# Patient Record
Sex: Female | Born: 1940 | Race: White | Hispanic: No | Marital: Married | Smoking: Never smoker
Health system: Southern US, Community
[De-identification: ages and names within clinical notes are randomized; demographics above are authoritative.]

## PROBLEM LIST (undated history)

## (undated) DIAGNOSIS — D649 Anemia, unspecified: Secondary | ICD-10-CM

## (undated) DIAGNOSIS — R112 Nausea with vomiting, unspecified: Secondary | ICD-10-CM

## (undated) DIAGNOSIS — K56609 Unspecified intestinal obstruction, unspecified as to partial versus complete obstruction: Secondary | ICD-10-CM

## (undated) DIAGNOSIS — C569 Malignant neoplasm of unspecified ovary: Secondary | ICD-10-CM

## (undated) DIAGNOSIS — M1990 Unspecified osteoarthritis, unspecified site: Secondary | ICD-10-CM

## (undated) DIAGNOSIS — H409 Unspecified glaucoma: Secondary | ICD-10-CM

## (undated) DIAGNOSIS — I729 Aneurysm of unspecified site: Secondary | ICD-10-CM

## (undated) DIAGNOSIS — R51 Headache: Secondary | ICD-10-CM

## (undated) DIAGNOSIS — M797 Fibromyalgia: Secondary | ICD-10-CM

## (undated) DIAGNOSIS — K219 Gastro-esophageal reflux disease without esophagitis: Secondary | ICD-10-CM

## (undated) DIAGNOSIS — R519 Headache, unspecified: Secondary | ICD-10-CM

## (undated) DIAGNOSIS — K5730 Diverticulosis of large intestine without perforation or abscess without bleeding: Secondary | ICD-10-CM

## (undated) DIAGNOSIS — G8929 Other chronic pain: Secondary | ICD-10-CM

## (undated) DIAGNOSIS — Z9889 Other specified postprocedural states: Secondary | ICD-10-CM

## (undated) DIAGNOSIS — I10 Essential (primary) hypertension: Secondary | ICD-10-CM

## (undated) HISTORY — DX: Other chronic pain: G89.29

## (undated) HISTORY — PX: ABDOMINAL HYSTERECTOMY: SHX81

## (undated) HISTORY — DX: Anemia, unspecified: D64.9

## (undated) HISTORY — PX: HERNIA REPAIR: SHX51

## (undated) HISTORY — DX: Headache, unspecified: R51.9

## (undated) HISTORY — DX: Diverticulosis of large intestine without perforation or abscess without bleeding: K57.30

## (undated) HISTORY — DX: Malignant neoplasm of unspecified ovary: C56.9

## (undated) HISTORY — DX: Headache: R51

## (undated) HISTORY — DX: Unspecified glaucoma: H40.9

## (undated) HISTORY — DX: Aneurysm of unspecified site: I72.9

## (undated) HISTORY — DX: Fibromyalgia: M79.7

## (undated) HISTORY — PX: BRAIN SURGERY: SHX531

## (undated) HISTORY — DX: Unspecified osteoarthritis, unspecified site: M19.90

## (undated) HISTORY — PX: CHOLECYSTECTOMY: SHX55

---
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
## 1999-09-09 HISTORY — PX: COLONOSCOPY W/ BIOPSIES: SHX1374

## 2001-09-29 ENCOUNTER — Encounter: Payer: Self-pay | Admitting: Emergency Medicine

## 2001-09-29 ENCOUNTER — Emergency Department (HOSPITAL_COMMUNITY): Admission: EM | Admit: 2001-09-29 | Discharge: 2001-09-29 | Payer: Self-pay | Admitting: Emergency Medicine

## 2008-04-02 DIAGNOSIS — K5730 Diverticulosis of large intestine without perforation or abscess without bleeding: Secondary | ICD-10-CM

## 2008-04-02 HISTORY — DX: Diverticulosis of large intestine without perforation or abscess without bleeding: K57.30

## 2008-04-02 HISTORY — PX: COLONOSCOPY: SHX174

## 2008-08-09 ENCOUNTER — Ambulatory Visit: Payer: Self-pay | Admitting: Rheumatology

## 2011-01-07 ENCOUNTER — Other Ambulatory Visit: Payer: Self-pay | Admitting: Neurology

## 2011-01-07 DIAGNOSIS — R2681 Unsteadiness on feet: Secondary | ICD-10-CM

## 2011-01-14 ENCOUNTER — Ambulatory Visit
Admission: RE | Admit: 2011-01-14 | Discharge: 2011-01-14 | Disposition: A | Discharge status: Home / Self Care | Payer: Medicare Other | Source: Ambulatory Visit | Attending: Neurology | Admitting: Neurology

## 2011-01-14 DIAGNOSIS — R2681 Unsteadiness on feet: Secondary | ICD-10-CM

## 2011-01-14 MED ORDER — GADOBENATE DIMEGLUMINE 529 MG/ML IV SOLN
19.0000 mL | Freq: Once | INTRAVENOUS | Status: AC | PRN
  Administered 2011-01-14: 19 mL via INTRAVENOUS

## 2011-01-15 ENCOUNTER — Other Ambulatory Visit: Payer: Self-pay | Admitting: Neurology

## 2011-01-15 DIAGNOSIS — I729 Aneurysm of unspecified site: Secondary | ICD-10-CM

## 2011-01-15 DIAGNOSIS — I671 Cerebral aneurysm, nonruptured: Secondary | ICD-10-CM

## 2011-01-18 ENCOUNTER — Ambulatory Visit
Admission: RE | Admit: 2011-01-18 | Discharge: 2011-01-18 | Disposition: A | Discharge status: Home / Self Care | Payer: Medicare Other | Source: Ambulatory Visit | Attending: Neurology | Admitting: Neurology

## 2011-01-18 DIAGNOSIS — I671 Cerebral aneurysm, nonruptured: Secondary | ICD-10-CM

## 2011-01-20 ENCOUNTER — Other Ambulatory Visit (HOSPITAL_COMMUNITY): Payer: Self-pay | Admitting: Neurology

## 2011-01-20 DIAGNOSIS — I729 Aneurysm of unspecified site: Secondary | ICD-10-CM

## 2011-01-23 ENCOUNTER — Ambulatory Visit (HOSPITAL_COMMUNITY)
Admission: RE | Admit: 2011-01-23 | Discharge: 2011-01-23 | Disposition: A | Discharge status: Home / Self Care | Payer: Medicare Other | Source: Ambulatory Visit | Attending: Neurology | Admitting: Neurology

## 2011-01-23 DIAGNOSIS — I729 Aneurysm of unspecified site: Secondary | ICD-10-CM

## 2011-01-30 ENCOUNTER — Other Ambulatory Visit (HOSPITAL_COMMUNITY): Payer: Self-pay | Admitting: Interventional Radiology

## 2011-01-30 DIAGNOSIS — I729 Aneurysm of unspecified site: Secondary | ICD-10-CM

## 2011-02-12 ENCOUNTER — Ambulatory Visit (HOSPITAL_COMMUNITY)
Admission: RE | Admit: 2011-02-12 | Discharge: 2011-02-12 | Disposition: A | Discharge status: Home / Self Care | Payer: Medicare Other | Source: Ambulatory Visit | Attending: Interventional Radiology | Admitting: Interventional Radiology

## 2011-02-12 ENCOUNTER — Encounter (HOSPITAL_COMMUNITY)
Admission: RE | Admit: 2011-02-12 | Discharge: 2011-02-12 | Disposition: A | Discharge status: Home / Self Care | Payer: Medicare Other | Source: Ambulatory Visit | Attending: Interventional Radiology | Admitting: Interventional Radiology

## 2011-02-12 ENCOUNTER — Other Ambulatory Visit (HOSPITAL_COMMUNITY): Payer: Self-pay | Admitting: Interventional Radiology

## 2011-02-12 DIAGNOSIS — Z01818 Encounter for other preprocedural examination: Secondary | ICD-10-CM | POA: Insufficient documentation

## 2011-02-12 DIAGNOSIS — R52 Pain, unspecified: Secondary | ICD-10-CM

## 2011-02-12 DIAGNOSIS — Z01812 Encounter for preprocedural laboratory examination: Secondary | ICD-10-CM | POA: Insufficient documentation

## 2011-02-12 LAB — PROTIME-INR
INR: 0.9 (ref 0.00–1.49)
Prothrombin Time: 12.4 seconds (ref 11.6–15.2)

## 2011-02-12 LAB — DIFFERENTIAL
Basophils Absolute: 0 10*3/uL (ref 0.0–0.1)
Basophils Relative: 1 % (ref 0–1)
Eosinophils Relative: 6 % — ABNORMAL HIGH (ref 0–5)
Monocytes Absolute: 0.4 10*3/uL (ref 0.1–1.0)
Neutro Abs: 3.4 10*3/uL (ref 1.7–7.7)

## 2011-02-12 LAB — BASIC METABOLIC PANEL
Calcium: 9.6 mg/dL (ref 8.4–10.5)
GFR calc Af Amer: >60 mL/min (ref >60)
GFR calc non Af Amer: >60 mL/min (ref >60)
Sodium: 138 mEq/L (ref 135–145)

## 2011-02-12 LAB — CBC
Hemoglobin: 14 g/dL (ref 12.0–15.0)
MCHC: 32.8 g/dL (ref 30.0–36.0)
RDW: 14.1 % (ref 11.5–15.5)

## 2011-02-12 LAB — APTT: aPTT: 28 seconds (ref 24–37)

## 2011-02-18 ENCOUNTER — Ambulatory Visit (HOSPITAL_COMMUNITY)
Admission: RE | Admit: 2011-02-18 | Discharge: 2011-02-18 | Disposition: A | Discharge status: Home / Self Care | Payer: Medicare Other | Source: Ambulatory Visit | Attending: Interventional Radiology | Admitting: Interventional Radiology

## 2011-02-18 ENCOUNTER — Inpatient Hospital Stay (HOSPITAL_COMMUNITY)
Admission: RE | Admit: 2011-02-18 | Discharge: 2011-02-19 | DRG: 027 | Disposition: A | Discharge status: Home / Self Care | Payer: Medicare Other | Source: Ambulatory Visit | Attending: Interventional Radiology | Admitting: Interventional Radiology

## 2011-02-18 DIAGNOSIS — Z8543 Personal history of malignant neoplasm of ovary: Secondary | ICD-10-CM

## 2011-02-18 DIAGNOSIS — I10 Essential (primary) hypertension: Secondary | ICD-10-CM | POA: Diagnosis present

## 2011-02-18 DIAGNOSIS — I671 Cerebral aneurysm, nonruptured: Principal | ICD-10-CM | POA: Diagnosis present

## 2011-02-18 DIAGNOSIS — K219 Gastro-esophageal reflux disease without esophagitis: Secondary | ICD-10-CM | POA: Diagnosis present

## 2011-02-18 DIAGNOSIS — I729 Aneurysm of unspecified site: Secondary | ICD-10-CM

## 2011-02-18 DIAGNOSIS — IMO0001 Reserved for inherently not codable concepts without codable children: Secondary | ICD-10-CM | POA: Diagnosis present

## 2011-02-18 DIAGNOSIS — Z96659 Presence of unspecified artificial knee joint: Secondary | ICD-10-CM

## 2011-02-18 LAB — CBC
MCH: 30.1 pg (ref 26.0–34.0)
MCV: 91.3 fL (ref 78.0–100.0)
Platelets: 187 10*3/uL (ref 150–400)
RDW: 13.8 % (ref 11.5–15.5)

## 2011-02-18 MED ORDER — IOHEXOL 300 MG/ML  SOLN: 150.0000 mL | Freq: Once | INTRAMUSCULAR | Status: DC | PRN

## 2011-02-18 MED ORDER — IOHEXOL 300 MG/ML  SOLN
400.0000 mL | Freq: Once | INTRAMUSCULAR | Status: AC | PRN
  Administered 2011-02-18: 140 mL via INTRA_ARTERIAL

## 2011-02-19 LAB — BASIC METABOLIC PANEL
BUN: 10 mg/dL (ref 6–23)
CO2: 26 mEq/L (ref 19–32)
Calcium: 7.8 mg/dL — ABNORMAL LOW (ref 8.4–10.5)
Creatinine, Ser: 0.7 mg/dL (ref 0.4–1.2)
GFR calc Af Amer: >60 mL/min (ref >60)

## 2011-02-19 LAB — PROTIME-INR: INR: 0.84 (ref 0.00–1.49)

## 2011-02-19 LAB — APTT: aPTT: 33 seconds (ref 24–37)

## 2011-02-19 LAB — POCT ACTIVATED CLOTTING TIME
Activated Clotting Time: 134 seconds
Activated Clotting Time: 164 seconds
Activated Clotting Time: 181 seconds

## 2011-02-19 LAB — CBC
MCHC: 32.5 g/dL (ref 30.0–36.0)
RDW: 13.7 % (ref 11.5–15.5)

## 2011-03-02 NOTE — Discharge Summary (Signed)
  Tonya Miranda, DRY           ACCOUNT NO.:  [id number]  MEDICAL RECORD NO.:  [id number]           PATIENT TYPE:  I  LOCATION:  3108                         FACILITY:  [facility]  PHYSICIAN:  Zaya Kessenich K. Klaus Casteneda, M.D.DATE OF BIRTH:  09/30/1941  DATE OF ADMISSION:  02/18/2011 DATE OF DISCHARGE:  02/19/2011                              DISCHARGE SUMMARY   REASON FOR HOSPITALIZATION:  Left internal carotid artery aneurysm.  The patient was referred to [name] by [name] for evaluation and treatment.  TREATMENT:  Left internal carotid artery aneurysm stent/coiling, performed by [name] on February 18, 2011.  Admitted to 3100 Neuro ICU overnight.  The patient's stay was without complication.  Morning of discharge, the patient stated she did have small or mild headache.  The patient states probably secondary to not sleeping well.  She is eating and drinking well without complications.  No nausea.  No vomiting.  No other complaints.  No other pain.  OBJECTIVE:  VITAL SIGNS:  Stable. HEART:  Regular rate and rhythm without murmur. LUNGS:  Clear to auscultation. ABDOMEN:  Positive bowel sounds, nontender, no masses. GU:  Right groin, no bleeding, no hematoma, no sign of infection.  LABS:  All within normal limits; potassium was 3.3 which was repleted.  DISCHARGE MEDICATIONS: 1. Advil 200 mg. 2. Beclomethasone nasal spray. 3. Calcium over-the-counter. 4. Ciclopirox. 5. Gabapentin 300 mg. 6. Multivitamins. 7. Omega-3 fish oil. 8. Prilosec 20 mg. 9. Tylenol. 10.Vitamin D3 2000 units. 11.Zyrtec 10 mg  INSTRUCTIONS: 1. Restful for 2-3 days. 2. No lifting for 2 weeks. 3. No driving for 2 weeks. 4. Continue all home medications.  DISCHARGE CONDITION:  Stable.  FOLLOWUP APPOINTMENT:  March 04, 2011 at 2 p.m. at [facility] with Dr. Julieanne Cotton.     [place] [name], P.A.   ______________________________ Grandville Silos Corliss Skains, M.D.    PAT/MEDQ  D:   02/19/2011  T:  02/20/2011  Job:  [id number]  Electronically Signed by Ralene Muskrat P.A. on 02/26/2011 11:40:37 AM Electronically Signed by Julieanne Cotton M.D. on 03/02/2011 01:45:50 PM

## 2011-03-02 NOTE — H&P (Signed)
  Tonya Miranda, Tonya Miranda           ACCOUNT NO.:  [id number]  MEDICAL RECORD NO.:  [id number]           PATIENT TYPE:  I  LOCATION:  3108                         FACILITY:  [facility]  PHYSICIAN:  Jemario Poitras K. Romen Yutzy, M.D.DATE OF BIRTH:  October 05, 1941  DATE OF ADMISSION:  02/18/2011 DATE OF DISCHARGE:                             HISTORY & PHYSICAL   SUBJECTIVE:  A 70 year old female patient of [name] referred to [name] for evaluation and treatment of cerebral aneurysm.  Consulted on January 26, 2011.  The patient now scheduled for cerebral arteriogram and probable stent/coiling of left internal carotid artery aneurysm.  The patient aware of procedure benefits and risks.  Agreeable to proceed.  PAST MEDICAL HISTORY: 1. Hypertension. 2. Reflux disease. 3. Ovarian cancer. 4. Stomach ulcers. 5. Cluster headaches. 6. Irritable bowel syndrome. 7. Fibromyalgia. 8. History of syncope.  SURGICAL HISTORY: 1. Cholecystectomy. 2. Hysterectomy. 3. Hernia repair. 4. Right knee replacement.  ALLERGIES:  ADHESIVE TAPE.  MEDICATIONS: 1. Prilosec 20 mg. 2. Zyrtec 10 mg. 3. Vitamin D3 2000 units. 4. Omega-3 fish oil. 5. Vitamin supplements. 6. Advil and Tylenol. 7. Calcium. 8. Gabapentin 300 mg. 9. Beclomethasone nasal spray. 10.Ciclopirox.  PHYSICAL EXAMINATION:  HEENT:  Extraocular movements intact; alert and oriented; appropriate.  Face symmetrical.  No facial droop.  Smile is equal bilaterally.  Tongue midline.  LUNGS:  Clear to auscultation.  HEART:  Regular rate and rhythm without murmur.  ABDOMEN:  Soft, positive bowel sounds and nontender.  EXTREMITIES:  Full range of motion; gait is slow.  ASSESSMENT:  Left internal carotid artery aneurysm.  PLAN:  Cerebral arteriogram and probable stent/coiling with [name].  Plan to admit to 3100 after procedure overnight.  Discharge on February 19, 2011 p.m. is stable.     Rogers [name],  P.A.   ______________________________ [place] Silos Corliss Skains, M.D.    PAT/MEDQ  D:  02/18/2011  T:  02/19/2011  Job:  [id number]  Electronically Signed by Ralene Muskrat P.A. on 02/26/2011 11:41:06 AM Electronically Signed by Julieanne Cotton M.D. on 03/02/2011 01:45:47 PM

## 2011-03-04 ENCOUNTER — Ambulatory Visit (HOSPITAL_COMMUNITY)
Admit: 2011-03-04 | Discharge: 2011-03-04 | Disposition: A | Discharge status: Home / Self Care | Payer: Medicare Other | Attending: Interventional Radiology | Admitting: Interventional Radiology

## 2011-07-27 ENCOUNTER — Other Ambulatory Visit (HOSPITAL_COMMUNITY): Payer: Self-pay | Admitting: Interventional Radiology

## 2011-07-27 DIAGNOSIS — I729 Aneurysm of unspecified site: Secondary | ICD-10-CM

## 2011-08-05 ENCOUNTER — Other Ambulatory Visit (HOSPITAL_COMMUNITY): Payer: Self-pay | Admitting: Interventional Radiology

## 2011-08-05 ENCOUNTER — Ambulatory Visit (HOSPITAL_COMMUNITY)
Admission: RE | Admit: 2011-08-05 | Discharge: 2011-08-05 | Disposition: A | Discharge status: Home / Self Care | Payer: Medicare Other | Source: Ambulatory Visit | Attending: Interventional Radiology | Admitting: Interventional Radiology

## 2011-08-05 DIAGNOSIS — R51 Headache: Secondary | ICD-10-CM | POA: Insufficient documentation

## 2011-08-05 DIAGNOSIS — I729 Aneurysm of unspecified site: Secondary | ICD-10-CM

## 2011-08-05 DIAGNOSIS — Z9889 Other specified postprocedural states: Secondary | ICD-10-CM | POA: Insufficient documentation

## 2011-08-05 DIAGNOSIS — I671 Cerebral aneurysm, nonruptured: Secondary | ICD-10-CM | POA: Insufficient documentation

## 2011-08-05 LAB — BASIC METABOLIC PANEL
BUN: 19 mg/dL (ref 6–23)
Calcium: 9.9 mg/dL (ref 8.4–10.5)
GFR calc non Af Amer: >60 mL/min (ref >60)
Glucose, Bld: 119 mg/dL — ABNORMAL HIGH (ref 70–99)
Sodium: 140 mEq/L (ref 135–145)

## 2011-08-05 LAB — CBC
MCH: 29.8 pg (ref 26.0–34.0)
MCHC: 33.5 g/dL (ref 30.0–36.0)
Platelets: 236 10*3/uL (ref 150–400)
RBC: 4.36 MIL/uL (ref 3.87–5.11)

## 2011-08-05 LAB — PROTIME-INR
INR: 0.9 (ref 0.00–1.49)
Prothrombin Time: 12.3 seconds (ref 11.6–15.2)

## 2011-08-05 MED ORDER — IOHEXOL 300 MG/ML  SOLN
150.0000 mL | Freq: Once | INTRAMUSCULAR | Status: AC | PRN
  Administered 2011-08-05: 50 mL via INTRA_ARTERIAL

## 2011-08-28 ENCOUNTER — Other Ambulatory Visit (HOSPITAL_COMMUNITY): Payer: Self-pay | Admitting: Interventional Radiology

## 2011-08-28 DIAGNOSIS — I729 Aneurysm of unspecified site: Secondary | ICD-10-CM

## 2011-08-28 DIAGNOSIS — Z09 Encounter for follow-up examination after completed treatment for conditions other than malignant neoplasm: Secondary | ICD-10-CM

## 2011-09-10 ENCOUNTER — Ambulatory Visit (HOSPITAL_COMMUNITY)
Admission: RE | Admit: 2011-09-10 | Discharge: 2011-09-10 | Disposition: A | Discharge status: Home / Self Care | Payer: Medicare Other | Source: Ambulatory Visit | Attending: Interventional Radiology | Admitting: Interventional Radiology

## 2011-09-10 DIAGNOSIS — Z09 Encounter for follow-up examination after completed treatment for conditions other than malignant neoplasm: Secondary | ICD-10-CM

## 2011-09-10 DIAGNOSIS — I729 Aneurysm of unspecified site: Secondary | ICD-10-CM

## 2011-11-26 ENCOUNTER — Other Ambulatory Visit (HOSPITAL_COMMUNITY): Payer: Self-pay | Admitting: Interventional Radiology

## 2011-11-26 DIAGNOSIS — Z09 Encounter for follow-up examination after completed treatment for conditions other than malignant neoplasm: Secondary | ICD-10-CM

## 2011-11-26 DIAGNOSIS — I671 Cerebral aneurysm, nonruptured: Secondary | ICD-10-CM

## 2011-12-24 ENCOUNTER — Ambulatory Visit (HOSPITAL_COMMUNITY)
Admission: RE | Admit: 2011-12-24 | Discharge: 2011-12-24 | Disposition: A | Discharge status: Home / Self Care | Payer: Medicare Other | Source: Ambulatory Visit | Attending: Interventional Radiology | Admitting: Interventional Radiology

## 2011-12-24 ENCOUNTER — Ambulatory Visit (HOSPITAL_COMMUNITY)
Admission: RE | Admit: 2011-12-24 | Discharge: 2011-12-24 | Payer: No Typology Code available for payment source | Source: Ambulatory Visit | Attending: Interventional Radiology | Admitting: Interventional Radiology

## 2011-12-24 DIAGNOSIS — I671 Cerebral aneurysm, nonruptured: Secondary | ICD-10-CM | POA: Insufficient documentation

## 2011-12-24 DIAGNOSIS — Z09 Encounter for follow-up examination after completed treatment for conditions other than malignant neoplasm: Secondary | ICD-10-CM

## 2011-12-24 MED ORDER — GADOBENATE DIMEGLUMINE 529 MG/ML IV SOLN
19.0000 mL | Freq: Once | INTRAVENOUS | Status: AC
  Administered 2011-12-24: 19 mL via INTRAVENOUS

## 2012-01-07 HISTORY — PX: ESOPHAGOGASTRODUODENOSCOPY: SHX1529

## 2012-04-05 ENCOUNTER — Telehealth (HOSPITAL_COMMUNITY): Payer: Self-pay

## 2012-04-05 NOTE — Telephone Encounter (Signed)
Tonya Miranda called in and stated that [name] told her it was time for her f/u w/ [name].  The pt did not remember having MRI/Mra in 12-24-11.  I refaxed a copy of the reports to [name] office and also called the office to make them aware that [name] had already completed a f/u procedure.  I also made them aware that he had stated that the next f/u mri/mra would be in Jan. 2014.

## 2012-05-31 ENCOUNTER — Other Ambulatory Visit: Payer: Self-pay | Admitting: Neurosurgery

## 2012-08-03 ENCOUNTER — Encounter (HOSPITAL_COMMUNITY): Payer: Self-pay | Admitting: Pharmacy Technician

## 2012-08-05 ENCOUNTER — Encounter (HOSPITAL_COMMUNITY): Payer: Self-pay

## 2012-08-05 ENCOUNTER — Ambulatory Visit (HOSPITAL_COMMUNITY)
Admission: RE | Admit: 2012-08-05 | Discharge: 2012-08-05 | Disposition: A | Discharge status: Home / Self Care | Payer: Medicare Other | Source: Ambulatory Visit | Attending: Neurosurgery | Admitting: Neurosurgery

## 2012-08-05 ENCOUNTER — Encounter (HOSPITAL_COMMUNITY)
Admission: RE | Admit: 2012-08-05 | Discharge: 2012-08-05 | Disposition: A | Discharge status: Home / Self Care | Payer: Medicare Other | Source: Ambulatory Visit | Attending: Neurosurgery | Admitting: Neurosurgery

## 2012-08-05 DIAGNOSIS — Z09 Encounter for follow-up examination after completed treatment for conditions other than malignant neoplasm: Secondary | ICD-10-CM | POA: Insufficient documentation

## 2012-08-05 DIAGNOSIS — Z9889 Other specified postprocedural states: Secondary | ICD-10-CM | POA: Insufficient documentation

## 2012-08-05 HISTORY — DX: Essential (primary) hypertension: I10

## 2012-08-05 HISTORY — DX: Gastro-esophageal reflux disease without esophagitis: K21.9

## 2012-08-05 HISTORY — DX: Nausea with vomiting, unspecified: R11.2

## 2012-08-05 HISTORY — DX: Other specified postprocedural states: R11.2

## 2012-08-05 HISTORY — DX: Other specified postprocedural states: Z98.890

## 2012-08-05 LAB — BASIC METABOLIC PANEL
BUN: 19 mg/dL (ref 6–23)
CO2: 24 mEq/L (ref 19–32)
Calcium: 10 mg/dL (ref 8.4–10.5)
Chloride: 104 mEq/L (ref 96–112)
Creatinine, Ser: 0.81 mg/dL (ref 0.50–1.10)
GFR calc Af Amer: 82 mL/min — ABNORMAL LOW (ref >90)
GFR calc non Af Amer: 71 mL/min — ABNORMAL LOW (ref >90)
Glucose, Bld: 86 mg/dL (ref 70–99)
Potassium: 4.5 mEq/L (ref 3.5–5.1)
Sodium: 139 mEq/L (ref 135–145)

## 2012-08-05 LAB — CBC
HCT: 36.1 % (ref 36.0–46.0)
Hemoglobin: 11.6 g/dL — ABNORMAL LOW (ref 12.0–15.0)
MCH: 26.9 pg (ref 26.0–34.0)
MCHC: 32.1 g/dL (ref 30.0–36.0)
MCV: 83.8 fL (ref 78.0–100.0)
Platelets: 296 10*3/uL (ref 150–400)
RBC: 4.31 MIL/uL (ref 3.87–5.11)
RDW: 17.3 % — ABNORMAL HIGH (ref 11.5–15.5)
WBC: 4.8 10*3/uL (ref 4.0–10.5)

## 2012-08-05 LAB — SURGICAL PCR SCREEN
MRSA, PCR: NEGATIVE
Staphylococcus aureus: NEGATIVE

## 2012-08-05 LAB — NO BLOOD PRODUCTS

## 2012-08-05 NOTE — Pre-Procedure Instructions (Signed)
20 KAMARIYAH TIMBERLAKE  08/05/2012  Your procedure is scheduled on:  August 10, 2012 at 0830 AM Wednesday   Report to [facility] at [phone].  Call this number if you have problems the morning of surgery: [phone]   Remember:   Do not eat food or drink any fluids:After Midnight.Tuesday      Take these medicines the morning of surgery with A SIP OF WATER: Tylenol,if needed, Zyrtec, Flonase nasal spray, bring with you,  And Protonix.     Do not wear jewelry, make-up or nail polish.  Do not wear lotions, powders, or perfumes. You may wear deodorant.  Do not shave 48 hours prior to surgery. Men may shave face and neck.  Do not bring valuables to the hospital.  Contacts, dentures or bridgework may not be worn into surgery.  Leave suitcase in the car. After surgery it may be brought to your room.  For patients admitted to the hospital, checkout time is 11:00 AM the day of discharge.   Patients discharged the day of surgery will not be allowed to drive home.    Special Instructions: CHG Shower Use Special Wash: 1/2 bottle night before surgery and 1/2 bottle morning of surgery.   Please read over the following fact sheets that you were given: Pain Booklet, Coughing and Deep Breathing, MRSA Information and Surgical Site Infection Prevention

## 2012-08-09 MED ORDER — CEFAZOLIN SODIUM-DEXTROSE 2-3 GM-% IV SOLR
2.0000 g | INTRAVENOUS | Status: AC
  Administered 2012-08-10: 2 g via INTRAVENOUS
  Filled 2012-08-09: qty 50

## 2012-08-09 NOTE — Consult Note (Signed)
Anesthesia chart: Patient is a 71 year old female scheduled for right retromastoid craniectomy and microvascular decompression for trigeminal neuralgia on 08/10/12 by Dr. Newell Coral.  History includes obesity, nonsmoker, postoperative nausea/vomiting, hypertension, GERD, diabetes mellitus type 2, ovarian cancer,OA, left ICA aneurysm s/p stent/coiling '12.  She is a Scientist, product/process development.    Labs noted.  H/H 11.6/36.1. NO BLOOD PRODUCTS.   CXR on 08/05/12 showed no active cardiopulmonary disease.  EKG on 08/05/12 showed NSR, possible LAE, LAD, LVH, cannot rule out septal infarct (age undetermined).  EKG was not felt significantly changed when compared to her EKG from 02/12/11.  (Possible anterior infarct was cited on prior EKG dating back to 09/29/01--see Muse.)  Anticipate she can proceed as planned.  Shonna Chock, PA-C

## 2012-08-10 ENCOUNTER — Ambulatory Visit (HOSPITAL_COMMUNITY): Payer: Medicare Other | Admitting: Vascular Surgery

## 2012-08-10 ENCOUNTER — Inpatient Hospital Stay (HOSPITAL_COMMUNITY): Payer: Medicare Other

## 2012-08-10 ENCOUNTER — Inpatient Hospital Stay (HOSPITAL_COMMUNITY)
Admission: RE | Admit: 2012-08-10 | Discharge: 2012-08-13 | DRG: 027 | Disposition: A | Discharge status: Home / Self Care | Payer: Medicare Other | Source: Ambulatory Visit | Attending: Neurosurgery | Admitting: Neurosurgery

## 2012-08-10 ENCOUNTER — Encounter (HOSPITAL_COMMUNITY): Payer: Self-pay | Admitting: *Deleted

## 2012-08-10 ENCOUNTER — Encounter (HOSPITAL_COMMUNITY): Payer: Self-pay | Admitting: Vascular Surgery

## 2012-08-10 ENCOUNTER — Encounter (HOSPITAL_COMMUNITY)
Admission: RE | Disposition: A | Discharge status: Home / Self Care | Payer: Self-pay | Source: Ambulatory Visit | Attending: Neurosurgery

## 2012-08-10 DIAGNOSIS — G500 Trigeminal neuralgia: Principal | ICD-10-CM | POA: Diagnosis present

## 2012-08-10 DIAGNOSIS — Z79899 Other long term (current) drug therapy: Secondary | ICD-10-CM

## 2012-08-10 DIAGNOSIS — Z8543 Personal history of malignant neoplasm of ovary: Secondary | ICD-10-CM

## 2012-08-10 DIAGNOSIS — E669 Obesity, unspecified: Secondary | ICD-10-CM | POA: Diagnosis present

## 2012-08-10 DIAGNOSIS — M1990 Unspecified osteoarthritis, unspecified site: Secondary | ICD-10-CM | POA: Diagnosis present

## 2012-08-10 DIAGNOSIS — K219 Gastro-esophageal reflux disease without esophagitis: Secondary | ICD-10-CM | POA: Diagnosis present

## 2012-08-10 DIAGNOSIS — I10 Essential (primary) hypertension: Secondary | ICD-10-CM | POA: Diagnosis present

## 2012-08-10 DIAGNOSIS — E119 Type 2 diabetes mellitus without complications: Secondary | ICD-10-CM | POA: Diagnosis present

## 2012-08-10 HISTORY — PX: CRANIECTOMY: SHX331

## 2012-08-10 LAB — GLUCOSE, CAPILLARY
Glucose-Capillary: 117 mg/dL — ABNORMAL HIGH (ref 70–99)
Glucose-Capillary: 155 mg/dL — ABNORMAL HIGH (ref 70–99)

## 2012-08-10 SURGERY — CRANIECTOMY FOR TIC DOULOUREUX
Anesthesia: General | Laterality: Right | Wound class: Clean

## 2012-08-10 MED ORDER — DEXAMETHASONE SODIUM PHOSPHATE 10 MG/ML IJ SOLN
6.0000 mg | Freq: Four times a day (QID) | INTRAMUSCULAR | Status: DC
  Administered 2012-08-10 – 2012-08-11 (×3): 6 mg via INTRAVENOUS
  Filled 2012-08-10 (×3): qty 0.6
  Filled 2012-08-10: qty 1
  Filled 2012-08-10: qty 0.6

## 2012-08-10 MED ORDER — HYDRALAZINE HCL 20 MG/ML IJ SOLN: 5.0000 mg | INTRAMUSCULAR | Status: DC | PRN

## 2012-08-10 MED ORDER — DIPHENHYDRAMINE HCL 25 MG PO TABS: 25.0000 mg | ORAL_TABLET | ORAL | Status: DC | PRN

## 2012-08-10 MED ORDER — PANTOPRAZOLE SODIUM 40 MG IV SOLR
40.0000 mg | Freq: Every day | INTRAVENOUS | Status: DC
  Administered 2012-08-10 – 2012-08-11 (×2): 40 mg via INTRAVENOUS
  Filled 2012-08-10 (×3): qty 40

## 2012-08-10 MED ORDER — SODIUM CHLORIDE 0.9 % IV SOLN: INTRAVENOUS | Status: DC

## 2012-08-10 MED ORDER — LISINOPRIL 10 MG PO TABS
10.0000 mg | ORAL_TABLET | Freq: Every day | ORAL | Status: DC
  Administered 2012-08-11 – 2012-08-13 (×3): 10 mg via ORAL
  Filled 2012-08-10 (×3): qty 1

## 2012-08-10 MED ORDER — PHENYLEPHRINE HCL 10 MG/ML IJ SOLN
10.0000 mg | INTRAVENOUS | Status: DC | PRN
  Administered 2012-08-10: 1 ug/min via INTRAVENOUS

## 2012-08-10 MED ORDER — BISACODYL 10 MG RE SUPP: 10.0000 mg | Freq: Every day | RECTAL | Status: DC | PRN

## 2012-08-10 MED ORDER — MIDAZOLAM HCL 5 MG/5ML IJ SOLN
INTRAMUSCULAR | Status: DC | PRN
  Administered 2012-08-10 (×2): 1 mg via INTRAVENOUS

## 2012-08-10 MED ORDER — LORATADINE 10 MG PO TABS
10.0000 mg | ORAL_TABLET | Freq: Every day | ORAL | Status: DC
  Administered 2012-08-11 – 2012-08-12 (×2): 10 mg via ORAL
  Filled 2012-08-10 (×3): qty 1

## 2012-08-10 MED ORDER — SUFENTANIL CITRATE 50 MCG/ML IV SOLN
INTRAVENOUS | Status: DC | PRN
  Administered 2012-08-10 (×5): 10 ug via INTRAVENOUS

## 2012-08-10 MED ORDER — ROCURONIUM BROMIDE 100 MG/10ML IV SOLN
INTRAVENOUS | Status: DC | PRN
  Administered 2012-08-10: 50 mg via INTRAVENOUS

## 2012-08-10 MED ORDER — DROPERIDOL 2.5 MG/ML IJ SOLN
INTRAMUSCULAR | Status: DC | PRN
  Administered 2012-08-10: 0.625 mg via INTRAVENOUS

## 2012-08-10 MED ORDER — PROPOFOL 10 MG/ML IV BOLUS
INTRAVENOUS | Status: DC | PRN
  Administered 2012-08-10: 140 mg via INTRAVENOUS
  Administered 2012-08-10: 40 mg via INTRAVENOUS

## 2012-08-10 MED ORDER — LABETALOL HCL 5 MG/ML IV SOLN
5.0000 mg | INTRAVENOUS | Status: DC | PRN
  Administered 2012-08-10 (×2): 10 mg via INTRAVENOUS

## 2012-08-10 MED ORDER — DEXAMETHASONE SODIUM PHOSPHATE 4 MG/ML IJ SOLN
4.0000 mg | Freq: Four times a day (QID) | INTRAMUSCULAR | Status: DC
  Filled 2012-08-10 (×3): qty 1

## 2012-08-10 MED ORDER — FAMOTIDINE IN NACL 20-0.9 MG/50ML-% IV SOLN
20.0000 mg | INTRAVENOUS | Status: DC
  Filled 2012-08-10: qty 50

## 2012-08-10 MED ORDER — LABETALOL HCL 5 MG/ML IV SOLN
INTRAVENOUS | Status: AC
  Filled 2012-08-10: qty 4

## 2012-08-10 MED ORDER — HYDROXYZINE HCL 25 MG PO TABS
50.0000 mg | ORAL_TABLET | ORAL | Status: DC | PRN
  Filled 2012-08-10: qty 1

## 2012-08-10 MED ORDER — HYDROXYZINE HCL 50 MG/ML IM SOLN
50.0000 mg | INTRAMUSCULAR | Status: DC | PRN
  Filled 2012-08-10: qty 1

## 2012-08-10 MED ORDER — BUPIVACAINE HCL (PF) 0.25 % IJ SOLN
INTRAMUSCULAR | Status: DC | PRN
  Administered 2012-08-10: 10 mL

## 2012-08-10 MED ORDER — GLYCOPYRROLATE 0.2 MG/ML IJ SOLN
INTRAMUSCULAR | Status: DC | PRN
  Administered 2012-08-10: 0.6 mg via INTRAVENOUS
  Administered 2012-08-10: 0.2 mg via INTRAVENOUS

## 2012-08-10 MED ORDER — SODIUM CHLORIDE 0.9 % IV SOLN
INTRAVENOUS | Status: DC | PRN
  Administered 2012-08-10 (×2): via INTRAVENOUS

## 2012-08-10 MED ORDER — METFORMIN HCL ER 500 MG PO TB24
500.0000 mg | ORAL_TABLET | Freq: Every day | ORAL | Status: DC
  Administered 2012-08-11 – 2012-08-13 (×3): 500 mg via ORAL
  Filled 2012-08-10 (×5): qty 1

## 2012-08-10 MED ORDER — SODIUM CHLORIDE 0.9 % IV SOLN
INTRAVENOUS | Status: DC | PRN
  Administered 2012-08-10: 09:00:00 via INTRAVENOUS

## 2012-08-10 MED ORDER — MICROFIBRILLAR COLL HEMOSTAT EX PADS
MEDICATED_PAD | CUTANEOUS | Status: DC | PRN
  Administered 2012-08-10: 1 via TOPICAL

## 2012-08-10 MED ORDER — SODIUM CHLORIDE 0.9 % IV SOLN
INTRAVENOUS | Status: AC
  Filled 2012-08-10: qty 500

## 2012-08-10 MED ORDER — MAGNESIUM HYDROXIDE 400 MG/5ML PO SUSP
30.0000 mL | Freq: Every day | ORAL | Status: DC | PRN
  Administered 2012-08-11 – 2012-08-12 (×2): 30 mL via ORAL
  Filled 2012-08-10 (×2): qty 30

## 2012-08-10 MED ORDER — ONDANSETRON HCL 4 MG/2ML IJ SOLN
INTRAMUSCULAR | Status: DC | PRN
  Administered 2012-08-10: 4 mg via INTRAVENOUS

## 2012-08-10 MED ORDER — HYDROMORPHONE HCL PF 1 MG/ML IJ SOLN
0.5000 mg | INTRAMUSCULAR | Status: DC | PRN
  Administered 2012-08-10: 1 mg via INTRAVENOUS
  Administered 2012-08-10: 0.5 mg via INTRAVENOUS
  Administered 2012-08-11 (×2): 1 mg via INTRAVENOUS
  Filled 2012-08-10 (×5): qty 1

## 2012-08-10 MED ORDER — PANTOPRAZOLE SODIUM 40 MG PO TBEC
40.0000 mg | DELAYED_RELEASE_TABLET | Freq: Every day | ORAL | Status: DC
  Administered 2012-08-11 – 2012-08-12 (×2): 40 mg via ORAL
  Filled 2012-08-10 (×2): qty 1

## 2012-08-10 MED ORDER — LIDOCAINE-EPINEPHRINE 1 %-1:100000 IJ SOLN
INTRAMUSCULAR | Status: DC | PRN
  Administered 2012-08-10: 10 mL

## 2012-08-10 MED ORDER — ONDANSETRON HCL 4 MG/2ML IJ SOLN
4.0000 mg | INTRAMUSCULAR | Status: DC | PRN
  Administered 2012-08-11: 4 mg via INTRAVENOUS
  Filled 2012-08-10: qty 2

## 2012-08-10 MED ORDER — DEXAMETHASONE SODIUM PHOSPHATE 10 MG/ML IJ SOLN
INTRAMUSCULAR | Status: DC | PRN
  Administered 2012-08-10: 10 mg via INTRAVENOUS

## 2012-08-10 MED ORDER — VECURONIUM BROMIDE 10 MG IV SOLR
INTRAVENOUS | Status: DC | PRN
  Administered 2012-08-10: 3 mg via INTRAVENOUS
  Administered 2012-08-10: 1 mg via INTRAVENOUS

## 2012-08-10 MED ORDER — FLUTICASONE PROPIONATE 50 MCG/ACT NA SUSP
2.0000 | Freq: Every day | NASAL | Status: DC
  Administered 2012-08-11: 2 via NASAL
  Administered 2012-08-12: 1 via NASAL
  Filled 2012-08-10: qty 16

## 2012-08-10 MED ORDER — HEMOSTATIC AGENTS (NO CHARGE) OPTIME
TOPICAL | Status: DC | PRN
  Administered 2012-08-10: 1 via TOPICAL

## 2012-08-10 MED ORDER — 0.9 % SODIUM CHLORIDE (POUR BTL) OPTIME
TOPICAL | Status: DC | PRN
  Administered 2012-08-10: 2000 mL

## 2012-08-10 MED ORDER — NEOSTIGMINE METHYLSULFATE 1 MG/ML IJ SOLN
INTRAMUSCULAR | Status: DC | PRN
  Administered 2012-08-10: 5 mg via INTRAVENOUS

## 2012-08-10 MED ORDER — OXYCODONE HCL 5 MG PO TABS: 5.0000 mg | ORAL_TABLET | Freq: Once | ORAL | Status: DC | PRN

## 2012-08-10 MED ORDER — THROMBIN 20000 UNITS EX KIT
PACK | CUTANEOUS | Status: DC | PRN
  Administered 2012-08-10: 20000 [IU] via TOPICAL

## 2012-08-10 MED ORDER — BACITRACIN ZINC 500 UNIT/GM EX OINT
TOPICAL_OINTMENT | CUTANEOUS | Status: DC | PRN
  Administered 2012-08-10: 1 via TOPICAL

## 2012-08-10 MED ORDER — ONDANSETRON HCL 4 MG PO TABS: 4.0000 mg | ORAL_TABLET | ORAL | Status: DC | PRN

## 2012-08-10 MED ORDER — OXYCODONE HCL 5 MG/5ML PO SOLN: 5.0000 mg | Freq: Once | ORAL | Status: DC | PRN

## 2012-08-10 MED ORDER — DEXAMETHASONE SODIUM PHOSPHATE 4 MG/ML IJ SOLN: 4.0000 mg | Freq: Three times a day (TID) | INTRAMUSCULAR | Status: DC

## 2012-08-10 MED ORDER — HYDROMORPHONE HCL PF 1 MG/ML IJ SOLN: 0.2500 mg | INTRAMUSCULAR | Status: DC | PRN

## 2012-08-10 MED ORDER — HEPARIN SODIUM (PORCINE) 1000 UNIT/ML IJ SOLN
INTRAMUSCULAR | Status: AC
  Filled 2012-08-10: qty 1

## 2012-08-10 MED ORDER — AMITRIPTYLINE HCL 50 MG PO TABS
50.0000 mg | ORAL_TABLET | Freq: Every day | ORAL | Status: DC
  Administered 2012-08-11 – 2012-08-12 (×2): 50 mg via ORAL
  Filled 2012-08-10 (×4): qty 1

## 2012-08-10 MED ORDER — METHYLENE BLUE 1 % INJ SOLN
INTRAMUSCULAR | Status: DC | PRN
  Administered 2012-08-10: 10 mL

## 2012-08-10 MED ORDER — SODIUM CHLORIDE 0.9 % IR SOLN
Status: DC | PRN
  Administered 2012-08-10: 11:00:00

## 2012-08-10 MED ORDER — BACITRACIN 50000 UNITS IM SOLR
INTRAMUSCULAR | Status: AC
  Filled 2012-08-10: qty 1

## 2012-08-10 SURGICAL SUPPLY — 74 items
APPLICATOR COTTON TIP 6IN STRL (MISCELLANEOUS) ×2 IMPLANT
BAG DECANTER FOR FLEXI CONT (MISCELLANEOUS) ×2 IMPLANT
BANDAGE GAUZE 4  KLING STR (GAUZE/BANDAGES/DRESSINGS) IMPLANT
BLADE ULTRA TIP 2M (BLADE) IMPLANT
BRUSH SCRUB EZ PLAIN DRY (MISCELLANEOUS) ×2 IMPLANT
BUR ACORN 6.0 PRECISION (BURR) ×2 IMPLANT
BUR MATCHSTICK NEURO 3.0 LAGG (BURR) ×1 IMPLANT
CANISTER SUCTION 2500CC (MISCELLANEOUS) ×2 IMPLANT
CLIP TI MEDIUM 6 (CLIP) ×1 IMPLANT
CLOTH BEACON ORANGE TIMEOUT ST (SAFETY) ×2 IMPLANT
CONT SPEC 4OZ CLIKSEAL STRL BL (MISCELLANEOUS) ×2 IMPLANT
CORDS BIPOLAR (ELECTRODE) ×2 IMPLANT
DRAIN SNY WOU 7FLT (WOUND CARE) IMPLANT
DRAPE MICROSCOPE LEICA (MISCELLANEOUS) ×2 IMPLANT
DRAPE NEUROLOGICAL W/INCISE (DRAPES) ×2 IMPLANT
DRAPE SURG 17X23 STRL (DRAPES) IMPLANT
DRAPE WARM FLUID 44X44 (DRAPE) ×2 IMPLANT
DRSG ADAPTIC 3X8 NADH LF (GAUZE/BANDAGES/DRESSINGS) ×1 IMPLANT
ELECT CAUTERY BLADE 6.4 (BLADE) ×2 IMPLANT
ELECT REM PT RETURN 9FT ADLT (ELECTROSURGICAL) ×2
ELECTRODE REM PT RTRN 9FT ADLT (ELECTROSURGICAL) ×1 IMPLANT
EVACUATOR 1/8 PVC DRAIN (DRAIN) IMPLANT
FELT TEFLON 6X6 (MISCELLANEOUS) ×2 IMPLANT
GAUZE SPONGE 4X4 16PLY XRAY LF (GAUZE/BANDAGES/DRESSINGS) IMPLANT
GLOVE BIOGEL M 8.0 STRL (GLOVE) ×1 IMPLANT
GLOVE BIOGEL PI IND STRL 7.5 (GLOVE) IMPLANT
GLOVE BIOGEL PI IND STRL 8 (GLOVE) ×1 IMPLANT
GLOVE BIOGEL PI INDICATOR 7.5 (GLOVE) ×1
GLOVE BIOGEL PI INDICATOR 8 (GLOVE) ×3
GLOVE ECLIPSE 7.5 STRL STRAW (GLOVE) ×5 IMPLANT
GLOVE EXAM NITRILE LRG STRL (GLOVE) IMPLANT
GLOVE EXAM NITRILE MD LF STRL (GLOVE) IMPLANT
GLOVE EXAM NITRILE XL STR (GLOVE) IMPLANT
GLOVE EXAM NITRILE XS STR PU (GLOVE) IMPLANT
GOWN BRE IMP SLV AUR LG STRL (GOWN DISPOSABLE) IMPLANT
GOWN BRE IMP SLV AUR XL STRL (GOWN DISPOSABLE) ×2 IMPLANT
GOWN STRL REIN 2XL LVL4 (GOWN DISPOSABLE) ×2 IMPLANT
HEMOSTAT SURGICEL 2X14 (HEMOSTASIS) ×2 IMPLANT
HOOK DURA (MISCELLANEOUS) ×2 IMPLANT
KIT BASIN OR (CUSTOM PROCEDURE TRAY) ×2 IMPLANT
KIT ROOM TURNOVER OR (KITS) ×2 IMPLANT
NDL HYPO 25X1 1.5 SAFETY (NEEDLE) IMPLANT
NEEDLE HYPO 25X1 1.5 SAFETY (NEEDLE) ×2 IMPLANT
NS IRRIG 1000ML POUR BTL (IV SOLUTION) ×3 IMPLANT
PACK CRANIOTOMY (CUSTOM PROCEDURE TRAY) ×2 IMPLANT
PAD ARMBOARD 7.5X6 YLW CONV (MISCELLANEOUS) ×6 IMPLANT
PATTIES SURGICAL .25X.25 (GAUZE/BANDAGES/DRESSINGS) ×3 IMPLANT
PATTIES SURGICAL .5 X.5 (GAUZE/BANDAGES/DRESSINGS) IMPLANT
PATTIES SURGICAL .5 X3 (DISPOSABLE) IMPLANT
PATTIES SURGICAL 1/4 X 3 (GAUZE/BANDAGES/DRESSINGS) ×2 IMPLANT
PATTIES SURGICAL 1X1 (DISPOSABLE) IMPLANT
PATTIES SURGICAL 3 X3 (GAUZE/BANDAGES/DRESSINGS)
PATTIES SURGICAL 3X3 (GAUZE/BANDAGES/DRESSINGS) IMPLANT
PIN MAYFIELD SKULL DISP (PIN) IMPLANT
RUBBERBAND STERILE (MISCELLANEOUS) ×4 IMPLANT
SPONGE GAUZE 4X4 12PLY (GAUZE/BANDAGES/DRESSINGS) ×1 IMPLANT
SPONGE NEURO XRAY DETECT 1X3 (DISPOSABLE) IMPLANT
SPONGE SURGIFOAM ABS GEL 100 (HEMOSTASIS) ×2 IMPLANT
STAPLER SKIN PROX WIDE 3.9 (STAPLE) ×2 IMPLANT
SUT BONE WAX W31G (SUTURE) IMPLANT
SUT ETHILON 3 0 FSL (SUTURE) ×1 IMPLANT
SUT NURALON 4 0 TR CR/8 (SUTURE) ×4 IMPLANT
SUT VIC AB 0 CT1 18XCR BRD8 (SUTURE) ×1 IMPLANT
SUT VIC AB 0 CT1 8-18 (SUTURE) ×2
SUT VIC AB 2-0 CP2 18 (SUTURE) ×3 IMPLANT
SYR 20ML ECCENTRIC (SYRINGE) ×1 IMPLANT
SYR CONTROL 10ML LL (SYRINGE) ×2 IMPLANT
TAPE CLOTH SURG 4X10 WHT LF (GAUZE/BANDAGES/DRESSINGS) ×1 IMPLANT
TOWEL OR 17X24 6PK STRL BLUE (TOWEL DISPOSABLE) ×2 IMPLANT
TOWEL OR 17X26 10 PK STRL BLUE (TOWEL DISPOSABLE) ×2 IMPLANT
TRAY FOLEY CATH 14FRSI W/METER (CATHETERS) ×2 IMPLANT
TUBE CONNECTING 12X1/4 (SUCTIONS) ×2 IMPLANT
UNDERPAD 30X30 INCONTINENT (UNDERPADS AND DIAPERS) ×1 IMPLANT
WATER STERILE IRR 1000ML POUR (IV SOLUTION) ×2 IMPLANT

## 2012-08-10 NOTE — H&P (Signed)
Subjective: Patient is a 71 y.o. female who is admitted for treatment of right trigeminal neuralgia. Patient had symptoms began several years ago. She's been under medical management for a year or 2, but has become less effective for her and many the medications cause significant side effects for her. She is admitted now for a right retromastoid craniectomy and microvascular decompression of the trigeminal nerve.   Past Medical History  Diagnosis Date  . PONV (postoperative nausea and vomiting)   . Hypertension   . Diabetes mellitus   . GERD (gastroesophageal reflux disease)   . Cancer     Ovarian CA  . Arthritis     osteoarthritis    Past Surgical History  Procedure Date  . Brain surgery     aneurysm  . Hernia repair     left inguinal repair and adhesion repair  . Abdominal hysterectomy     ovarian cancer  . Cholecystectomy     Prescriptions prior to admission  Medication Sig Dispense Refill  . acetaminophen (TYLENOL) 500 MG tablet Take 500 mg by mouth every 6 (six) hours as needed. For pain.      Marland Kitchen amitriptyline (ELAVIL) 25 MG tablet Take 50 mg by mouth at bedtime.      . Bilberry, Vaccinium myrtillus, (BILBERRY PO) Take 1 tablet by mouth every evening.      . cetirizine (ZYRTEC) 10 MG tablet Take 10 mg by mouth daily.      . Cholecalciferol (VITAMIN D-3 PO) Take 1 tablet by mouth daily.      . diphenhydrAMINE (BENADRYL) 25 MG tablet Take 25 mg by mouth as needed. For allergies.      . fluticasone (FLONASE) 50 MCG/ACT nasal spray Place 2 sprays into the nose daily.      Marland Kitchen ibuprofen (ADVIL,MOTRIN) 200 MG tablet Take 200 mg by mouth every 6 (six) hours as needed. For pain.      Marland Kitchen lisinopril (PRINIVIL,ZESTRIL) 10 MG tablet Take 10 mg by mouth daily.      . metFORMIN (GLUCOPHAGE-XR) 500 MG 24 hr tablet Take 500 mg by mouth daily with breakfast.      . Omega-3 Fatty Acids (FISH OIL PO) Take 2 tablets by mouth every evening.      . pantoprazole (PROTONIX) 40 MG tablet Take 40 mg by  mouth daily.      . metoCLOPramide (REGLAN) 10 MG tablet Take 10 mg by mouth as needed. For reflux.      . Multiple Vitamin (MULTIVITAMIN WITH MINERALS) TABS Take 1 tablet by mouth daily.       Allergies  Allergen Reactions  . Morphine And Related     Hallucinations, headache    History  Substance Use Topics  . Smoking status: Never Smoker   . Smokeless tobacco: Not on file  . Alcohol Use: Yes     wine rarely    History reviewed. No pertinent family history.   Review of Systems A comprehensive review of systems was negative.  Objective: Vital signs in last 24 hours: Temp:  [97 F (36.1 C)] 97 F (36.1 C) (09/04 0758) Pulse Rate:  [93] 93  (09/04 0758) Resp:  [16] 16  (09/04 0758) BP: (128)/(80) 128/80 mmHg (09/04 0758) SpO2:  [97 %] 97 % (09/04 0758)  EXAM: Patient is a well-developed well-nourished white female in no acute distress. Lungs are clear to auscultation , the patient has symmetrical respiratory excursion. Heart has a regular rate and rhythm normal S1 and S2 no murmur.  Abdomen is soft nontender nondistended bowel sounds are present. Extremity examination shows no clubbing cyanosis or edema. Mental status shows the patient is awake, alert, and oriented. Speech is fluent. She has good comprehension. Cranial nerves show pupils are equal, round, and reactive to light. EOMs are intact. Facial is packed both light touch and pinprick. Facial movement is symmetrical. Hearing is present bilaterally. Palatal movement is symmetrical. Shoulder shrug is symmetrical. Tongue is midline. Motor examination shows 5 over 5 strength in the upper and lower extremities. Sensation is intact to pinprick throughout the extremities. Reflexes are trace to one in the biceps, triceps, brachialis, quadriceps, and gastrocnemius, and are symmetrical bilaterally. She has a normal gait and stance.   Data Review:CBC    Component Value Date/Time   WBC 4.8 08/05/2012 1130   RBC 4.31 08/05/2012 1130    HGB 11.6* 08/05/2012 1130   HCT 36.1 08/05/2012 1130   PLT 296 08/05/2012 1130   MCV 83.8 08/05/2012 1130   MCH 26.9 08/05/2012 1130   MCHC 32.1 08/05/2012 1130   RDW 17.3* 08/05/2012 1130   LYMPHSABS 1.3 02/12/2011 0904   MONOABS 0.4 02/12/2011 0904   EOSABS 0.3 02/12/2011 0904   BASOSABS 0.0 02/12/2011 0904                          BMET    Component Value Date/Time   NA 139 08/05/2012 1130   K 4.5 08/05/2012 1130   CL 104 08/05/2012 1130   CO2 24 08/05/2012 1130   GLUCOSE 86 08/05/2012 1130   BUN 19 08/05/2012 1130   CREATININE 0.81 08/05/2012 1130   CALCIUM 10.0 08/05/2012 1130   GFRNONAA 71* 08/05/2012 1130   GFRAA 82* 08/05/2012 1130     Assessment/Plan: Patient is admitted now for right retromastoid craniectomy and microvascular decompression of the trigeminal nerve. I've discussed with the patient the nature of her condition, the nature the surgical procedure, and risks of surgery including risk of infection, bleeding, the risk of  brainstem dysfunction including double vision, blurred vision, loss of this facial sensation, loss of facial movement, loss of hearing, difficulty swallowing, paralysis of the extremities, coma, and death but also discussed risks of CSF leak, possible need for further surgery, and anesthetic was myocardial infarction, stroke, pneumonia, and death. Understanding all this she wishes to proceed with surgery.   Hewitt Shorts, MD 08/10/2012 8:10 AM

## 2012-08-10 NOTE — Anesthesia Postprocedure Evaluation (Signed)
  Anesthesia Post-op Note  Patient: Tonya Miranda  Procedure(s) Performed: Procedure(s) (LRB) with comments: CRANIECTOMY FOR TIC DOULOUREUX (Right) - Right Retromastoid Craniectomy with Microvascular Decompression, Decompression of the trigeminal nerve  Patient Location: PACU  Anesthesia Type: General  Level of Consciousness: awake  Airway and Oxygen Therapy: Patient Spontanous Breathing and Patient connected to face mask oxygen  Post-op Pain: none  Post-op Assessment: Post-op Vital signs reviewed, Patient's Cardiovascular Status Stable, Respiratory Function Stable, Patent Airway and No signs of Nausea or vomiting  Post-op Vital Signs: Reviewed and stable  Complications: No apparent anesthesia complications

## 2012-08-10 NOTE — Progress Notes (Signed)
Subjective: Patient comfortable. Dressing clean and dry. No facial pain. Mild anxiety.  Objective: Vital signs in last 24 hours: Filed Vitals:   08/10/12 1530 08/10/12 1540 08/10/12 1545 08/10/12 1600  BP:  137/72  149/80  Pulse: 70 72  74  Temp:      TempSrc:      Resp: 14 18  22   Height:   5\' 1"  (1.549 m)   Weight:   94.5 kg (208 lb 5.4 oz)   SpO2: 94% 97%  97%    Intake/Output from previous day:   Intake/Output this shift: Total I/O In: 1650 [I.V.:1300; Other:350] Out: 500 [Urine:450; Blood:50]  Physical Exam:  Awake and alert, oriented. Pupils 3 mm, bilaterally, round and reactive to light. EOMs intact. Facial movements symmetrical. Tongue midline. Moving all 4 extremity is well. No drift of upper extremities.   Studies/Results: Dg Chest Port 1 View  08/10/2012  *RADIOLOGY REPORT*  Clinical Data: Central line placement.  PORTABLE CHEST - 1 VIEW  Comparison: 08/05/2012  Findings: A left subclavian catheter has been inserted and the tip is in the superior vena cava with the tip directed toward the side wall of the SVC. No pneumothorax.  There is bilateral slight perihilar atelectasis, left greater than right with slight atelectasis at the left base.  IMPRESSION: Central line tip is in the superior vena cava directed toward the side wall.  No pneumothorax.  Bilateral atelectasis.   Original Report Authenticated By: Gwynn Burly, M.D.     Assessment/Plan: Doing well following surgery. We'll plan on resuming by mouth in a.m. if she continues to do well. Spoke with patient, her husband, and her daughter.   Hewitt Shorts, MD 08/10/2012, 4:32 PM

## 2012-08-10 NOTE — Preoperative (Signed)
Beta Blockers   Reason not to administer Beta Blockers:Not Applicable 

## 2012-08-10 NOTE — Transfer of Care (Signed)
Immediate Anesthesia Transfer of Care Note  Patient: Tonya Miranda  Procedure(s) Performed: Procedure(s) (LRB) with comments: CRANIECTOMY FOR TIC DOULOUREUX (Right) - Right Retromastoid Craniectomy with Microvascular Decompression, Decompression of the trigeminal nerve  Patient Location: PACU  Anesthesia Type: General  Level of Consciousness: sedated  Airway & Oxygen Therapy: Patient Spontanous Breathing and Patient connected to face mask oxygen  Post-op Assessment: Report given to PACU RN and Post -op Vital signs reviewed and stable  Post vital signs: Reviewed and stable  Complications: No apparent anesthesia complications

## 2012-08-10 NOTE — Progress Notes (Signed)
Orthopedic Tech Progress Note Patient Details:  Tonya Miranda [birth year] [id number]  Ortho Devices Type of Ortho Device: Other (comment) Ortho Device/Splint Location: LEFT [name] Ortho Device/Splint Interventions: [name] 08/10/2012, 12:06 PM

## 2012-08-10 NOTE — Progress Notes (Signed)
Filed Vitals:   08/10/12 0758 08/10/12 1214 08/10/12 1230 08/10/12 1245  BP: 128/80     Pulse: 93  81 82  Temp: 97 F (36.1 C) 96.8 F (36 C)    TempSrc: Oral     Resp: 16  13 16   SpO2: 97%  97% 99%    The patient in recovery room, steadily awakening and becoming more alert. Following commands with all 4 extremities. Vital signs stable in PACU.  Plan: Continued progress to postoperative recovery, to be transferred to ICU in bed available.  Hewitt Shorts, MD 08/10/2012, 1:00 PM

## 2012-08-10 NOTE — Progress Notes (Signed)
UR COMPLETED  

## 2012-08-10 NOTE — Anesthesia Preprocedure Evaluation (Addendum)
Anesthesia Evaluation  Patient identified by MRN, date of birth, ID band  Reviewed: Allergy & Precautions, H&P , NPO status , Patient's Chart, lab work & pertinent test results  History of Anesthesia Complications (+) PONV  Airway Mallampati: II TM Distance: >3 FB Neck ROM: Full    Dental No notable dental hx. (+) Teeth Intact and Dental Advisory Given   Pulmonary neg pulmonary ROS,  breath sounds clear to auscultation  Pulmonary exam normal       Cardiovascular hypertension, On Medications Rhythm:Regular Rate:Normal     Neuro/Psych negative neurological ROS  negative psych ROS   GI/Hepatic Neg liver ROS, GERD-  Medicated and Controlled,  Endo/Other  Type 2, Oral Hypoglycemic Agents  Renal/GU negative Renal ROS  negative genitourinary   Musculoskeletal   Abdominal (+) + obese,   Peds  Hematology negative hematology ROS (+) REFUSES BLOOD PRODUCTS, JEHOVAH'S WITNESSRefuses albumin Accepts cell saver   Anesthesia Other Findings   Reproductive/Obstetrics negative OB ROS                          Anesthesia Physical Anesthesia Plan  ASA: III  Anesthesia Plan: General   Post-op Pain Management:    Induction: Intravenous  Airway Management Planned: Oral ETT  Additional Equipment: Arterial line  Intra-op Plan:   Post-operative Plan: Extubation in OR  Informed Consent: I have reviewed the patients History and Physical, chart, labs and discussed the procedure including the risks, benefits and alternatives for the proposed anesthesia with the patient or authorized representative who has indicated his/her understanding and acceptance.   Dental advisory given  Plan Discussed with: CRNA and Anesthesiologist  Anesthesia Plan Comments:        Anesthesia Quick Evaluation

## 2012-08-10 NOTE — Op Note (Signed)
08/10/2012  12:07 PM  PATIENT:  Tonya Miranda  71 y.o. female  PRE-OPERATIVE DIAGNOSIS:  Right trigeminal neuralgia  POST-OPERATIVE DIAGNOSIS:  Right trigeminal neuralgia  PROCEDURE:  Procedure(s): CRANIECTOMY FOR TIC DOULOUREUX: Right retromastoid craniectomy and microvascular decompression of the right trigeminal nerve  SURGEON:  Surgeon(s): Hewitt Shorts, MD Karn Cassis, MD  ASSISTANTS: Hilda Lias, M.D.  ANESTHESIA:   general  EBL:  Total I/O In: 1100 [I.V.:1100] Out: 500 [Urine:450; Blood:50]  BLOOD ADMINISTERED:none  COUNT: Correct per nursing staff  DICTATION: Patient was brought to the operating room, placed under general endotracheal anesthesia. The 3 pin Mayfield head holder was applied, and the patient was turned to a three-quarter park bench position with all body prominences padded as needed. The right retromastoid area was shaved, and then prepped with Betadine soap and solution and draped in a sterile fashion. A vertical 8 cm in length right retromastoid incision was made, the length of the incision was infiltrated with local site with epinephrine prior to incision. Dissection was carried down to subcutaneous tissue fascia and muscle to the occipital bone, and then the nuchal musculature was elevated from the bone and self-retaining retractors were placed. A craniectomy was performed using the high-speed drill and Kerrison punches. It was extended rostrally and laterally so as to expose the right transverse and right sigmoid sinuses. The dura was then opened in a curvilinear fashion and then a second incision was made split the dura so that a flap hinge towards the transverse sinus and another flap hinged towards the sigmoid sinus. The dura was tacked up to the adjacent soft tissues. The operating microscope was then draped and brought in the field for additional magnification, illumination, and visualization. We open the arachnoid cisterns and aspirated  CSF which allowed for the cerebellum to fall away from the superior and lateral wall of the posterior fossa. We then further open the arachnoid. The buddy halo self-retaining retractor system was set up in a single retractor was placed within the underlying Cottonoid patty against the cerebellum. Gentle retraction was performed and were able to visualize the trigeminal nerve. We continued to open the arachnoid around the nerve. We did visualize the fourth nerve rostrally and the seventh nerve caudally. As we exposed the trigeminal nerve we found that it was in fact compressed both by a vein and an artery. We first freed up the vein from the its surrounding arachnoid, he was then coagulated, and sharply divided. This allowed [place] to then better visualized the arterial compression. We began to open up the arachnoid around the artery, and then were able to mobilize around the nerve, and the artery fell away in a forward direction. We then placed 2 small pledgets of Teflon felt between the nerve and the artery. It is felt that good decompression of the right trigeminal nerve had been achieved. We then proceeded with closure. Interrupted 4-0 Nurolon sutures the edge of the craniectomy had Gelfoam with thrombin packed along the edge to establish hemostasis. And then the nuchal muscles were approximate interrupted undyed 0 Vicryl sutures, the deep fascia closed with interrupted undyed 0 Vicryl sutures., And then the subcutaneous and subcuticular layer were closed with interrupted inverted 2-0 Vicryl sutures. The skin is approximate surgical staples. The was dressed with that take, sterile gauze, and Hypafix. Following surgery the patient was turned back to a supine position, the 3 pin Mayfield head holder was removed, and the patient is to be reversed and the anesthetic, extubated, and  transferred to the recovery room for further care.  PLAN OF CARE: Admit to inpatient   PATIENT DISPOSITION:  PACU - hemodynamically  stable.   Delay start of Pharmacological VTE agent (>24hrs) due to surgical blood loss or risk of bleeding:  yes

## 2012-08-11 ENCOUNTER — Encounter (HOSPITAL_COMMUNITY): Payer: Self-pay | Admitting: Neurosurgery

## 2012-08-11 LAB — CBC
HCT: 32.5 % — ABNORMAL LOW (ref 36.0–46.0)
Hemoglobin: 10.2 g/dL — ABNORMAL LOW (ref 12.0–15.0)
MCH: 26.2 pg (ref 26.0–34.0)
MCHC: 31.4 g/dL (ref 30.0–36.0)
MCV: 83.5 fL (ref 78.0–100.0)
Platelets: 256 10*3/uL (ref 150–400)
RBC: 3.89 MIL/uL (ref 3.87–5.11)
RDW: 17 % — ABNORMAL HIGH (ref 11.5–15.5)
WBC: 8.7 10*3/uL (ref 4.0–10.5)

## 2012-08-11 LAB — DIFFERENTIAL
Basophils Absolute: 0 10*3/uL (ref 0.0–0.1)
Basophils Relative: 0 % (ref 0–1)
Eosinophils Absolute: 0 10*3/uL (ref 0.0–0.7)
Eosinophils Relative: 0 % (ref 0–5)
Lymphocytes Relative: 6 % — ABNORMAL LOW (ref 12–46)
Lymphs Abs: 0.5 10*3/uL — ABNORMAL LOW (ref 0.7–4.0)
Monocytes Absolute: 0.2 10*3/uL (ref 0.1–1.0)
Monocytes Relative: 2 % — ABNORMAL LOW (ref 3–12)
Neutro Abs: 8 10*3/uL — ABNORMAL HIGH (ref 1.7–7.7)
Neutrophils Relative %: 93 % — ABNORMAL HIGH (ref 43–77)

## 2012-08-11 LAB — BASIC METABOLIC PANEL
BUN: 12 mg/dL (ref 6–23)
CO2: 24 mEq/L (ref 19–32)
Calcium: 8.4 mg/dL (ref 8.4–10.5)
Chloride: 106 mEq/L (ref 96–112)
Creatinine, Ser: 0.78 mg/dL (ref 0.50–1.10)
GFR calc Af Amer: >90 mL/min (ref >90)
GFR calc non Af Amer: 82 mL/min — ABNORMAL LOW (ref >90)
Glucose, Bld: 159 mg/dL — ABNORMAL HIGH (ref 70–99)
Potassium: 4.3 mEq/L (ref 3.5–5.1)
Sodium: 139 mEq/L (ref 135–145)

## 2012-08-11 LAB — GLUCOSE, CAPILLARY: Glucose-Capillary: 145 mg/dL — ABNORMAL HIGH (ref 70–99)

## 2012-08-11 MED ORDER — FLUTICASONE PROPIONATE 50 MCG/ACT NA SUSP
2.0000 | Freq: Every day | NASAL | Status: DC | PRN
  Administered 2012-08-11: 2 via NASAL
  Filled 2012-08-11: qty 16

## 2012-08-11 MED ORDER — ACETAMINOPHEN 325 MG PO TABS
650.0000 mg | ORAL_TABLET | ORAL | Status: DC | PRN
  Administered 2012-08-11 – 2012-08-13 (×8): 650 mg via ORAL
  Filled 2012-08-11 (×8): qty 2

## 2012-08-11 MED ORDER — DEXAMETHASONE SODIUM PHOSPHATE 4 MG/ML IJ SOLN
4.0000 mg | Freq: Four times a day (QID) | INTRAMUSCULAR | Status: DC
  Administered 2012-08-11 – 2012-08-12 (×4): 4 mg via INTRAVENOUS
  Filled 2012-08-11 (×5): qty 1

## 2012-08-11 MED ORDER — HYDROCODONE-ACETAMINOPHEN 5-325 MG PO TABS: 1.0000 | ORAL_TABLET | ORAL | Status: DC | PRN

## 2012-08-11 NOTE — Progress Notes (Signed)
Subjective: Patient's without complaints. No facial pain. Mild right facial numbness. Some mild nausea overnight, responded well to medication, no nausea this time.  Objective: Vital signs in last 24 hours: Filed Vitals:   08/11/12 0400 08/11/12 0500 08/11/12 0600 08/11/12 0700  BP: 117/77 132/84 120/80 127/79  Pulse: 97 98 100 99  Temp: 98.8 F (37.1 C)     TempSrc: Oral     Resp: 11 10 12 10   Height:      Weight:      SpO2: 92% 92% 92% 93%    Intake/Output from previous day: 09/04 0701 - 09/05 0700 In: 3800 [I.V.:2800] Out: 1275 [Urine:1225; Blood:50] Intake/Output this shift:    Physical Exam:  Patient awake alert, oriented. Following commands. Speech fluent. Pupils equal and react to, and about 4 mm bilaterally. Extra ocular movements intact. Facial movements symmetrical. Tongue midline. Moving all 4 extremities well. Dressing clean dry. Accu-Chek 145 this morning.   CBC  Basename 08/11/12 0430  WBC 8.7  HGB 10.2*  HCT 32.5*  PLT 256   BMET  Basename 08/11/12 0430  NA 139  K 4.3  CL 106  CO2 24  GLUCOSE 159*  BUN 12  CREATININE 0.78  CALCIUM 8.4    Assessment/Plan: Doing well following surgery. Good relief of facial pain. We'll begin to discontinue lines and mobilize. We'll DC Foley. We'll begin clear liquids and advanced to carb modified diet. We'll decrease of IVF to 30 cc per hour once taking well by mouth. We'll begin out of bed to chair, and advanced ambulation in ICU. We'll decrease Decadron to 4 mg every 6 hours.    Hewitt Shorts, MD 08/11/2012, 7:54 AM

## 2012-08-11 NOTE — Progress Notes (Signed)
Patient ID: Tonya Miranda, female   DOB: 1941/08/10, 71 y.o.   MRN: [id number] Some numbness left face. No weakness, face symmetrical. Vs wnl.

## 2012-08-12 MED ORDER — DEXAMETHASONE 4 MG PO TABS
4.0000 mg | ORAL_TABLET | Freq: Two times a day (BID) | ORAL | Status: DC
  Administered 2012-08-12 – 2012-08-13 (×2): 4 mg via ORAL
  Filled 2012-08-12 (×5): qty 1

## 2012-08-12 MED FILL — Sodium Chloride Irrigation Soln 0.9%: Qty: 3000 | Status: AC

## 2012-08-12 MED FILL — Sodium Chloride IV Soln 0.9%: INTRAVENOUS | Qty: 1000 | Status: AC

## 2012-08-12 MED FILL — Heparin Sodium (Porcine) Inj 1000 Unit/ML: INTRAMUSCULAR | Qty: 30 | Status: AC

## 2012-08-12 NOTE — Progress Notes (Signed)
Pt ambulated three whole laps on the unit and also walked outside the unit in the lobby.  Patient was steady, denied N/T, denied pain, tolerated well.  Well ambulate again later

## 2012-08-12 NOTE — Progress Notes (Signed)
Subjective: Patient resting in bed comfortably, without complaints. Denies facial pain. Right facial numbness diminishing. Taking well by mouth. Has been out of bed to chair, ambulated in room, but not yet in the halls.  Objective: Vital signs in last 24 hours: Filed Vitals:   08/12/12 0400 08/12/12 0500 08/12/12 0600 08/12/12 0700  BP: 147/70 129/63 130/69 135/70  Pulse: 81 80 81 78  Temp: 98 F (36.7 C)     TempSrc: Oral     Resp: 12 12 12 12   Height:      Weight:      SpO2: 97% 97% 96% 98%    Intake/Output from previous day: 09/05 0701 - 09/06 0700 In: 1900.5 [P.O.:1075; I.V.:825.5] Out: 800 [Urine:800] Intake/Output this shift:    Physical Exam:  Awake and alert, oriented. Following commands. Speech fluent. Pupils equal round and reactive to light, and about 4 mm bilaterally. Extra ocular movements intact. Facial movements symmetrical. Tongue midline. Moving all 4 extremities well. No drift of upper extremities. Dressing removed, wound clean and dry, left opened air.   Assessment/Plan: Doing well following surgery. Encouraged to ambulate in the halls. We'll DC central line, IV fluids, and IV meds. We'll transfer to 4 N. We'll continue to taper Decadron, and change to by mouth.   Hewitt Shorts, MD 08/12/2012, 7:39 AM

## 2012-08-13 NOTE — Discharge Summary (Signed)
Physician Discharge Summary  Patient ID: Tonya Miranda MRN: [id number] DOB/AGE: Nov 28, 1941 71 y.o.  Admit date: 08/10/2012 Discharge date: 08/13/2012  Admission Diagnoses:  Trigeminal neuralgia  Discharge Diagnoses:   Trigeminal neuralgia  Discharged Condition: good  Hospital Course: Patient was admitted, underwent a right retromastoid craniectomy for microvascular decompression of the right trigeminal nerve. Postoperatively she has done well. She has had complete relief of her trigeminal neuralgia. She has transient right facial numbness which has resolved. She has been up and ambulating. Her wound is healing well, it is clean and dry. She is being discharged home with instructions regarding wound care and activities. She is being given a prescription for tapering course of Decadron (we prescribed 4 mg tablets, she is to take half a tablet twice a day for 2 days, then half tablet daily for 2 days then, half a tablet qoday for 2 doses, then discontinue, a total of 4 tablets were prescribed). Also a prescription for Norco 5/325, half a tablet to one tablet every 4 hours when necessary pain, 30 tablets, no refills, was given. She is to return on September 13 for stable removal to the office.   Discharge Exam: Blood pressure 162/91, pulse 89, temperature 98.1 F (36.7 C), temperature source Oral, resp. rate 18, height 5\' 1"  (1.549 m), weight 94.5 kg (208 lb 5.4 oz), SpO2 98.00%.  Disposition: Home   Medication List  As of 08/13/2012  8:12 AM   TAKE these medications         acetaminophen 500 MG tablet   Commonly known as: TYLENOL   Take 500 mg by mouth every 6 (six) hours as needed. For pain.      amitriptyline 25 MG tablet   Commonly known as: ELAVIL   Take 50 mg by mouth at bedtime.      BILBERRY PO   Take 1 tablet by mouth every evening.      cetirizine 10 MG tablet   Commonly known as: ZYRTEC   Take 10 mg by mouth daily.      diphenhydrAMINE 25 MG tablet   Commonly known as:  BENADRYL   Take 25 mg by mouth as needed. For allergies.      FISH OIL PO   Take 2 tablets by mouth every evening.      fluticasone 50 MCG/ACT nasal spray   Commonly known as: FLONASE   Place 2 sprays into the nose daily.      ibuprofen 200 MG tablet   Commonly known as: ADVIL,MOTRIN   Take 200 mg by mouth every 6 (six) hours as needed. For pain.      lisinopril 10 MG tablet   Commonly known as: PRINIVIL,ZESTRIL   Take 10 mg by mouth daily.      metFORMIN 500 MG 24 hr tablet   Commonly known as: GLUCOPHAGE-XR   Take 500 mg by mouth daily with breakfast.      metoCLOPramide 10 MG tablet   Commonly known as: REGLAN   Take 10 mg by mouth as needed. For reflux.      multivitamin with minerals Tabs   Take 1 tablet by mouth daily.      pantoprazole 40 MG tablet   Commonly known as: PROTONIX   Take 40 mg by mouth daily.      VITAMIN D-3 PO   Take 1 tablet by mouth daily.             Signed: Hewitt Shorts, MD 08/13/2012, 8:12 AM

## 2012-08-15 NOTE — Care Management Note (Signed)
    Page 1 of 1   08/15/2012     8:13:41 AM   CARE MANAGEMENT NOTE 08/15/2012  Patient:  Tonya Miranda, Tonya Miranda   Account Number:  [id number]  Date Initiated:  08/11/2012  Documentation initiated by:  [facility]  Subjective/Objective Assessment:   Admitted postop craniotomy for tic douloureux.     Action/Plan:   Anticipated DC Date:  08/14/2012   Anticipated DC Plan:  HOME/SELF CARE      DC Planning Services  CM consult      Choice offered to / List presented to:             Status of service:  In process, will continue to follow Medicare Important Message given?   (If response is "NO", the following Medicare IM given date fields will be blank) Date Medicare IM given:   Date Additional Medicare IM given:    Discharge Disposition:  HOME/SELF CARE  Per UR Regulation:  Reviewed for med. necessity/level of care/duration of stay  If discussed at Long Length of Stay Meetings, dates discussed:    Comments:

## 2012-09-13 ENCOUNTER — Other Ambulatory Visit: Payer: Self-pay | Admitting: Neurosurgery

## 2012-09-13 ENCOUNTER — Ambulatory Visit
Admission: RE | Admit: 2012-09-13 | Discharge: 2012-09-13 | Disposition: A | Discharge status: Home / Self Care | Payer: Medicare Other | Source: Ambulatory Visit | Attending: Neurosurgery | Admitting: Neurosurgery

## 2012-09-13 DIAGNOSIS — G500 Trigeminal neuralgia: Secondary | ICD-10-CM

## 2012-12-09 ENCOUNTER — Other Ambulatory Visit (HOSPITAL_COMMUNITY): Payer: Self-pay | Admitting: Interventional Radiology

## 2012-12-09 DIAGNOSIS — I729 Aneurysm of unspecified site: Secondary | ICD-10-CM

## 2012-12-14 ENCOUNTER — Ambulatory Visit (HOSPITAL_COMMUNITY)
Admission: RE | Admit: 2012-12-14 | Discharge: 2012-12-14 | Disposition: A | Discharge status: Home / Self Care | Payer: Medicare Other | Source: Ambulatory Visit | Attending: Interventional Radiology | Admitting: Interventional Radiology

## 2012-12-14 ENCOUNTER — Ambulatory Visit (HOSPITAL_COMMUNITY): Admission: RE | Admit: 2012-12-14 | Payer: Medicare Other | Source: Ambulatory Visit

## 2012-12-14 DIAGNOSIS — I671 Cerebral aneurysm, nonruptured: Secondary | ICD-10-CM | POA: Insufficient documentation

## 2012-12-14 DIAGNOSIS — I729 Aneurysm of unspecified site: Secondary | ICD-10-CM

## 2012-12-14 DIAGNOSIS — I10 Essential (primary) hypertension: Secondary | ICD-10-CM | POA: Insufficient documentation

## 2012-12-14 DIAGNOSIS — E119 Type 2 diabetes mellitus without complications: Secondary | ICD-10-CM | POA: Insufficient documentation

## 2012-12-14 DIAGNOSIS — G319 Degenerative disease of nervous system, unspecified: Secondary | ICD-10-CM | POA: Insufficient documentation

## 2012-12-14 LAB — CREATININE, SERUM
Creatinine, Ser: 0.92 mg/dL (ref 0.50–1.10)
GFR calc Af Amer: 70 mL/min — ABNORMAL LOW (ref >90)
GFR calc non Af Amer: 61 mL/min — ABNORMAL LOW (ref >90)

## 2012-12-14 MED ORDER — GADOBENATE DIMEGLUMINE 529 MG/ML IV SOLN
18.0000 mL | Freq: Once | INTRAVENOUS | Status: AC
  Administered 2012-12-14: 18 mL via INTRAVENOUS

## 2013-10-03 ENCOUNTER — Ambulatory Visit (INDEPENDENT_AMBULATORY_CARE_PROVIDER_SITE_OTHER): Payer: Medicare Other | Admitting: Internal Medicine

## 2013-10-03 ENCOUNTER — Encounter: Payer: Self-pay | Admitting: Internal Medicine

## 2013-10-03 VITALS — BP 120/90 | HR 96 | Ht 61.0 in | Wt 194.8 lb

## 2013-10-03 DIAGNOSIS — H539 Unspecified visual disturbance: Secondary | ICD-10-CM

## 2013-10-03 DIAGNOSIS — R1115 Cyclical vomiting syndrome unrelated to migraine: Secondary | ICD-10-CM

## 2013-10-03 MED ORDER — PROCHLORPERAZINE 25 MG RE SUPP: 25.0000 mg | Freq: Two times a day (BID) | RECTAL | Status: DC | PRN

## 2013-10-03 NOTE — Progress Notes (Signed)
Subjective:    Patient ID: Tonya Miranda, female    DOB: 04-10-1941, 72 y.o.   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 She has had years, going back to [place] Fearing is not early 20s of intermittent severe spells of abdominal pain and nausea and vomiting that lasted day or 2. They often require emergency room visit because of the protracted nature of the vomiting and need for intravenous antibiotics and also pain medication. She had a recent episode that concerned her daughter-in-law also an appointment was made for evaluation. She was seen in [place] forward, at [facility], with a spell of bad abdominal pain with nausea and vomiting. CT scanning demonstrated some fluid in the small bowel, but no evidence of obstruction. Will ascites and a right upper quadrant. Her tach was similar to what she's had over the years. She reports a sudden onset of nausea and vomiting followed by pain. Again the last for a day or 2. She had an EGD in January 2013 by Dr. Rogelio Seen, it was normal. She had a colonoscopy on 2009 with small hemorrhoids and diverticulosis otherwise normal into the ileum. Previous attempt at ERCP because of an elevated alkaline phosphatase and this nausea and vomiting in 2001 demonstrated normal pancreatic duct but could not cannulate the bile duct. She has been treated with Elavil in the past. That was for constipation and right upper quadrant pain 2 hours after meals. Colonoscopy then, in 2000 and demonstrated melanosis coli, confirmed by random biopsies, she also had had a CT scan and small bowel follow-through at that point that was negative.  Lab studies on 09/01/2013 in the emergency department notable for potassium 3.4, she calcium level of 10.7, and albumin level 4.7 and a normal CBC. Allergies  Allergen Reactions  . Morphine And Related     Hallucinations, headache   Outpatient Prescriptions Prior to Visit   Medication Sig Dispense Refill  . acetaminophen (TYLENOL) 500 MG tablet Take 500 mg by mouth every 6 (six) hours as needed. For pain.      . cetirizine (ZYRTEC) 10 MG tablet Take 10 mg by mouth daily.      . diphenhydrAMINE (BENADRYL) 25 MG tablet Take 25 mg by mouth as needed. For allergies.      . fluticasone (FLONASE) 50 MCG/ACT nasal spray Place 2 sprays into the nose daily.      Marland Kitchen ibuprofen (ADVIL,MOTRIN) 200 MG tablet Take 200 mg by mouth every 6 (six) hours as needed. For pain.      . Multiple Vitamin (MULTIVITAMIN WITH MINERALS) TABS Take 1 tablet by mouth daily.      . pantoprazole (PROTONIX) 40 MG tablet Take 40 mg by mouth daily.      Marland Kitchen amitriptyline (ELAVIL) 25 MG tablet Take 50 mg by mouth at bedtime.      . Bilberry, Vaccinium myrtillus, (BILBERRY PO) Take 1 tablet by mouth every evening.      . Cholecalciferol (VITAMIN D-3 PO) Take 1 tablet by mouth daily.      Marland Kitchen lisinopril (PRINIVIL,ZESTRIL) 10 MG tablet Take 10 mg by mouth daily.      . metFORMIN (GLUCOPHAGE-XR) 500 MG 24 hr tablet Take 500 mg by mouth daily with breakfast.      . metoCLOPramide (REGLAN) 10 MG tablet Take 10 mg by mouth as needed. For reflux.      . Omega-3 Fatty Acids (FISH OIL PO) Take 2  tablets by mouth every evening.       No facility-administered medications prior to visit.   Past Medical History  Diagnosis Date  . PONV (postoperative nausea and vomiting)   . Hypertension   . Diabetes mellitus   . GERD (gastroesophageal reflux disease)   . Ovarian cancer   . Osteoarthritis   . Diverticulosis of sigmoid colon 04/02/2008    Dr. Milana Kidney  . Fibromyalgia   . Chronic headaches    Past Surgical History  Procedure Laterality Date  . Brain surgery      aneurysm  . Hernia repair      left inguinal repair and adhesion repair  . Abdominal hysterectomy      ovarian cancer  . Cholecystectomy    . Craniectomy  08/10/2012    Procedure: CRANIECTOMY FOR TIC DOULOUREUX;  Surgeon: Hewitt Shorts,  MD;  Location: [facility];  Service: Neurosurgery;  Laterality: Right;  Right Retromastoid Craniectomy with Microvascular Decompression, Decompression of the trigeminal nerve  . Esophagogastroduodenoscopy  01/07/2012    Dr. Milana Kidney, [place].  . Colonoscopy  04/02/2008    Dr. Milana Kidney, [place].  . Colonoscopy w/ biopsies  09/09/1999    Dr. Beaulah Dinning Imam   History   Social History  . Marital Status: Married    Spouse Name: N/A    Number of Children: 3  . Years of Education: N/A   Occupational History  . Bookkeeper    Social History Main Topics  . Smoking status: Never Smoker   . Smokeless tobacco: Never Used  . Alcohol Use: Yes     Comment: wine rarely  . Drug Use: No  . Sexual Activity: None   Other Topics Concern  . None   Social History Narrative  . None   Family History  Problem Relation Age of Onset  . Mesothelioma Sister     Review of Systems Review of systems is positive for those things mentioned in the history of present illness    Objective:   Physical Exam General:  Well-developed, well-nourished and in no acute distress Eyes:  anicteric. ENT:   Mouth and posterior pharynx free of lesions.  Neck:   supple w/o thyromegaly or mass.  Lungs: Clear to auscultation bilaterally. Heart:  S1S2, no rubs, murmurs, gallops. Abdomen:  soft, non-tender, no hepatosplenomegaly, hernia, or mass and BS+. No splash there are well-healed surgical scars Lymph:  no cervical or supraclavicular adenopathy. Extremities:   no edema Skin   no rash. Neuro:  A&O x 3.  Psych:  appropriate mood and  Affect.   Data Reviewed: Per history of present illness       Assessment & Plan:   1. Cyclic vomiting syndrome   2. Visual aura   3. Serum calcium elevated    CC: Nicolasa Ducking, MD

## 2013-10-03 NOTE — Patient Instructions (Addendum)
We are going to obtain your CTscan done at [facility] in [place] and Dr. Leone Payor is going to review the results and your records .  Also he will be in touch with your eye Dr.  Your follow up visit will be determined and we will be in touch.  We have sent the following medications to your pharmacy for you to pick up at your convenience: Compazine suppositories to take if you have nausea and vomiting.  We are giving you information on cyclic vomiting syndrome to read.  It is the time of year to have a vaccination to prevent the flu (influenza virus).  Please have this done through your primary care provider or you can get this done at local pharmacies or the [facility]. It would be very helpful if you notify your primary care provider when and where you had the vaccination given by messaging them in My Chart, leaving a message or faxing the information.   I appreciate the opportunity to care for you.

## 2013-10-09 DIAGNOSIS — R1115 Cyclical vomiting syndrome unrelated to migraine: Secondary | ICD-10-CM | POA: Insufficient documentation

## 2013-10-09 DIAGNOSIS — H539 Unspecified visual disturbance: Secondary | ICD-10-CM | POA: Insufficient documentation

## 2013-10-09 NOTE — Assessment & Plan Note (Signed)
My sense is this is probably spurious or minor and not related to her symptoms. We will call her and make sure she is not on supplements, and also recheck the level or have her do that.

## 2013-10-09 NOTE — Assessment & Plan Note (Signed)
Her history supports this diagnosis. 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 Chronicity goes against serious health threat. Given the low # of attacks annually will treat with abortive prochloperazine suppositories rather than a daily tx. Will also discuss visual aura with her eye physician.

## 2013-10-10 ENCOUNTER — Telehealth: Payer: Self-pay

## 2013-10-10 NOTE — Telephone Encounter (Signed)
Message copied by Annett Fabian on Tue Oct 10, 2013  9:23 AM ------      Message from: Stan Head E      Created: Mon Oct 09, 2013  5:18 PM      Regarding: please call       1) Tell her I see that her calcium level is elevated on labs in Sept - that did not register with me when she was here. It can cause nausae and vomiting though I doubt it is source of her years of problems.            Is she on supplements?      If so - stop      If not she should have level rechecked            2) Please get the name of her eye MD - I wrote it down but want to double check - I was going to call him about her visual sxs            FYI This is Gareth Eagle mother-in-law ------

## 2013-10-10 NOTE — Telephone Encounter (Signed)
She is aware She is not on any calcium supplements, and has not been for some time.  She will come for labs later in the week Opthamologist is Lemar Lofty- [facility] [phone]

## 2013-10-11 ENCOUNTER — Other Ambulatory Visit (INDEPENDENT_AMBULATORY_CARE_PROVIDER_SITE_OTHER): Payer: Medicare Other

## 2013-10-13 NOTE — Progress Notes (Signed)
Quick Note:  Repeat calcium is fine No worries ______

## 2014-01-09 ENCOUNTER — Other Ambulatory Visit (HOSPITAL_COMMUNITY): Payer: Self-pay | Admitting: Interventional Radiology

## 2014-01-09 DIAGNOSIS — I679 Cerebrovascular disease, unspecified: Secondary | ICD-10-CM

## 2014-01-09 DIAGNOSIS — I729 Aneurysm of unspecified site: Secondary | ICD-10-CM

## 2014-01-09 DIAGNOSIS — H539 Unspecified visual disturbance: Secondary | ICD-10-CM

## 2014-01-09 DIAGNOSIS — I671 Cerebral aneurysm, nonruptured: Secondary | ICD-10-CM

## 2014-01-18 ENCOUNTER — Other Ambulatory Visit (HOSPITAL_COMMUNITY): Payer: Self-pay | Admitting: Interventional Radiology

## 2014-01-18 DIAGNOSIS — I671 Cerebral aneurysm, nonruptured: Secondary | ICD-10-CM

## 2014-01-18 DIAGNOSIS — I729 Aneurysm of unspecified site: Secondary | ICD-10-CM

## 2014-01-19 ENCOUNTER — Ambulatory Visit (HOSPITAL_COMMUNITY)
Admission: RE | Admit: 2014-01-19 | Discharge: 2014-01-19 | Disposition: A | Discharge status: Home / Self Care | Payer: Medicare Other | Source: Ambulatory Visit | Attending: Interventional Radiology | Admitting: Interventional Radiology

## 2014-01-19 DIAGNOSIS — I671 Cerebral aneurysm, nonruptured: Secondary | ICD-10-CM | POA: Insufficient documentation

## 2014-01-19 DIAGNOSIS — G319 Degenerative disease of nervous system, unspecified: Secondary | ICD-10-CM | POA: Insufficient documentation

## 2014-01-19 DIAGNOSIS — Z9889 Other specified postprocedural states: Secondary | ICD-10-CM | POA: Insufficient documentation

## 2014-01-19 DIAGNOSIS — I6789 Other cerebrovascular disease: Secondary | ICD-10-CM | POA: Insufficient documentation

## 2014-01-19 DIAGNOSIS — I729 Aneurysm of unspecified site: Secondary | ICD-10-CM

## 2014-01-19 LAB — CREATININE, SERUM
CREATININE: 0.97 mg/dL (ref 0.50–1.10)
GFR, EST AFRICAN AMERICAN: 66 mL/min — AB (ref >90)
GFR, EST NON AFRICAN AMERICAN: 57 mL/min — AB (ref >90)

## 2014-01-26 ENCOUNTER — Telehealth (HOSPITAL_COMMUNITY): Payer: Self-pay | Admitting: Interventional Radiology

## 2014-01-26 NOTE — Telephone Encounter (Signed)
Pt called and wanted to know how her MRI looked. Told her that per Dr. Keturah Barre she should f/u in 1 yr with another MRI/MRA and that the study was stable. Pt states understanding and is in agreement with this plan of care JM

## 2015-02-07 ENCOUNTER — Other Ambulatory Visit (HOSPITAL_COMMUNITY): Payer: Self-pay | Admitting: Interventional Radiology

## 2015-02-07 DIAGNOSIS — I671 Cerebral aneurysm, nonruptured: Secondary | ICD-10-CM

## 2015-02-27 ENCOUNTER — Ambulatory Visit (HOSPITAL_COMMUNITY)
Admission: RE | Admit: 2015-02-27 | Discharge: 2015-02-27 | Disposition: A | Discharge status: Home / Self Care | Payer: Medicare Other | Source: Ambulatory Visit | Attending: Interventional Radiology | Admitting: Interventional Radiology

## 2015-02-27 ENCOUNTER — Ambulatory Visit (HOSPITAL_COMMUNITY): Admission: RE | Admit: 2015-02-27 | Payer: Medicare Other | Source: Ambulatory Visit

## 2015-02-27 DIAGNOSIS — Z48812 Encounter for surgical aftercare following surgery on the circulatory system: Secondary | ICD-10-CM | POA: Insufficient documentation

## 2015-02-27 DIAGNOSIS — I671 Cerebral aneurysm, nonruptured: Secondary | ICD-10-CM | POA: Insufficient documentation

## 2015-02-27 LAB — CREATININE, SERUM
CREATININE: 1.14 mg/dL — AB (ref 0.50–1.10)
GFR calc Af Amer: 54 mL/min — ABNORMAL LOW (ref >90)
GFR calc non Af Amer: 46 mL/min — ABNORMAL LOW (ref >90)

## 2015-02-27 MED ORDER — GADOBENATE DIMEGLUMINE 529 MG/ML IV SOLN
20.0000 mL | Freq: Once | INTRAVENOUS | Status: AC | PRN
  Administered 2015-02-27: 20 mL via INTRAVENOUS

## 2016-01-23 ENCOUNTER — Other Ambulatory Visit (HOSPITAL_COMMUNITY): Payer: Self-pay | Admitting: Interventional Radiology

## 2016-01-23 DIAGNOSIS — I729 Aneurysm of unspecified site: Secondary | ICD-10-CM

## 2016-03-04 ENCOUNTER — Other Ambulatory Visit: Payer: Self-pay | Admitting: Radiology

## 2016-03-06 ENCOUNTER — Other Ambulatory Visit (HOSPITAL_COMMUNITY): Payer: Self-pay | Admitting: Interventional Radiology

## 2016-03-06 ENCOUNTER — Ambulatory Visit (HOSPITAL_COMMUNITY)
Admission: RE | Admit: 2016-03-06 | Discharge: 2016-03-06 | Disposition: A | Discharge status: Home / Self Care | Payer: Medicare Other | Source: Ambulatory Visit | Attending: Interventional Radiology | Admitting: Interventional Radiology

## 2016-03-06 ENCOUNTER — Encounter (HOSPITAL_COMMUNITY): Payer: Self-pay

## 2016-03-06 DIAGNOSIS — M797 Fibromyalgia: Secondary | ICD-10-CM | POA: Insufficient documentation

## 2016-03-06 DIAGNOSIS — I729 Aneurysm of unspecified site: Secondary | ICD-10-CM

## 2016-03-06 DIAGNOSIS — I671 Cerebral aneurysm, nonruptured: Secondary | ICD-10-CM | POA: Diagnosis not present

## 2016-03-06 DIAGNOSIS — I10 Essential (primary) hypertension: Secondary | ICD-10-CM | POA: Diagnosis not present

## 2016-03-06 DIAGNOSIS — E119 Type 2 diabetes mellitus without complications: Secondary | ICD-10-CM | POA: Insufficient documentation

## 2016-03-06 DIAGNOSIS — Z8543 Personal history of malignant neoplasm of ovary: Secondary | ICD-10-CM | POA: Insufficient documentation

## 2016-03-06 LAB — BASIC METABOLIC PANEL
ANION GAP: 9 (ref 5–15)
BUN: 17 mg/dL (ref 6–20)
CHLORIDE: 110 mmol/L (ref 101–111)
CO2: 21 mmol/L — AB (ref 22–32)
CREATININE: 0.85 mg/dL (ref 0.44–1.00)
Calcium: 9.1 mg/dL (ref 8.9–10.3)
GFR calc Af Amer: >60 mL/min (ref >60)
GFR calc non Af Amer: >60 mL/min (ref >60)
GLUCOSE: 101 mg/dL — AB (ref 65–99)
POTASSIUM: 3.9 mmol/L (ref 3.5–5.1)
SODIUM: 140 mmol/L (ref 135–145)

## 2016-03-06 LAB — APTT: APTT: 27 s (ref 24–37)

## 2016-03-06 LAB — CBC
HCT: 35.6 % — ABNORMAL LOW (ref 36.0–46.0)
Hemoglobin: 11.2 g/dL — ABNORMAL LOW (ref 12.0–15.0)
MCH: 26.3 pg (ref 26.0–34.0)
MCHC: 31.5 g/dL (ref 30.0–36.0)
MCV: 83.6 fL (ref 78.0–100.0)
PLATELETS: 284 10*3/uL (ref 150–400)
RBC: 4.26 MIL/uL (ref 3.87–5.11)
RDW: 16.4 % — ABNORMAL HIGH (ref 11.5–15.5)
WBC: 3.6 10*3/uL — ABNORMAL LOW (ref 4.0–10.5)

## 2016-03-06 LAB — PROTIME-INR
INR: 1.06 (ref 0.00–1.49)
Prothrombin Time: 14 seconds (ref 11.6–15.2)

## 2016-03-06 MED ORDER — IOPAMIDOL (ISOVUE-300) INJECTION 61%: 150.0000 mL | Freq: Once | INTRAVENOUS | Status: DC | PRN

## 2016-03-06 MED ORDER — FENTANYL CITRATE (PF) 100 MCG/2ML IJ SOLN
INTRAMUSCULAR | Status: AC | PRN
  Administered 2016-03-06: 25 ug via INTRAVENOUS

## 2016-03-06 MED ORDER — MIDAZOLAM HCL 2 MG/2ML IJ SOLN
INTRAMUSCULAR | Status: AC | PRN
  Administered 2016-03-06: 1 mg via INTRAVENOUS

## 2016-03-06 MED ORDER — SODIUM CHLORIDE 0.9 % IV SOLN: INTRAVENOUS | Status: AC

## 2016-03-06 MED ORDER — SODIUM CHLORIDE 0.9 % IV SOLN
Freq: Once | INTRAVENOUS | Status: AC
  Administered 2016-03-06: 10:00:00 via INTRAVENOUS

## 2016-03-06 MED ORDER — HEPARIN SOD (PORK) LOCK FLUSH 100 UNIT/ML IV SOLN
INTRAVENOUS | Status: AC
  Filled 2016-03-06: qty 5

## 2016-03-06 MED ORDER — MIDAZOLAM HCL 2 MG/2ML IJ SOLN
INTRAMUSCULAR | Status: AC
  Filled 2016-03-06: qty 2

## 2016-03-06 MED ORDER — FENTANYL CITRATE (PF) 100 MCG/2ML IJ SOLN
INTRAMUSCULAR | Status: AC
  Filled 2016-03-06: qty 2

## 2016-03-06 MED ORDER — IOHEXOL 300 MG/ML  SOLN
150.0000 mL | Freq: Once | INTRAMUSCULAR | Status: AC | PRN
  Administered 2016-03-06: 60 mL via INTRAVENOUS

## 2016-03-06 MED ORDER — HEPARIN SOD (PORK) LOCK FLUSH 100 UNIT/ML IV SOLN
INTRAVENOUS | Status: AC | PRN
  Administered 2016-03-06: 1000 [IU] via INTRAVENOUS

## 2016-03-06 MED ORDER — LIDOCAINE HCL 1 % IJ SOLN
INTRAMUSCULAR | Status: AC
  Filled 2016-03-06: qty 20

## 2016-03-06 MED ORDER — HEPARIN SOD (PORK) LOCK FLUSH 100 UNIT/ML IV SOLN
INTRAVENOUS | Status: AC
  Filled 2016-03-06: qty 15

## 2016-03-06 NOTE — Sedation Documentation (Signed)
ETCO2 removed per Dr. Deveshwar 

## 2016-03-06 NOTE — Procedures (Signed)
S/P bilateral common carotid arteriogram. RT CFA approach. Findings . 1Stable coiled LT PCom region aneurysm,with minimal neck remnant. 2.No sig outpouching noted in the ACom region

## 2016-03-06 NOTE — H&P (Signed)
Chief Complaint: Patient was seen in consultation today for cerebral arteriogram at the request of Deveshwar,Sanjeev  Referring Physician(s): Dr Chriss Czar  Supervising Physician: Luanne Bras  History of Present Illness: Tonya Miranda is a 75 y.o. female   Known to [facility] Left posterior communicating artery embolization 02/2101 Follows with Dr Estanislado Pandy Angio and MRI MRI 02/2015: IMPRESSION: 1. Unchanged 2 mm left ACA aneurysm. 2. 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 3. No acute intracranial abnormality.  Pt asymptomatic Denies headache or neuro symptoms Now scheduled for cerebral arteriogram   Past Medical History  Diagnosis Date  . PONV (postoperative nausea and vomiting)   . Hypertension   . Diabetes mellitus   . GERD (gastroesophageal reflux disease)   . Ovarian cancer ([place])   . Osteoarthritis   . Diverticulosis of sigmoid colon 04/02/2008    Dr. Oretha Ellis  . Fibromyalgia   . Chronic headaches     Past Surgical History  Procedure Laterality Date  . Brain surgery      aneurysm  . Hernia repair      left inguinal repair and adhesion repair  . Abdominal hysterectomy      ovarian cancer  . Cholecystectomy    . Craniectomy  08/10/2012    Procedure: CRANIECTOMY FOR TIC DOULOUREUX;  Surgeon: Hosie Spangle, MD;  Location: [place] [facility];  Service: Neurosurgery;  Laterality: Right;  Right Retromastoid Craniectomy with Microvascular Decompression, Decompression of the trigeminal nerve  . Esophagogastroduodenoscopy  01/07/2012    Dr. Oretha Ellis, [place].  . Colonoscopy  04/02/2008    Dr. Oretha Ellis, [place].  . Colonoscopy w/ biopsies  09/09/1999    Dr. Durwin Reges Imam    Allergies: Morphine and related  Medications: Prior to Admission medications   Medication Sig Start Date End Date Taking? Authorizing Provider  acetaminophen (TYLENOL) 500 MG tablet Take 500 mg by mouth every 6 (six)  hours as needed for moderate pain.    Yes Historical Provider, MD  Bilberry, Vaccinium myrtillus, (BILBERRY PO) Take 1 Dose by mouth daily.   Yes Historical Provider, MD  cetirizine (ZYRTEC) 10 MG tablet Take 10 mg by mouth daily.   Yes Historical Provider, MD  diphenhydrAMINE (BENADRYL) 25 MG tablet Take 25 mg by mouth as needed for allergies.    Yes Historical Provider, MD  GELATIN PO Take 1 Dose by mouth daily.   Yes Historical Provider, MD  ibuprofen (ADVIL,MOTRIN) 200 MG tablet Take 400 mg by mouth every 6 (six) hours as needed for mild pain.    Yes Historical Provider, MD  Multiple Vitamin (MULTIVITAMIN WITH MINERALS) TABS Take 1 tablet by mouth daily.   Yes Historical Provider, MD  Omega-3 Fatty Acids (FISH OIL PO) Take 1 Dose by mouth daily.   Yes Historical Provider, MD  pantoprazole (PROTONIX) 40 MG tablet Take 40 mg by mouth daily.   Yes Historical Provider, MD  saccharomyces boulardii (FLORASTOR) 250 MG capsule Take 250 mg by mouth 2 (two) times daily.   Yes Historical Provider, MD  TURMERIC PO Take 1 Dose by mouth daily.   Yes Historical Provider, MD  fluticasone (FLONASE) 50 MCG/ACT nasal spray Place 2 sprays into the nose daily.    Historical Provider, MD     Family History  Problem Relation Age of Onset  . Mesothelioma Sister     Social History   Social History  . Marital Status: Married    Spouse Name: N/A  . Number  of Children: 3  . Years of Education: N/A   Occupational History  . Bookkeeper    Social History Main Topics  . Smoking status: Never Smoker   . Smokeless tobacco: Never Used  . Alcohol Use: Yes     Comment: wine rarely  . Drug Use: No  . Sexual Activity: Not Asked   Other Topics Concern  . None   Social History Narrative     Review of Systems: A 12 point ROS discussed and pertinent positives are indicated in the HPI above.  All other systems are negative.  Review of Systems  Constitutional: Negative for activity change, fatigue and  unexpected weight change.  HENT: Negative for tinnitus and trouble swallowing.   Eyes: Negative for visual disturbance.  Cardiovascular: Negative for chest pain.  Musculoskeletal: Negative for back pain.  Neurological: Negative for weakness.  Psychiatric/Behavioral: Negative for behavioral problems and confusion.    Vital Signs: BP 145/79 mmHg  Pulse 72  Temp(Src) 97.9 F (36.6 C) (Oral)  Resp 16  Ht 5\' 1"  (1.549 m)  Wt 190 lb (86.183 kg)  BMI 35.92 kg/m2  SpO2 100%  Physical Exam  Constitutional: She is oriented to person, place, and time.  Cardiovascular: Normal rate, regular rhythm and normal heart sounds.   Pulmonary/Chest: Effort normal and breath sounds normal. She has no wheezes.  Abdominal: Soft. Bowel sounds are normal. There is no tenderness.  Musculoskeletal: Normal range of motion.  Neurological: She is alert and oriented to person, place, and time.  Skin: Skin is warm and dry.  Psychiatric: She has a normal mood and affect. Her behavior is normal. Judgment and thought content normal.  Nursing note and vitals reviewed.   Mallampati Score:  MD Evaluation Airway: WNL Heart: WNL Abdomen: WNL Chest/ Lungs: WNL ASA  Classification: 3 Mallampati/Airway Score: One  Imaging: No results found.  Labs:  CBC:  Recent Labs  03/06/16 1021  WBC 3.6*  HGB 11.2*  HCT 35.6*  PLT 284    COAGS: No results for input(s): INR, APTT in the last 8760 hours.  BMP: No results for input(s): NA, K, CL, CO2, GLUCOSE, BUN, CALCIUM, CREATININE, GFRNONAA, GFRAA in the last 8760 hours.  Invalid input(s): CMP  LIVER FUNCTION TESTS: No results for input(s): BILITOT, AST, ALT, ALKPHOS, PROT, ALBUMIN in the last 8760 hours.  TUMOR MARKERS: No results for input(s): AFPTM, CEA, CA199, CHROMGRNA in the last 8760 hours.  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 All of the patient's questions were answered, patient is agreeable to proceed. Consent signed and in chart.   Thank you for this interesting consult.  I greatly enjoyed meeting Tonya Miranda and look forward to participating in their care.  A copy of this report was sent to the requesting provider on this date.   Electronically Signed: Monia Sabal A 03/06/2016, 10:54 AM   I spent a total of  30 Minutes   in face to face in clinical consultation, greater than 50% of which was counseling/coordinating care for cerebral arteriogram

## 2016-03-06 NOTE — Discharge Instructions (Signed)
Angiogram, Care After °Refer to this sheet in the next few weeks. These instructions provide you with information about caring for yourself after your procedure. Your health care provider may also give you more specific instructions. Your treatment has been planned according to current medical practices, but problems sometimes occur. Call your health care provider if you have any problems or questions after your procedure. °WHAT TO EXPECT AFTER THE PROCEDURE °After your procedure, it is typical to have the following: °· Bruising at the catheter insertion site that usually fades within 1-2 weeks. °· Blood collecting in the tissue (hematoma) that may be painful to the touch. It should usually decrease in size and tenderness within 1-2 weeks. °HOME CARE INSTRUCTIONS °· Take medicines only as directed by your health care provider. °· You may shower 24-48 hours after the procedure or as directed by your health care provider. Remove the bandage (dressing) and gently wash the site with plain soap and water. Pat the area dry with a clean towel. Do not rub the site, because this may cause bleeding. °· Do not take baths, swim, or use a hot tub until your health care provider approves. °· Check your insertion site every day for redness, swelling, or drainage. °· Do not apply powder or lotion to the site. °· Do not lift over 10 lb (4.5 kg) for 5 days after your procedure or as directed by your health care provider. °· Ask your health care provider when it is okay to: °¨ Return to work or school. °¨ Resume usual physical activities or sports. °¨ Resume sexual activity. °· Do not drive home if you are discharged the same day as the procedure. Have someone else drive you. °· You may drive 24 hours after the procedure unless otherwise instructed by your health care provider. °· Do not operate machinery or power tools for 24 hours after the procedure or as directed by your health care provider. °· If your procedure was done as an  outpatient procedure, which means that you went home the same day as your procedure, a responsible adult should be with you for the first 24 hours after you arrive home. °· Keep all follow-up visits as directed by your health care provider. This is important. °SEEK MEDICAL CARE IF: °· You have a fever. °· You have chills. °· You have increased bleeding from the catheter insertion site. Hold pressure on the site and call 911. °SEEK IMMEDIATE MEDICAL CARE IF: °· You have unusual pain at the catheter insertion site. °· You have redness, warmth, or swelling at the catheter insertion site. °· You have drainage (other than a small amount of blood on the dressing) from the catheter insertion site. °· The catheter insertion site is bleeding, and the bleeding does not stop after 30 minutes of holding steady pressure on the site. °· The area near or just beyond the catheter insertion site becomes pale, cool, tingly, or numb. °  °This information is not intended to replace advice given to you by your health care provider. Make sure you discuss any questions you have with your health care provider. °  °Document Released: 06/11/2005 Document Revised: 12/14/2014 Document Reviewed: 04/26/2013 °Elsevier Interactive Patient Education ©2016 [employer]vier Inc[employer] ° °

## 2017-05-18 ENCOUNTER — Telehealth: Payer: Self-pay

## 2017-05-18 NOTE — Telephone Encounter (Signed)
-----   Message from Gatha Mayer, MD sent at 05/18/2017  9:16 AM EDT ----- Can we find a spot later this month and ask patient what sxs are and what she is taking? ----- Message ----- From: Laverna Peace, RN Sent: 05/12/2017  10:18 AM To: Gatha Mayer, MD  Dr. Carlean Purl,  You have seen my mother in law in the office in the past.  She recently had another ED visit for abdominal pain.  She has such pain and constipation issues.  Would you see her in the office?  Would it be beneficial to get her ED records from [place]?  Thanks, [employer]

## 2017-05-18 NOTE — Telephone Encounter (Signed)
Barbera Setters has made her an appointment for 05/27/17 at 9:15AM. I spoke with Cyril Mourning and she said her mom-in-law is going to try and get her recent ED visit notes.  She has chronic bowel spasms. Takes miralax daily.

## 2017-05-27 ENCOUNTER — Encounter: Payer: Self-pay | Admitting: Internal Medicine

## 2017-05-27 ENCOUNTER — Ambulatory Visit (INDEPENDENT_AMBULATORY_CARE_PROVIDER_SITE_OTHER): Payer: Medicare Other | Admitting: Internal Medicine

## 2017-05-27 VITALS — BP 126/72 | HR 72 | Ht 60.25 in | Wt 202.0 lb

## 2017-05-27 DIAGNOSIS — K56600 Partial intestinal obstruction, unspecified as to cause: Secondary | ICD-10-CM | POA: Diagnosis not present

## 2017-05-27 DIAGNOSIS — K5909 Other constipation: Secondary | ICD-10-CM

## 2017-05-27 NOTE — Patient Instructions (Signed)
Normal BMI (Body Mass Index- based on height and weight) is between 23 and 30. Your BMI today is Body mass index is 39.12 kg/m. Marland Kitchen Please consider follow up  regarding your BMI with your Primary Care Provider.  Please take milk or magnesia and also your metamucil if needed.   Come to [facility] or [facility] if you need a hospital in the future.   We will obtain your records from [facility] for Dr Carlean Purl to review.     I appreciate the opportunity to care for you. Ronney Lion, [facility]

## 2017-05-27 NOTE — Progress Notes (Signed)
Tonya Miranda 76 y.o. [birth year] [id number]  Assessment & Plan:   Encounter Diagnoses  Name Primary?  . Partial small bowel obstruction (HCC) Yes  . Chronic constipation    I think she suffers from a lesions most likely an intermittent partial obstructions loss chronic constipation. I'm not too concerned about her taking milk of magnesia every day and I think she can go back to that plus or minus her Metamucil. I think in 2015 at least from looking at the chart it seems like she could've possibly had a longer period of observation, she had her NG tube and they never connected it. She would like to avoid surgery again and I think that's a good goal.  Come to [facility] or [facility] if she needs hospital again  I appreciate the opportunity to care for this patient. CC: Chriss Czar, MD   Subjective:   Chief Complaint:Constipation with abdominal pain and vomiting  HPI The patient is a very nice 76 year old white woman with chronic constipation. She's had numerous abdominal surgeries including mesh for hernia repairs. In 2015 she had a bowel obstruction and was at a hospital in [facility], and had an exploratory laparotomy and adhesions. I reviewed that out no. CT scan and shown small bowel obstruction. Subsequent to that she did okay, at some point this year her primary care provider asked her to change from her daily milk of magnesia thinking she might be dependent on his using MiraLAX and Metamucil. That hasn't worked as well as she had another episode of.abdominal distention and pain and nausea and vomiting and went to the ER and was treated and released. I don't yet have those records. Allergies  Allergen Reactions  . Latex Dermatitis  . Morphine And Related     Hallucinations, headache   Current Meds  Medication Sig  . acetaminophen (TYLENOL) 500 MG tablet Take 500 mg by mouth every 6 (six) hours as needed for moderate pain.   Marland Kitchen  amLODipine (NORVASC) 2.5 MG tablet Take 5 mg by mouth daily.  . Bilberry, Vaccinium myrtillus, (BILBERRY PO) Take 1 Dose by mouth daily.  . cetirizine (ZYRTEC) 10 MG tablet Take 10 mg by mouth daily.  . diphenhydrAMINE (BENADRYL) 25 MG tablet Take 25 mg by mouth as needed for allergies.   . fluticasone (FLONASE) 50 MCG/ACT nasal spray Place 2 sprays into the nose daily.  Marland Kitchen GELATIN PO Take 1 Dose by mouth daily.  Marland Kitchen ibuprofen (ADVIL,MOTRIN) 200 MG tablet Take 400 mg by mouth every 6 (six) hours as needed for mild pain.   . meloxicam (MOBIC) 7.5 MG tablet Take 7.5 mg by mouth daily. Take for 10 to 14 days as needed for osteoarthritis  . Multiple Vitamin (MULTIVITAMIN WITH MINERALS) TABS Take 1 tablet by mouth daily.  . Omega-3 Fatty Acids (FISH OIL PO) Take 1 Dose by mouth daily.  . pantoprazole (PROTONIX) 40 MG tablet Take 40 mg by mouth daily.  . TURMERIC PO Take 1 Dose by mouth daily.   Past Medical History:  Diagnosis Date  . 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 Chronic headaches   . Diabetes mellitus   . Diverticulosis of sigmoid colon 04/02/2008   Dr. Oretha Ellis  . Fibromyalgia   . GERD (gastroesophageal reflux disease)   . Hypertension   . Osteoarthritis   . Ovarian cancer ([place])   . PONV (postoperative nausea and vomiting)    Past Surgical History:  Procedure Laterality Date  .  ABDOMINAL HYSTERECTOMY     ovarian cancer  . BRAIN SURGERY     aneurysm  . CHOLECYSTECTOMY    . COLONOSCOPY  04/02/2008   Dr. Oretha Ellis, [place].  Marland Kitchen COLONOSCOPY W/ BIOPSIES  09/09/1999   Dr. Durwin Reges Imam  . CRANIECTOMY  08/10/2012   Procedure: CRANIECTOMY FOR TIC DOULOUREUX;  Surgeon: Hosie Spangle, MD;  Location: [place] [facility];  Service: Neurosurgery;  Laterality: Right;  Right Retromastoid Craniectomy with Microvascular Decompression, Decompression of the trigeminal nerve  . ESOPHAGOGASTRODUODENOSCOPY  01/07/2012   Dr. Oretha Ellis, [place].  . HERNIA REPAIR     left inguinal repair and adhesion repair    Social History   Social History  . Marital status: Married    Spouse name: N/A  . Number of children: 3  . 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 Smoking status: Never Smoker  . Smokeless tobacco: Never Used  . Alcohol use Yes     Comment: wine rarely  . Drug use: No  . Sexual activity: Not on file   Other Topics Concern  . Not on file   Social History Narrative  . No narrative on file   family history includes Bone cancer in her sister; Cancer in her brother; Mesothelioma in her sister.   Review of Systems As above. Some fatigue. Irregular review of systems is negative.  Objective:   Physical Exam BP 126/72   Pulse 72   Ht 5' 0.25" (1.53 m)   Wt 202 lb (91.6 kg)   BMI 39.12 kg/m  NAD Obese Eyes anict Lungs cta Cor s1s2 no rmg abd obese w/ multiple scars BS + Appropriate mood and affect

## 2017-07-19 ENCOUNTER — Telehealth: Payer: Self-pay

## 2017-07-19 NOTE — Telephone Encounter (Signed)
Becky added to 07/22/17 schedule and daughter Erasmo Downer will notify her.

## 2017-07-19 NOTE — Telephone Encounter (Signed)
-----   Message from Gatha Mayer, MD sent at 07/19/2017  1:13 PM EDT ----- Regarding: RE: visit It looks like I can see her Thursday at 130. I have copied PJ ----- Message ----- From: Laverna Peace, RN Sent: 07/19/2017  11:22 AM To: Gatha Mayer, MD  Dr. Carlean Purl,  My mother law is having some trouble.  She has been pain for 4 weeks - thought it was in her lower back but her chiropractor does not feel like it is. He feels like the source is somewhere else. She sees him regularly, but this a.m he wouldn't touch her.  He advised her to see her medical doctor.  She just feels like this is "a different pain than I've had before.  It feels like it's across my back and then moves into my lower stomach."  She is moving her bowels.  She is wondering if this pain is GI or muscular related.  Would you be willing to see her sometime this week?  Or do you suggest her see another specialist?  Thanks, Cyril Mourning

## 2017-07-22 ENCOUNTER — Ambulatory Visit (INDEPENDENT_AMBULATORY_CARE_PROVIDER_SITE_OTHER): Payer: Medicare Other | Admitting: Internal Medicine

## 2017-07-22 ENCOUNTER — Other Ambulatory Visit (INDEPENDENT_AMBULATORY_CARE_PROVIDER_SITE_OTHER): Payer: Medicare Other

## 2017-07-22 ENCOUNTER — Encounter (INDEPENDENT_AMBULATORY_CARE_PROVIDER_SITE_OTHER): Payer: Self-pay

## 2017-07-22 ENCOUNTER — Encounter: Payer: Self-pay | Admitting: Internal Medicine

## 2017-07-22 VITALS — BP 134/80 | HR 94 | Ht 61.0 in | Wt 205.0 lb

## 2017-07-22 DIAGNOSIS — R10813 Right lower quadrant abdominal tenderness: Secondary | ICD-10-CM

## 2017-07-22 DIAGNOSIS — Z8543 Personal history of malignant neoplasm of ovary: Secondary | ICD-10-CM | POA: Diagnosis not present

## 2017-07-22 DIAGNOSIS — Z8719 Personal history of other diseases of the digestive system: Secondary | ICD-10-CM

## 2017-07-22 DIAGNOSIS — R1031 Right lower quadrant pain: Secondary | ICD-10-CM | POA: Diagnosis not present

## 2017-07-22 LAB — COMPREHENSIVE METABOLIC PANEL
ALT: 11 U/L (ref 0–35)
AST: 15 U/L (ref 0–37)
Albumin: 3.8 g/dL (ref 3.5–5.2)
Alkaline Phosphatase: 79 U/L (ref 39–117)
BUN: 18 mg/dL (ref 6–23)
CO2: 24 meq/L (ref 19–32)
Calcium: 9.4 mg/dL (ref 8.4–10.5)
Chloride: 108 mEq/L (ref 96–112)
Creatinine, Ser: 1.06 mg/dL (ref 0.40–1.20)
GFR: 53.42 mL/min — AB (ref >60.00)
GLUCOSE: 144 mg/dL — AB (ref 70–99)
POTASSIUM: 3.9 meq/L (ref 3.5–5.1)
Sodium: 139 mEq/L (ref 135–145)
Total Bilirubin: 0.4 mg/dL (ref 0.2–1.2)
Total Protein: 6.7 g/dL (ref 6.0–8.3)

## 2017-07-22 LAB — CBC WITH DIFFERENTIAL/PLATELET
BASOS ABS: 0 10*3/uL (ref 0.0–0.1)
Basophils Relative: 0.5 % (ref 0.0–3.0)
Eosinophils Absolute: 0.1 10*3/uL (ref 0.0–0.7)
Eosinophils Relative: 2.2 % (ref 0.0–5.0)
HCT: 41.9 % (ref 36.0–46.0)
Hemoglobin: 13.7 g/dL (ref 12.0–15.0)
LYMPHS ABS: 1.2 10*3/uL (ref 0.7–4.0)
Lymphocytes Relative: 23.3 % (ref 12.0–46.0)
MCHC: 32.6 g/dL (ref 30.0–36.0)
MCV: 89.8 fl (ref 78.0–100.0)
MONOS PCT: 7.4 % (ref 3.0–12.0)
Monocytes Absolute: 0.4 10*3/uL (ref 0.1–1.0)
NEUTROS ABS: 3.5 10*3/uL (ref 1.4–7.7)
NEUTROS PCT: 66.6 % (ref 43.0–77.0)
PLATELETS: 241 10*3/uL (ref 150.0–400.0)
RBC: 4.67 Mil/uL (ref 3.87–5.11)
RDW: 15.2 % (ref 11.5–15.5)
WBC: 5.2 10*3/uL (ref 4.0–10.5)

## 2017-07-22 NOTE — Progress Notes (Signed)
Tonya Miranda 76 y.o. 1941-10-11 [id number]  Assessment & Plan:   Encounter Diagnoses  Name Primary?  . Right lower quadrant abdominal pain Yes  . Right lower quadrant abdominal tenderness without rebound tenderness   . Hx of ovarian cancer   . History of small bowel obstruction     The picture as to the cause is not entirely clear. There is enough problem here to investigate with a CT abdomen and pelvis given her history of small bowel obstructions history of ovarian cancer. Unlikely to have recurrence of the cancer but it is possible, adhesions quite possible. We'll do that get a CMET and a CBC today as well. There is a background of fibromyalgia and that may be be playing a role.  I appreciate the opportunity to care for this patient. CC: Chriss Czar, MD   Subjective:   Chief Complaint: lower abdominal pain  HPI Several weeks of RLQ pain into back, and into lateral buttock/hip area.  Fairly constant with sharp exacerbations. Not related to eating movement or defecation. Regular defecation now. Has some heartburn. No vomiting or sig nausea  Was seeing chiropractor for back pain - wasn't helping or worsening but he would not manipulate more  1 acetaminophen and 1 ibuprofen together help sxs of pain  Hx ovarian cancer years ago.   Allergies  Allergen Reactions  . Latex Dermatitis  . Morphine And Related     Hallucinations, headache   Current Meds  Medication Sig  . acetaminophen (TYLENOL) 500 MG tablet Take 500 mg by mouth every 6 (six) hours as needed for moderate pain.   Marland Kitchen amLODipine (NORVASC) 2.5 MG tablet Take 5 mg by mouth daily.  . Bilberry, Vaccinium myrtillus, (BILBERRY PO) Take 1 Dose by mouth daily.  . cetirizine (ZYRTEC) 10 MG tablet Take 10 mg by mouth daily.  . diphenhydrAMINE (BENADRYL) 25 MG tablet Take 25 mg by mouth as needed for allergies.   . fluticasone (FLONASE) 50 MCG/ACT nasal spray Place 2 sprays into the nose daily.  Marland Kitchen GELATIN  PO Take 1 Dose by mouth daily.  Marland Kitchen ibuprofen (ADVIL,MOTRIN) 200 MG tablet Take 400 mg by mouth every 6 (six) hours as needed for mild pain.   . meloxicam (MOBIC) 7.5 MG tablet Take 7.5 mg by mouth daily. Take for 10 to 14 days as needed for osteoarthritis  . Multiple Vitamin (MULTIVITAMIN WITH MINERALS) TABS Take 1 tablet by mouth daily.  . Omega-3 Fatty Acids (FISH OIL PO) Take 1 Dose by mouth daily.  . pantoprazole (PROTONIX) 40 MG tablet Take 40 mg by mouth daily.  . TURMERIC PO Take 1 Dose by mouth daily.   Past Medical History:  Diagnosis Date  . 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 Chronic headaches   . Diabetes mellitus   . Diverticulosis of sigmoid colon 04/02/2008   Dr. Oretha Ellis  . Fibromyalgia   . GERD (gastroesophageal reflux disease)   . Hypertension   . Osteoarthritis   . Ovarian cancer ([place])   . PONV (postoperative nausea and vomiting)    Past Surgical History:  Procedure Laterality Date  . ABDOMINAL HYSTERECTOMY     ovarian cancer  . BRAIN SURGERY     aneurysm  . CHOLECYSTECTOMY    . COLONOSCOPY  04/02/2008   Dr. Oretha Ellis, [place].  Marland Kitchen COLONOSCOPY W/ BIOPSIES  09/09/1999   Dr. Durwin Reges Imam  . CRANIECTOMY  08/10/2012   Procedure: CRANIECTOMY FOR TIC DOULOUREUX;  Surgeon: Hosie Spangle,  MD;  Location: [place] [facility];  Service: Neurosurgery;  Laterality: Right;  Right Retromastoid Craniectomy with Microvascular Decompression, Decompression of the trigeminal nerve  . ESOPHAGOGASTRODUODENOSCOPY  01/07/2012   Dr. Oretha Ellis, [place].  . HERNIA REPAIR     left inguinal repair and adhesion repair   Social History   Social History  . Marital status: Married    Spouse name: N/A  . Number of children: 3  . 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 Smoking status: Never Smoker  . Smokeless tobacco: Never Used  . Alcohol use Yes     Comment: wine rarely  . Drug use: No   family history includes Bone  cancer in her sister; Cancer in her brother; Mesothelioma in her sister.   Review of Systems As above  Objective:   Physical Exam BP 134/80 (BP Location: Left Arm, Patient Position: Sitting, Cuff Size: Normal)   Pulse 94   Ht 5\' 1"  (1.549 m)   Wt 205 lb (93 kg)   SpO2 98%   BMI 38.73 kg/m  NAD Obese Back no CVAT or spinal tenderness Is tender on right lateral inferior rib cage Abdomen is tender in RLQ and groin no rebound and no mass - + surgical scars No pain with hip flexion/extension or rotation

## 2017-07-22 NOTE — Patient Instructions (Addendum)
  Your physician has requested that you go to the basement for the following lab work before leaving today: CBC/diff, CMET   You have been scheduled for a CT scan of the abdomen and pelvis at [facility] ([address]---this is in the same building as [facility]).   You are scheduled on 07/23/17 at 1:30pm. You should arrive 15 minutes prior to your appointment time for registration. Please follow the written instructions below on the day of your exam:  WARNING: IF YOU ARE ALLERGIC TO IODINE/X-RAY DYE, PLEASE NOTIFY RADIOLOGY IMMEDIATELY AT [phone]! YOU WILL BE GIVEN A 13 HOUR PREMEDICATION PREP.  1) Do not eat or drink anything after 9:30AM (4 hours prior to your test) 2) You have been given 2 bottles of oral contrast to drink. The solution may taste  better if refrigerated, but do NOT add ice or any other liquid to this solution. Shake well before drinking.    Drink 1 bottle of contrast @ 11:30AM (2 hours prior to your exam)  Drink 1 bottle of contrast @ 12:30PM (1 hour prior to your exam)  You may take any medications as prescribed with a small amount of water except for the following: Metformin, Glucophage, Glucovance, Avandamet, Riomet, Fortamet, Actoplus Met, Janumet, Glumetza or Metaglip. The above medications must be held the day of the exam AND 48 hours after the exam.  The purpose of you drinking the oral contrast is to aid in the visualization of your intestinal tract. The contrast solution may cause some diarrhea. Before your exam is started, you will be given a small amount of fluid to drink. Depending on your individual set of symptoms, you may also receive an intravenous injection of x-ray contrast/dye. Plan on being at [facility] for 30 minutes or longer, depending on the type of exam you are having performed.  This test typically takes 30-45 minutes to complete.  If you have any questions regarding your exam or if you need to reschedule, you  may call the CT department at [phone] between the hours of 8:00 am and 5:00 pm, Monday-Friday.  ________________________________________________________________________  We will call you with results and plans.   I appreciate the opportunity to care for you. Silvano Rusk, MD, [facility]

## 2017-07-23 ENCOUNTER — Other Ambulatory Visit: Payer: Self-pay | Admitting: Internal Medicine

## 2017-07-23 ENCOUNTER — Ambulatory Visit (INDEPENDENT_AMBULATORY_CARE_PROVIDER_SITE_OTHER)
Admission: RE | Admit: 2017-07-23 | Discharge: 2017-07-23 | Disposition: A | Discharge status: Home / Self Care | Payer: Medicare Other | Source: Ambulatory Visit | Attending: Internal Medicine | Admitting: Internal Medicine

## 2017-07-23 DIAGNOSIS — R1031 Right lower quadrant pain: Secondary | ICD-10-CM

## 2017-07-23 MED ORDER — IOPAMIDOL (ISOVUE-300) INJECTION 61%
100.0000 mL | Freq: Once | INTRAVENOUS | Status: AC | PRN
  Administered 2017-07-23: 100 mL via INTRAVENOUS

## 2017-07-26 NOTE — Progress Notes (Signed)
Results discussed w/ daughter-in-law To have colonoscopy Dx is abnormal colon (cecum) on CT Rec sample of Clen-Piq prep [place] Friday aware  Consider adding into 8/24 schedule per [facility] RN management

## 2017-07-27 ENCOUNTER — Telehealth: Payer: Self-pay

## 2017-07-27 DIAGNOSIS — R9389 Abnormal findings on diagnostic imaging of other specified body structures: Secondary | ICD-10-CM

## 2017-07-27 NOTE — Telephone Encounter (Signed)
Tonya Miranda has been instructed for her upcoming colonoscopy, questions answered.

## 2017-08-11 ENCOUNTER — Ambulatory Visit (AMBULATORY_SURGERY_CENTER): Payer: Medicare Other | Admitting: Internal Medicine

## 2017-08-11 ENCOUNTER — Encounter: Payer: Self-pay | Admitting: Internal Medicine

## 2017-08-11 VITALS — BP 157/86 | HR 71 | Temp 97.7°F | Resp 11 | Ht 61.0 in | Wt 205.0 lb

## 2017-08-11 DIAGNOSIS — R933 Abnormal findings on diagnostic imaging of other parts of digestive tract: Secondary | ICD-10-CM | POA: Diagnosis not present

## 2017-08-11 DIAGNOSIS — K5730 Diverticulosis of large intestine without perforation or abscess without bleeding: Secondary | ICD-10-CM | POA: Diagnosis not present

## 2017-08-11 MED ORDER — SODIUM CHLORIDE 0.9 % IV SOLN: 500.0000 mL | INTRAVENOUS | Status: DC

## 2017-08-11 NOTE — Progress Notes (Signed)
To PACU VSS. Report to Santiago Glad, Therapist, sports.tb

## 2017-08-11 NOTE — Op Note (Signed)
[facility] Patient Name: Tonya Miranda Procedure Date: 08/11/2017 7:27 AM MRN: [id number] Endoscopist: Gatha Mayer , MD Age: 76 Referring MD:  Date of Birth: August 28, 1941 Gender: Female Account #: [id number] Procedure:                Colonoscopy Indications:              Abnormal CT of the GI tract Medicines:                Propofol per Anesthesia, Monitored Anesthesia Care Procedure:                Pre-Anesthesia Assessment:                           - Prior to the procedure, a History and Physical                            was performed, and patient medications and                            allergies were reviewed. The patient's tolerance of                            previous anesthesia was also reviewed. The risks                            and benefits of the procedure and the sedation                            options and risks were discussed with the patient.                            All questions were answered, and informed consent                            was obtained. Prior Anticoagulants: The patient has                            taken no previous anticoagulant or antiplatelet                            agents. ASA Grade Assessment: II - A patient with                            mild systemic disease. After reviewing the risks                            and benefits, the patient was deemed in                            satisfactory condition to undergo the procedure.                           After obtaining informed consent, the colonoscope  was passed under direct vision. Throughout the                            procedure, the patient's blood pressure, pulse, and                            oxygen saturations were monitored continuously. The                            Colonoscope was introduced through the anus and                            advanced to the the terminal ileum, with                            identification  of the appendiceal orifice and IC                            valve. The terminal ileum, ileocecal valve,                            appendiceal orifice, and rectum were photographed.                            The quality of the bowel preparation was good. The                            bowel preparation used was Miralax. Scope In: 7:38:20 AM Scope Out: 7:58:27 AM Scope Withdrawal Time: 0 hours 15 minutes 35 seconds  Total Procedure Duration: 0 hours 20 minutes 7 seconds  Findings:                 The perianal and digital rectal examinations were                            normal.                           Multiple small and large-mouthed diverticula were                            found in the left colon. There was no evidence of                            diverticular bleeding.                           The exam was otherwise without abnormality on                            direct and retroflexion views. Complications:            No immediate complications. Estimated blood loss:                            None. Estimated  Blood Loss:     Estimated blood loss: none. Impression:               - Moderate diverticulosis in the left colon. There                            was no evidence of diverticular bleeding.                           - The examination was otherwise normal on direct                            and retroflexion views.                           - No specimens collected.                           - CT finding must have been adherent stool in cecum Recommendation:           - Resume previous diet.                           - Continue present medications.                           - No repeat colonoscopy due to age.                           - will consider pain modulating medications if she                            wishes to try Gatha Mayer, MD 08/11/2017 8:05:48 AM This report has been signed electronically.

## 2017-08-11 NOTE — Patient Instructions (Addendum)
Diverticulosis seen - all else ok. I think the abnormality was stool stuck to the wall.  I will discuss your pain some more - there are some medications we can try if you want - won't fix things but might make life more tolerable.  I appreciate the opportunity to care for you. Gatha Mayer, MD, [facility]  BE SURE AND CHECK YOUR MEDICATIONS. DOUBLE CHECK IF YOU HAVE PROTONIX, (PANTOPRAZOLE). IF YOU DO NOT HAVE THIS MEDICATION , PLEASE NOTIFY DR. Carlean Purl.  HANDOUT GIVEN: DIVERTICULOSIS.  YOU HAD AN ENDOSCOPIC PROCEDURE TODAY AT [facility]:   Refer to the procedure report that was given to you for any specific questions about what was found during the examination.  If the procedure report does not answer your questions, please call your gastroenterologist to clarify.  If you requested that your care partner not be given the details of your procedure findings, then the procedure report has been included in a sealed envelope for you to review at your convenience later.  YOU SHOULD EXPECT: Some feelings of bloating in the abdomen. Passage of more gas than usual.  Walking can help get rid of the air that was put into your GI tract during the procedure and reduce the bloating. If you had a lower endoscopy (such as a colonoscopy or flexible sigmoidoscopy) you may notice spotting of blood in your stool or on the toilet paper. If you underwent a bowel prep for your procedure, you may not have a normal bowel movement for a few days.  Please Note:  You might notice some irritation and congestion in your nose or some drainage.  This is from the oxygen used during your procedure.  There is no need for concern and it should clear up in a day or so.  SYMPTOMS TO REPORT IMMEDIATELY:   Following lower endoscopy (colonoscopy or flexible sigmoidoscopy):  Excessive amounts of blood in the stool  Significant tenderness or worsening of abdominal pains  Swelling of the abdomen that is new,  acute  Fever of 100F or higher   For urgent or emergent issues, a gastroenterologist can be reached at any hour by calling [phone].   DIET:  We do recommend a small meal at first, but then you may proceed to your regular diet.  Drink plenty of fluids but you should avoid alcoholic beverages for 24 hours.  ACTIVITY:  You should plan to take it easy for the rest of today and you should NOT DRIVE or use heavy machinery until tomorrow (because of the sedation medicines used during the test).    FOLLOW UP: Our staff will call the number listed on your records the next business day following your procedure to check on you and address any questions or concerns that you may have regarding the information given to you following your procedure. If we do not reach you, we will leave a message.  However, if you are feeling well and you are not experiencing any problems, there is no need to return our call.  We will assume that you have returned to your regular daily activities without incident.  If any biopsies were taken you will be contacted by phone or by letter within the next 1-3 weeks.  Please call us at [phone] if you have not heard about the biopsies in 3 weeks.    SIGNATURES/CONFIDENTIALITY: You and/or your care partner have signed paperwork which will be entered into your electronic medical record.  These signatures attest  to the fact that that the information above on your After Visit Summary has been reviewed and is understood.  Full responsibility of the confidentiality of this discharge information lies with you and/or your care-partner. 

## 2017-08-12 ENCOUNTER — Telehealth: Payer: Self-pay | Admitting: *Deleted

## 2017-08-12 NOTE — Telephone Encounter (Signed)
  Follow up Call-  Call back number 08/11/2017  Post procedure Call Back phone  # [phone]  Permission to leave phone message Yes  Some recent data might be hidden     Patient questions:  Do you have a fever, pain , or abdominal swelling? No. Pain Score  0 *  Have you tolerated food without any problems? Yes.    Have you been able to return to your normal activities? Yes.    Do you have any questions about your discharge instructions: Diet   No. Medications  Yes.   Follow up visit  No.  Do you have questions or concerns about your Care? No.  Actions: * If pain score is 4 or above: No action needed, pain <4. States her omeprazole is out of date. Her primary care provider prescribed.  Advised her to be in contact with that office for rx. Renewal.

## 2017-09-01 ENCOUNTER — Encounter (HOSPITAL_COMMUNITY): Payer: Self-pay | Admitting: Family Medicine

## 2017-09-01 ENCOUNTER — Emergency Department (HOSPITAL_COMMUNITY)
Admission: EM | Admit: 2017-09-01 | Discharge: 2017-09-01 | Disposition: A | Discharge status: Home / Self Care | Payer: Medicare Other | Attending: Emergency Medicine | Admitting: Emergency Medicine

## 2017-09-01 ENCOUNTER — Emergency Department (HOSPITAL_COMMUNITY): Payer: Medicare Other

## 2017-09-01 DIAGNOSIS — R109 Unspecified abdominal pain: Secondary | ICD-10-CM | POA: Diagnosis present

## 2017-09-01 DIAGNOSIS — I10 Essential (primary) hypertension: Secondary | ICD-10-CM | POA: Diagnosis not present

## 2017-09-01 DIAGNOSIS — Z8543 Personal history of malignant neoplasm of ovary: Secondary | ICD-10-CM | POA: Insufficient documentation

## 2017-09-01 DIAGNOSIS — R112 Nausea with vomiting, unspecified: Secondary | ICD-10-CM | POA: Diagnosis not present

## 2017-09-01 DIAGNOSIS — Z79899 Other long term (current) drug therapy: Secondary | ICD-10-CM | POA: Insufficient documentation

## 2017-09-01 DIAGNOSIS — R1084 Generalized abdominal pain: Secondary | ICD-10-CM | POA: Diagnosis not present

## 2017-09-01 DIAGNOSIS — Z8719 Personal history of other diseases of the digestive system: Secondary | ICD-10-CM | POA: Diagnosis not present

## 2017-09-01 DIAGNOSIS — E119 Type 2 diabetes mellitus without complications: Secondary | ICD-10-CM | POA: Diagnosis not present

## 2017-09-01 LAB — COMPREHENSIVE METABOLIC PANEL
ALT: 17 U/L (ref 14–54)
ANION GAP: 10 (ref 5–15)
AST: 24 U/L (ref 15–41)
Albumin: 3.9 g/dL (ref 3.5–5.0)
Alkaline Phosphatase: 77 U/L (ref 38–126)
BUN: 24 mg/dL — ABNORMAL HIGH (ref 6–20)
CALCIUM: 9.3 mg/dL (ref 8.9–10.3)
CHLORIDE: 109 mmol/L (ref 101–111)
CO2: 23 mmol/L (ref 22–32)
CREATININE: 0.81 mg/dL (ref 0.44–1.00)
Glucose, Bld: 147 mg/dL — ABNORMAL HIGH (ref 65–99)
Potassium: 4 mmol/L (ref 3.5–5.1)
Sodium: 142 mmol/L (ref 135–145)
Total Bilirubin: 0.5 mg/dL (ref 0.3–1.2)
Total Protein: 7.1 g/dL (ref 6.5–8.1)

## 2017-09-01 LAB — CBC
HCT: 37.6 % (ref 36.0–46.0)
Hemoglobin: 12.5 g/dL (ref 12.0–15.0)
MCH: 29.1 pg (ref 26.0–34.0)
MCHC: 33.2 g/dL (ref 30.0–36.0)
MCV: 87.4 fL (ref 78.0–100.0)
PLATELETS: 219 10*3/uL (ref 150–400)
RBC: 4.3 MIL/uL (ref 3.87–5.11)
RDW: 14 % (ref 11.5–15.5)
WBC: 8.9 10*3/uL (ref 4.0–10.5)

## 2017-09-01 LAB — LIPASE, BLOOD: LIPASE: 19 U/L (ref 11–51)

## 2017-09-01 MED ORDER — IOPAMIDOL (ISOVUE-300) INJECTION 61%
INTRAVENOUS | Status: AC
  Filled 2017-09-01: qty 100

## 2017-09-01 MED ORDER — ONDANSETRON 4 MG PO TBDP
4.0000 mg | ORAL_TABLET | Freq: Once | ORAL | Status: AC | PRN
  Administered 2017-09-01: 4 mg via ORAL
  Filled 2017-09-01: qty 1

## 2017-09-01 MED ORDER — ONDANSETRON HCL 4 MG/2ML IJ SOLN
4.0000 mg | Freq: Once | INTRAMUSCULAR | Status: AC
  Administered 2017-09-01: 4 mg via INTRAVENOUS
  Filled 2017-09-01: qty 2

## 2017-09-01 MED ORDER — FENTANYL CITRATE (PF) 100 MCG/2ML IJ SOLN
50.0000 ug | Freq: Once | INTRAMUSCULAR | Status: AC
  Administered 2017-09-01: 50 ug via INTRAVENOUS
  Filled 2017-09-01: qty 2

## 2017-09-01 MED ORDER — IOPAMIDOL (ISOVUE-300) INJECTION 61%
100.0000 mL | Freq: Once | INTRAVENOUS | Status: AC | PRN
  Administered 2017-09-01: 100 mL via INTRAVENOUS

## 2017-09-01 MED ORDER — ONDANSETRON 8 MG PO TBDP: ORAL_TABLET | ORAL | 0 refills | Status: DC

## 2017-09-01 NOTE — ED Provider Notes (Signed)
[place] DEPT Provider Note   CSN: [id number] Arrival date & time: 09/01/17  0203     History   Chief Complaint Chief Complaint  Patient presents with  . Abdominal Pain  . Emesis    HPI Tonya Miranda is a 76 y.o. female.  Patient is a 76 year old female with past medical history of recurrent small bowel obstructions, fibromyalgia, headaches. She presents for evaluation of abdominal pain and vomiting. This started yesterday evening and is worsening. She denies any light stool or vomit. She denies any fevers or chills.   The history is provided by the patient.  Abdominal Pain   This is a recurrent problem. The current episode started 3 to 5 hours ago. The problem occurs constantly. The problem has been rapidly worsening. The pain is associated with an unknown factor. The pain is located in the epigastric region and periumbilical region. Associated symptoms include nausea. Pertinent negatives include melena and constipation. Nothing aggravates the symptoms. Nothing relieves the symptoms.    Past Medical History:  Diagnosis Date  . 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 Chronic headaches   . Diabetes mellitus   . Diverticulosis of sigmoid colon 04/02/2008   Dr. Oretha Ellis  . Fibromyalgia   . GERD (gastroesophageal reflux disease)   . Hypertension   . Osteoarthritis   . Ovarian cancer ([place])   . PONV (postoperative nausea and vomiting)     Patient Active Problem List   Diagnosis Date Noted  . Cyclic vomiting syndrome 10/09/2013  . 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 Serum calcium elevated 10/09/2013    Past Surgical History:  Procedure Laterality Date  . ABDOMINAL HYSTERECTOMY     ovarian cancer  . BRAIN SURGERY     aneurysm  . CHOLECYSTECTOMY    . COLONOSCOPY  04/02/2008   Dr. Oretha Ellis, [place].  Marland Kitchen COLONOSCOPY W/ BIOPSIES  09/09/1999   Dr. Durwin Reges Imam  . CRANIECTOMY  08/10/2012   Procedure: CRANIECTOMY FOR TIC DOULOUREUX;  Surgeon: Hosie Spangle, MD;  Location: [facility];  Service: Neurosurgery;  Laterality: Right;  Right Retromastoid Craniectomy with Microvascular Decompression, Decompression of the trigeminal nerve  . ESOPHAGOGASTRODUODENOSCOPY  01/07/2012   Dr. Oretha Ellis, [place].  . HERNIA REPAIR     left inguinal repair and adhesion repair    OB History    No data available       Home Medications    Prior to Admission medications   Medication Sig Start Date End Date Taking? Authorizing Provider  acetaminophen (TYLENOL) 500 MG tablet Take 500 mg by mouth every 6 (six) hours as needed for moderate pain.    Yes [provider]  amLODipine (NORVASC) 5 MG tablet Take 5 mg by mouth daily.  08/26/17  Yes [provider]  cetirizine (ZYRTEC) 10 MG tablet Take 10 mg by mouth daily.   Yes [provider]  diphenhydrAMINE (BENADRYL) 25 MG tablet Take 25 mg by mouth as needed for allergies.    Yes [provider]  fluticasone (FLONASE) 50 MCG/ACT nasal spray Place 2 sprays into the nose daily.   Yes [provider]  GELATIN PO Take 1 Dose by mouth daily.   Yes [provider]  ibuprofen (ADVIL,MOTRIN) 200 MG tablet Take 200 mg by mouth every 6 (six) hours as needed for mild pain.    Yes [provider]  Multiple Vitamin (MULTIVITAMIN WITH MINERALS) TABS Take 1 tablet by mouth daily.   Yes [provider]  pantoprazole (PROTONIX) 40 MG tablet Take 40 mg by mouth daily as needed. Heart Burn   Yes [provider]  TURMERIC PO Take 1 Dose by mouth daily as needed. Arthritis   Yes [provider]    Family History Family History  Problem Relation Age of Onset  . Mesothelioma Sister   . Bone cancer Sister   . Cancer Brother        unknown  . Colon cancer Neg Hx   . Esophageal cancer Neg Hx     Social History Social History  Substance Use Topics  . Smoking status: Never Smoker  . Smokeless tobacco: Never Used  . Alcohol use Yes     Comment: wine rarely      Allergies   Latex and Morphine and related   Review of Systems Review of Systems  Gastrointestinal: Positive for nausea. Negative for constipation and melena.  All other systems reviewed and are negative.    Physical Exam Updated Vital Signs BP (!) 154/71 (BP Location: Left Arm)   Pulse (!) 102   Temp (!) 97.4 F (36.3 C) (Oral)   Resp 20   Ht 5\' 1"  (1.549 m)   Wt 90.7 kg (200 lb)   SpO2 100%   BMI 37.79 kg/m   Physical Exam  Constitutional: She is oriented to person, place, and time. She appears well-developed and well-nourished. No distress.  HENT:  Head: Normocephalic and atraumatic.  Neck: Normal range of motion. Neck supple.  Cardiovascular: Normal rate and regular rhythm.  Exam reveals no gallop and no friction rub.   No murmur heard. Pulmonary/Chest: Effort normal and breath sounds normal. No respiratory distress. She has no wheezes.  Abdominal: Soft. Bowel sounds are normal. She exhibits no distension. There is tenderness.  There is mild generalized abdominal tenderness, however no rebound or guarding.  Musculoskeletal: Normal range of motion.  Neurological: She is alert and oriented to person, place, and time.  Skin: Skin is warm and dry. She is not diaphoretic.  Nursing note and vitals reviewed.    ED Treatments / Results  Labs (all labs ordered are listed, but only abnormal results are displayed) Labs Reviewed  LIPASE, BLOOD  COMPREHENSIVE METABOLIC PANEL  CBC  URINALYSIS, ROUTINE W REFLEX MICROSCOPIC    EKG  EKG Interpretation None       Radiology No results found.  Procedures Procedures (including critical care time)  Medications Ordered in ED Medications  ondansetron (ZOFRAN) injection 4 mg (not administered)  fentaNYL (SUBLIMAZE) injection 50 mcg (not administered)  ondansetron (ZOFRAN-ODT) disintegrating tablet 4 mg (4 mg Oral Given 09/01/17 0323)     Initial Impression / Assessment and Plan / ED Course  I have reviewed  the triage vital signs and the nursing notes.  Pertinent labs & imaging results that were available during my care of the patient were reviewed by me and considered in my medical decision making (see chart for details).  Patient with history of recurrent small bowel obstructions related to adhesions and prior surgery. She presents today with abdominal pain and vomiting. Her workup reveals no evidence for obstruction on CT scan and laboratory studies are reassuring. There is no other that appears emergent identified on the CT scan.  She tells me while in the waiting room that she felt as though the obstruction may have "let loose and resolved". There appears nothing emergent and she is feeling better. She will be discharged with Zofran and return as needed.  Final Clinical Impressions(s) /  ED Diagnoses   Final diagnoses:  None    New Prescriptions New Prescriptions   No medications on file     Veryl Speak, MD 09/01/17 [phone]

## 2017-09-01 NOTE — Discharge Instructions (Signed)
Zofran as prescribed as needed for nausea.  Return to the emergency department if you develop worsening pain, worsening vomiting, bloody stools, high fevers, or other new and concerning symptoms.

## 2017-09-01 NOTE — ED Triage Notes (Signed)
Patient is complaining of mid abd pain that started around 19:00 after eating dinner. Also, patient has had nausea and vomiting x 2. Patient did have a bag with emesis in it when walking into triage room.

## 2018-02-14 ENCOUNTER — Other Ambulatory Visit (HOSPITAL_COMMUNITY): Payer: Self-pay | Admitting: Interventional Radiology

## 2018-02-14 DIAGNOSIS — I729 Aneurysm of unspecified site: Secondary | ICD-10-CM

## 2018-02-25 ENCOUNTER — Ambulatory Visit (HOSPITAL_COMMUNITY): Payer: Medicare Other

## 2018-02-25 ENCOUNTER — Ambulatory Visit (HOSPITAL_COMMUNITY)
Admission: RE | Admit: 2018-02-25 | Discharge: 2018-02-25 | Disposition: A | Discharge status: Home / Self Care | Payer: Medicare Other | Source: Ambulatory Visit | Attending: Interventional Radiology | Admitting: Interventional Radiology

## 2018-02-25 ENCOUNTER — Encounter (HOSPITAL_COMMUNITY): Payer: Self-pay | Admitting: Radiology

## 2018-02-25 DIAGNOSIS — I639 Cerebral infarction, unspecified: Secondary | ICD-10-CM | POA: Diagnosis not present

## 2018-02-25 DIAGNOSIS — I671 Cerebral aneurysm, nonruptured: Secondary | ICD-10-CM | POA: Insufficient documentation

## 2018-02-25 DIAGNOSIS — I6782 Cerebral ischemia: Secondary | ICD-10-CM | POA: Diagnosis not present

## 2018-02-25 DIAGNOSIS — I729 Aneurysm of unspecified site: Secondary | ICD-10-CM

## 2018-02-25 LAB — CREATININE, SERUM
Creatinine, Ser: 0.91 mg/dL (ref 0.44–1.00)
GFR calc Af Amer: >60 mL/min (ref >60)
GFR calc non Af Amer: 59 mL/min — ABNORMAL LOW (ref >60)

## 2018-02-25 MED ORDER — GADOBENATE DIMEGLUMINE 529 MG/ML IV SOLN
20.0000 mL | Freq: Once | INTRAVENOUS | Status: AC
  Administered 2018-02-25: 19 mL via INTRAVENOUS

## 2018-03-31 ENCOUNTER — Telehealth (HOSPITAL_COMMUNITY): Payer: Self-pay

## 2018-03-31 NOTE — Telephone Encounter (Signed)
Pt agreed to f/u in 2 years with [facility]. AW

## 2018-04-07 ENCOUNTER — Inpatient Hospital Stay (HOSPITAL_COMMUNITY): Payer: Medicare Other

## 2018-04-07 ENCOUNTER — Emergency Department (HOSPITAL_COMMUNITY): Payer: Medicare Other

## 2018-04-07 ENCOUNTER — Encounter (HOSPITAL_COMMUNITY): Payer: Self-pay | Admitting: Emergency Medicine

## 2018-04-07 ENCOUNTER — Inpatient Hospital Stay (HOSPITAL_COMMUNITY)
Admission: EM | Admit: 2018-04-07 | Discharge: 2018-04-11 | DRG: 394 | Disposition: A | Discharge status: Home / Self Care | Payer: Medicare Other | Attending: Internal Medicine | Admitting: Internal Medicine

## 2018-04-07 ENCOUNTER — Other Ambulatory Visit: Payer: Self-pay

## 2018-04-07 DIAGNOSIS — E860 Dehydration: Secondary | ICD-10-CM | POA: Diagnosis present

## 2018-04-07 DIAGNOSIS — Z6841 Body Mass Index (BMI) 40.0 and over, adult: Secondary | ICD-10-CM

## 2018-04-07 DIAGNOSIS — K219 Gastro-esophageal reflux disease without esophagitis: Secondary | ICD-10-CM | POA: Diagnosis present

## 2018-04-07 DIAGNOSIS — F419 Anxiety disorder, unspecified: Secondary | ICD-10-CM | POA: Diagnosis not present

## 2018-04-07 DIAGNOSIS — D649 Anemia, unspecified: Secondary | ICD-10-CM | POA: Diagnosis not present

## 2018-04-07 DIAGNOSIS — M797 Fibromyalgia: Secondary | ICD-10-CM | POA: Diagnosis present

## 2018-04-07 DIAGNOSIS — Z885 Allergy status to narcotic agent status: Secondary | ICD-10-CM

## 2018-04-07 DIAGNOSIS — E119 Type 2 diabetes mellitus without complications: Secondary | ICD-10-CM | POA: Diagnosis present

## 2018-04-07 DIAGNOSIS — M1990 Unspecified osteoarthritis, unspecified site: Secondary | ICD-10-CM | POA: Diagnosis present

## 2018-04-07 DIAGNOSIS — K436 Other and unspecified ventral hernia with obstruction, without gangrene: Secondary | ICD-10-CM | POA: Diagnosis present

## 2018-04-07 DIAGNOSIS — Z8543 Personal history of malignant neoplasm of ovary: Secondary | ICD-10-CM | POA: Diagnosis not present

## 2018-04-07 DIAGNOSIS — Z0189 Encounter for other specified special examinations: Secondary | ICD-10-CM

## 2018-04-07 DIAGNOSIS — Z79899 Other long term (current) drug therapy: Secondary | ICD-10-CM | POA: Diagnosis not present

## 2018-04-07 DIAGNOSIS — E669 Obesity, unspecified: Secondary | ICD-10-CM | POA: Diagnosis present

## 2018-04-07 DIAGNOSIS — N179 Acute kidney failure, unspecified: Secondary | ICD-10-CM | POA: Diagnosis present

## 2018-04-07 DIAGNOSIS — K56609 Unspecified intestinal obstruction, unspecified as to partial versus complete obstruction: Secondary | ICD-10-CM

## 2018-04-07 DIAGNOSIS — I10 Essential (primary) hypertension: Secondary | ICD-10-CM | POA: Diagnosis present

## 2018-04-07 DIAGNOSIS — Z91040 Latex allergy status: Secondary | ICD-10-CM | POA: Diagnosis not present

## 2018-04-07 LAB — CBC
HEMATOCRIT: 43.5 % (ref 36.0–46.0)
Hemoglobin: 14.2 g/dL (ref 12.0–15.0)
MCH: 27.6 pg (ref 26.0–34.0)
MCHC: 32.6 g/dL (ref 30.0–36.0)
MCV: 84.6 fL (ref 78.0–100.0)
PLATELETS: 267 10*3/uL (ref 150–400)
RBC: 5.14 MIL/uL — ABNORMAL HIGH (ref 3.87–5.11)
RDW: 16 % — AB (ref 11.5–15.5)
WBC: 8.5 10*3/uL (ref 4.0–10.5)

## 2018-04-07 LAB — COMPREHENSIVE METABOLIC PANEL
ALBUMIN: 4.4 g/dL (ref 3.5–5.0)
ALT: 17 U/L (ref 14–54)
AST: 24 U/L (ref 15–41)
Alkaline Phosphatase: 105 U/L (ref 38–126)
Anion gap: 13 (ref 5–15)
BUN: 28 mg/dL — AB (ref 6–20)
CHLORIDE: 104 mmol/L (ref 101–111)
CO2: 24 mmol/L (ref 22–32)
CREATININE: 1.1 mg/dL — AB (ref 0.44–1.00)
Calcium: 9.9 mg/dL (ref 8.9–10.3)
GFR calc Af Amer: 55 mL/min — ABNORMAL LOW (ref >60)
GFR, EST NON AFRICAN AMERICAN: 47 mL/min — AB (ref >60)
GLUCOSE: 132 mg/dL — AB (ref 65–99)
Potassium: 4 mmol/L (ref 3.5–5.1)
SODIUM: 141 mmol/L (ref 135–145)
Total Bilirubin: 1 mg/dL (ref 0.3–1.2)
Total Protein: 8.3 g/dL — ABNORMAL HIGH (ref 6.5–8.1)

## 2018-04-07 LAB — HEMOGLOBIN A1C
HEMOGLOBIN A1C: 6.2 % — AB (ref 4.8–5.6)
MEAN PLASMA GLUCOSE: 131.24 mg/dL

## 2018-04-07 LAB — LIPASE, BLOOD: LIPASE: 25 U/L (ref 11–51)

## 2018-04-07 MED ORDER — DIATRIZOATE MEGLUMINE & SODIUM 66-10 % PO SOLN
90.0000 mL | Freq: Once | ORAL | Status: AC
  Administered 2018-04-07: 90 mL via NASOGASTRIC
  Filled 2018-04-07: qty 90

## 2018-04-07 MED ORDER — IOPAMIDOL (ISOVUE-300) INJECTION 61%
100.0000 mL | Freq: Once | INTRAVENOUS | Status: AC | PRN
  Administered 2018-04-07: 100 mL via INTRAVENOUS

## 2018-04-07 MED ORDER — FAMOTIDINE IN NACL 20-0.9 MG/50ML-% IV SOLN
20.0000 mg | Freq: Two times a day (BID) | INTRAVENOUS | Status: DC
  Administered 2018-04-07 – 2018-04-10 (×6): 20 mg via INTRAVENOUS
  Filled 2018-04-07 (×6): qty 50

## 2018-04-07 MED ORDER — FENTANYL CITRATE (PF) 100 MCG/2ML IJ SOLN
50.0000 ug | Freq: Four times a day (QID) | INTRAMUSCULAR | Status: DC | PRN
  Administered 2018-04-07 – 2018-04-09 (×3): 50 ug via INTRAVENOUS
  Filled 2018-04-07 (×3): qty 2

## 2018-04-07 MED ORDER — FENTANYL CITRATE (PF) 100 MCG/2ML IJ SOLN
50.0000 ug | Freq: Once | INTRAMUSCULAR | Status: AC
  Administered 2018-04-07: 50 ug via INTRAVENOUS
  Filled 2018-04-07: qty 2

## 2018-04-07 MED ORDER — HYDRALAZINE HCL 20 MG/ML IJ SOLN: 10.0000 mg | Freq: Four times a day (QID) | INTRAMUSCULAR | Status: DC | PRN

## 2018-04-07 MED ORDER — ONDANSETRON HCL 4 MG PO TABS: 4.0000 mg | ORAL_TABLET | Freq: Four times a day (QID) | ORAL | Status: DC | PRN

## 2018-04-07 MED ORDER — SODIUM CHLORIDE 0.9 % IV SOLN
INTRAVENOUS | Status: DC
  Administered 2018-04-07 – 2018-04-09 (×3): via INTRAVENOUS
  Administered 2018-04-09: 1000 mL via INTRAVENOUS
  Administered 2018-04-10: 12:00:00 via INTRAVENOUS

## 2018-04-07 MED ORDER — MORPHINE SULFATE (PF) 2 MG/ML IV SOLN
2.0000 mg | INTRAVENOUS | Status: DC | PRN
  Filled 2018-04-07: qty 1

## 2018-04-07 MED ORDER — LORAZEPAM 2 MG/ML IJ SOLN
1.0000 mg | Freq: Once | INTRAMUSCULAR | Status: AC
  Administered 2018-04-07: 1 mg via INTRAVENOUS
  Filled 2018-04-07: qty 1

## 2018-04-07 MED ORDER — ONDANSETRON HCL 4 MG/2ML IJ SOLN
4.0000 mg | Freq: Four times a day (QID) | INTRAMUSCULAR | Status: DC | PRN
  Administered 2018-04-07: 4 mg via INTRAVENOUS
  Filled 2018-04-07: qty 2

## 2018-04-07 MED ORDER — IOHEXOL 300 MG/ML  SOLN
30.0000 mL | Freq: Once | INTRAMUSCULAR | Status: AC | PRN
  Administered 2018-04-07: 30 mL via ORAL

## 2018-04-07 MED ORDER — ONDANSETRON HCL 4 MG/2ML IJ SOLN: 4.0000 mg | Freq: Four times a day (QID) | INTRAMUSCULAR | Status: DC | PRN

## 2018-04-07 MED ORDER — PROMETHAZINE HCL 25 MG/ML IJ SOLN
6.2500 mg | Freq: Four times a day (QID) | INTRAMUSCULAR | Status: DC | PRN
  Administered 2018-04-07: 6.25 mg via INTRAVENOUS
  Filled 2018-04-07 (×2): qty 1

## 2018-04-07 MED ORDER — LIDOCAINE HCL URETHRAL/MUCOSAL 2 % EX GEL
1.0000 "application " | Freq: Once | CUTANEOUS | Status: AC
  Administered 2018-04-07: 1 via TOPICAL
  Filled 2018-04-07: qty 5

## 2018-04-07 MED ORDER — PROMETHAZINE HCL 25 MG/ML IJ SOLN
12.5000 mg | Freq: Once | INTRAMUSCULAR | Status: AC
  Administered 2018-04-07: 12.5 mg via INTRAVENOUS
  Filled 2018-04-07 (×2): qty 1

## 2018-04-07 MED ORDER — LORAZEPAM 2 MG/ML IJ SOLN
1.0000 mg | Freq: Four times a day (QID) | INTRAMUSCULAR | Status: DC | PRN
  Administered 2018-04-08: 1 mg via INTRAVENOUS
  Filled 2018-04-07: qty 1

## 2018-04-07 MED ORDER — ONDANSETRON HCL 4 MG/2ML IJ SOLN
4.0000 mg | Freq: Once | INTRAMUSCULAR | Status: AC
  Administered 2018-04-07: 4 mg via INTRAVENOUS
  Filled 2018-04-07: qty 2

## 2018-04-07 NOTE — ED Notes (Signed)
Patient currently vomiting during phenergan administration. Oxygen also applied because of low oxygen saturation reading after Fentanyl administration.

## 2018-04-07 NOTE — ED Triage Notes (Signed)
Pt verbalizes RLQ pain; denies n/v but verbalizes "it's normal for me to have diarrhea."

## 2018-04-07 NOTE — ED Notes (Signed)
Attempted IV access, unsuccessful.  

## 2018-04-07 NOTE — Consult Note (Addendum)
[facility] Surgery Consult Note  DEJANAE HELSER [birth year]  [id number].    Requesting MD: Varney Biles Chief Complaint/Reason for Consult: SBO  HPI:  Tonya Miranda is a 77yo female with prior history of multiple abdominal surgeries including hysterectomy 2001 for ovarian cancer, cholecystectomy, 8-13 ventral hernia repairs, and most recently an ex lap LOA for SBO ~2016, who presented to [facility] earlier today with 1 day of RLQ abdominal pain. States that the pain is constant and severe. Worse with movement. She was a little nauseated but did not vomit until today when trying to drink oral contrast for CT scan. She had a normal BM this morning. Not passing flatus, but states that she typically does not have much flatus. In the ED her CT showed small bowel obstruction with transition zone at the level of the distal jejunum with no free air or abscess; also seen is a small right paracentral ventral hernia containing bowel but with no bowel compromise. WBC 8.5, Cr 1.10, VSS. General surgery asked to consult. Patient is NPO today.  -PMH significant for h/o ovarian cancer, HTN, GERD, h/o Left internal carotid artery aneurysm stent/coiling 2013 -Abdominal surgical history: hysterectomy 2001, cholecystectomy, 8-13 ventral hernia repairs, ex lap LOA for SBO ~2016: all surgeries done by Dr. Lavone Neri in [place], [place] -Anticoagulants: none -Nonsmoker -Lives at home with husband  ROS: Review of Systems  Constitutional: Negative.   HENT: Negative.   Eyes: Negative.   Respiratory: Negative.   Cardiovascular: Negative.   Gastrointestinal: Positive for abdominal pain, nausea and vomiting. Negative for constipation and diarrhea.  Genitourinary: Negative.   Musculoskeletal: Negative.   Skin: Negative.   Neurological: Negative.    All systems reviewed and otherwise negative except for as above  Family History  Problem Relation Age of Onset  . Mesothelioma Sister   . Bone cancer Sister    . Cancer Brother        unknown  . Colon cancer Neg Hx   . Esophageal cancer Neg Hx     Past Medical History:  Diagnosis Date  . 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 Chronic headaches   . Diabetes mellitus   . Diverticulosis of sigmoid colon 04/02/2008   Dr. Oretha Ellis  . Fibromyalgia   . GERD (gastroesophageal reflux disease)   . Hypertension   . Osteoarthritis   . Ovarian cancer ([place])   . PONV (postoperative nausea and vomiting)     Past Surgical History:  Procedure Laterality Date  . ABDOMINAL HYSTERECTOMY     ovarian cancer  . BRAIN SURGERY     aneurysm  . CHOLECYSTECTOMY    . COLONOSCOPY  04/02/2008   Dr. Oretha Ellis, [place].  Marland Kitchen COLONOSCOPY W/ BIOPSIES  09/09/1999   Dr. Durwin Reges Imam  . CRANIECTOMY  08/10/2012   Procedure: CRANIECTOMY FOR TIC DOULOUREUX;  Surgeon: Hosie Spangle, MD;  Location: [facility];  Service: Neurosurgery;  Laterality: Right;  Right Retromastoid Craniectomy with Microvascular Decompression, Decompression of the trigeminal nerve  . ESOPHAGOGASTRODUODENOSCOPY  01/07/2012   Dr. Oretha Ellis, [place].  . HERNIA REPAIR     left inguinal repair and adhesion repair    Social History:  reports that she has never smoked. She has never used smokeless tobacco. She reports that she drinks alcohol. She reports that she does not use drugs.  Allergies:  Allergies  Allergen Reactions  . Latex Dermatitis  . Morphine And Related     Hallucinations, headache     (Not  in a hospital admission)  Prior to Admission medications   Medication Sig Start Date End Date Taking? Authorizing Provider  acetaminophen (TYLENOL) 500 MG tablet Take 500 mg by mouth every 6 (six) hours as needed for moderate pain.     [provider]  amLODipine (NORVASC) 5 MG tablet Take 5 mg by mouth daily.  08/26/17   [provider]  cetirizine (ZYRTEC) 10 MG tablet Take 10 mg by mouth daily.    [provider]  diphenhydrAMINE (BENADRYL) 25 MG tablet Take 25 mg by  mouth as needed for allergies.     [provider]  fluticasone (FLONASE) 50 MCG/ACT nasal spray Place 2 sprays into the nose daily.    [provider]  GELATIN PO Take 1 Dose by mouth daily.    [provider]  ibuprofen (ADVIL,MOTRIN) 200 MG tablet Take 200 mg by mouth every 6 (six) hours as needed for mild pain.     [provider]  Multiple Vitamin (MULTIVITAMIN WITH MINERALS) TABS Take 1 tablet by mouth daily.    [provider]  ondansetron (ZOFRAN ODT) 8 MG disintegrating tablet 42m ODT q4 hours prn nausea Patient not taking: Reported on 04/07/2018 09/01/17   DVeryl Speak MD  pantoprazole (PROTONIX) 40 MG tablet Take 40 mg by mouth daily as needed. Heart Burn    [provider]  TURMERIC PO Take 1 Dose by mouth daily as needed. Arthritis    [provider]    Blood pressure (!) 113/52, pulse 77, temperature 97.9 F (36.6 C), temperature source Oral, resp. rate 16, SpO2 97 %. Physical Exam: General: pleasant, WD/WN white female who is laying in bed in NAD HEENT: head is normocephalic, atraumatic.  Sclera are noninjected.  Pupils equal and round.  Ears and nose without any masses or lesions.  Mouth is pink and moist. Dentition fair  Heart: regular, rate, and rhythm.  No obvious murmurs, gallops, or rubs noted.  Palpable pedal pulses bilaterally Lungs: CTAB, no wheezes, rhonchi, or rales noted.  Respiratory effort nonlabored Abd: well healed midline incision with few small facial defects noted, soft, ?distended, +BS, no masses or organomegaly. TTP right hemiabdomen without rebound or guarding. Ventral hernia containing SB seen on CT scan feels reduced MS: mild BLE edema Skin: warm and dry with no masses, lesions, or rashes Psych: A&Ox3 with an appropriate affect. Neuro: cranial nerves grossly intact, extremity CSM intact bilaterally, normal speech  Results for orders placed or performed during the hospital encounter of 04/07/18  (from the past 48 hour(s))  Lipase, blood     Status: None   Collection Time: 04/07/18 10:13 AM  Result Value Ref Range   Lipase 25 11 - 51 U/L    Comment: Performed [facility] [address] [place] [id number] Comprehensive metabolic panel     Status: Abnormal   Collection Time: 04/07/18 10:13 AM  Result Value Ref Range   Sodium 141 135 - 145 mmol/L   Potassium 4.0 3.5 - 5.1 mmol/L   Chloride 104 101 - 111 mmol/L   CO2 24 22 - 32 mmol/L   Glucose, Bld 132 (H) 65 - 99 mg/dL   BUN 28 (H) 6 - 20 mg/dL   Creatinine, Ser 1.10 (H) 0.44 - 1.00 mg/dL   Calcium 9.9 8.9 - 10.3 mg/dL   Total Protein 8.3 (H) 6.5 - 8.1 g/dL   Albumin 4.4 3.5 - 5.0 g/dL   AST 24 15 - 41  U/L   ALT 17 14 - 54 U/L   Alkaline Phosphatase 105 38 - 126 U/L   Total Bilirubin 1.0 0.3 - 1.2 mg/dL   GFR calc non Af Amer 47 (L) >60 mL/min   GFR calc Af Amer 55 (L) >60 mL/min    Comment: (NOTE) The eGFR has been calculated using the CKD EPI equation. This calculation has not been validated in all clinical situations. eGFR's persistently <60 mL/min signify possible Chronic Kidney Disease.    Anion gap 13 5 - 15    Comment: Performed at [facility], [place] [address], [place], [place] [address]  CBC     Status: Abnormal   Collection Time: 04/07/18 10:13 AM  Result Value Ref Range   WBC 8.5 4.0 - 10.5 K/uL   RBC 5.14 (H) 3.87 - 5.11 MIL/uL   Hemoglobin 14.2 12.0 - 15.0 g/dL   HCT 43.5 36.0 - 46.0 %   MCV 84.6 78.0 - 100.0 fL   MCH 27.6 26.0 - 34.0 pg   MCHC 32.6 30.0 - 36.0 g/dL   RDW 16.0 (H) 11.5 - 15.5 %   Platelets 267 150 - 400 K/uL    Comment: Performed at [facility], [place] [address], [place], [place] [address]   Ct Abdomen Pelvis W Contrast  Result Date: 04/07/2018 CLINICAL DATA:  Abdominal pain with nausea and vomiting. History of ovarian carcinoma EXAM: CT ABDOMEN AND PELVIS WITH CONTRAST TECHNIQUE: Multidetector CT imaging of the  abdomen and pelvis was performed using the standard protocol following bolus administration of intravenous contrast. CONTRAST:  136m ISOVUE-300 IOPAMIDOL (ISOVUE-300) INJECTION 61% COMPARISON:  September 01, 2017 FINDINGS: Lower chest: There is mild bibasilar scarring with mild lower lobe bronchiectatic change. No airspace consolidation in the lung bases. There are foci of coronary artery calcification. Hepatobiliary: No focal liver lesions are evident. Gallbladder is absent. There remains diffuse intrahepatic biliary duct dilatation. There remains dilatation of the common hepatic duct measuring 1.9 cm with tapering distally. No biliary duct mass or calculus evident. Pancreas: There is no pancreatic mass or inflammatory focus. Spleen: No splenic lesions are evident. Adrenals/Urinary Tract: Adrenals bilaterally appear normal. There is no appreciable renal mass or hydronephrosis on either side. There is no appreciable renal or ureteral calculus on either side. Urinary bladder is midline with wall thickness within normal limits. Stomach/Bowel: There is small bowel dilatation proximally with a transition zone in the distal jejunal region consistent with a degree of small bowel obstruction. No free air or portal venous air is evident. There are sigmoid diverticula without diverticulitis. There is no colonic wall dilatation or wall thickening. Note that a loop of small bowel extends into a small ventral hernia just to the right of midline. No obstruction is seen in this area; there is no bowel compromise in the area of this small hernia. Vascular/Lymphatic: There is atherosclerotic calcification in the aorta and common iliac arteries. Aorta is tortuous, but there is no aneurysm. Major mesenteric vessels appear patent. There is no appreciable adenopathy in the abdomen or pelvis. Reproductive: Uterus is absent.  No evident pelvic mass. Other: No omental lesions are apparent on this study. Appendix not visualized. No  periappendiceal region inflammation evident. There are surgical clips in the mid retroperitoneal region. No abscess or ascites evident in the abdomen or pelvis. Musculoskeletal: There is extensive degenerative type change in the lower thoracic and lumbar regions. Moderate spinal stenosis at L4-5 is again noted, due to disc protrusion and bony hypertrophy.  No blastic or lytic bone lesions. No intramuscular lesions are evident. There is scarring in the abdominal wall anteriorly near the small right paracentral ventral hernia. IMPRESSION: 1. Small bowel obstruction with transition zone at the level of the distal jejunum. No free air. No abscess. No periappendiceal region inflammation. 2. No omental lesions evident. Uterus absent. No pelvic mass appreciable. Next 3. Gallbladder absent. Biliary duct dilatation, stable, without obstructing lesion evident. 4.  No renal or ureteral calculus.  No hydronephrosis. 5. There is aortoiliac atherosclerosis. There are foci of coronary artery calcification. 6. Small right paracentral ventral hernia which contains bowel but no bowel compromise. Aortic Atherosclerosis (ICD10-I70.0). Electronically Signed   By: Lowella Grip III M.D.   On: 04/07/2018 13:33      Assessment/Plan H/o ovarian cancer HTN GERD H/o Left internal carotid artery aneurysm stent/coiling 2013 AKI - Cr 1.10, continue IVF  SBO - multiple prior abdominal surgeries including hysterectomy 2001 for ovarian cancer, cholecystectomy, 8-13 ventral hernia repairs, ex lap LOA for SBO ~2016: all surgeries done by Dr. Lavone Neri in [place], [place] - CT scan shows small bowel obstruction with transition zone at the level of the distal jejunum with no free air or abscess; also seen is a small right paracentral ventral hernia containing bowel but with no bowel compromise - WBC 8.5, afebrile - ventral hernia seen on CT scan reduced  ID - none VTE - SCDs FEN - IVF, NPO/NGT Foley - none  Plan - Patient is being  admitted to the medicine service. Agree with NG tube placement for decompression and NPO. Will start patient on small bowel protocol.   Wellington Hampshire, [facility] Surgery 04/07/2018, 2:46 PM Pager: [phone] Consults: [phone] Mon-Fri 7:00 am-4:30 pm Sat-Sun 7:00 am-11:30 am

## 2018-04-07 NOTE — H&P (Signed)
History and Physical  Tonya Miranda JQB:[id number] DOB: 1941-04-22 DOA: 04/07/2018  Referring physician: Franchot Heidelberg, ER PA PCP: Chriss Czar, MD  Outpatient Specialists: None Patient coming from: Home & is able to ambulate without assistance  Chief Complaint: Abdominal pain  HPI: Tonya Miranda is a 77 y.o. female with medical history significant for hypertension, osteoarthritis and multiple hospitalizations in the past, outside of the health system, for small bowel obstruction who was in her usual state of health when yesterday evening she started having abdominal pain located just to the right and above her umbilical area.  This pain continued to worsen through the night and today, she started having much more severe pain.  She is able to keep a little bit down, but came into the emergency room for further evaluation.   ED Course: In the emergency room, lab work noteworthy for mild acute kidney injury.  CT scan of abdomen and pelvis noted small bowel obstruction with transition zone at the level of distal jejunum.  No free air or abscess.  Also noted to have a small right paracentral ventral hernia containing bowel but no bowel compromise.  Patient had an NG tube placed.  General surgery consulted and at this time, no immediate surgical issue needed.  Hospitalist were called for further evaluation and admission.  Review of Systems: Patient seen in the emergency room. Pt complains of some mild pain in her abdomen just to the right of her umbilicus, but feeling much better.  She also comments of some discomfort with her NG tube.  She says that she has occasional arthritic pains, but they do not seem to bothering her right now  Pt denies any headaches, vision changes, dysphagia, chest pain, palpitations, shortness of breath, wheeze, cough, hematuria, dysuria, constipation, diarrhea, focal extremity numbness weakness or pain.  Review of systems are otherwise negative   Past  Medical History:  Diagnosis Date  . 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 Chronic headaches   . Diabetes mellitus   . Diverticulosis of sigmoid colon 04/02/2008   Dr. Oretha Ellis  . Fibromyalgia   . GERD (gastroesophageal reflux disease)   . Hypertension   . Osteoarthritis   . Ovarian cancer ([place])   . PONV (postoperative nausea and vomiting)    Past Surgical History:  Procedure Laterality Date  . ABDOMINAL HYSTERECTOMY     ovarian cancer  . BRAIN SURGERY     aneurysm  . CHOLECYSTECTOMY    . COLONOSCOPY  04/02/2008   Dr. Oretha Ellis, [place].  Marland Kitchen COLONOSCOPY W/ BIOPSIES  09/09/1999   Dr. Durwin Reges Imam  . CRANIECTOMY  08/10/2012   Procedure: CRANIECTOMY FOR TIC DOULOUREUX;  Surgeon: Hosie Spangle, MD;  Location: [facility];  Service: Neurosurgery;  Laterality: Right;  Right Retromastoid Craniectomy with Microvascular Decompression, Decompression of the trigeminal nerve  . ESOPHAGOGASTRODUODENOSCOPY  01/07/2012   Dr. Oretha Ellis, [place].  . HERNIA REPAIR     left inguinal repair and adhesion repair    Social History:  reports that she has never smoked. She has never used smokeless tobacco. She reports that she drinks alcohol. She reports that she does not use drugs.  Lives at home with her husband.  Denies any tobacco alcohol or drug use.  Ambulates without assistance, but when her arthritis flares, she does have some difficulty   Allergies  Allergen Reactions  . Latex Dermatitis  . Morphine And Related     Hallucinations, headache    Family  History  Problem Relation Age of Onset  . Mesothelioma Sister   . Bone cancer Sister   . Cancer Brother        unknown  . Colon cancer Neg Hx   . Esophageal cancer Neg Hx       Prior to Admission medications   Medication Sig Start Date End Date Taking? Authorizing Provider  acetaminophen (TYLENOL) 500 MG tablet Take 500 mg by mouth every 6 (six) hours as needed for moderate pain.     [provider]  amLODipine (NORVASC) 5 MG  tablet Take 5 mg by mouth daily.  08/26/17   [provider]  cetirizine (ZYRTEC) 10 MG tablet Take 10 mg by mouth daily.    [provider]  diphenhydrAMINE (BENADRYL) 25 MG tablet Take 25 mg by mouth as needed for allergies.     [provider]  fluticasone (FLONASE) 50 MCG/ACT nasal spray Place 2 sprays into the nose daily.    [provider]  GELATIN PO Take 1 Dose by mouth daily.    [provider]  ibuprofen (ADVIL,MOTRIN) 200 MG tablet Take 200 mg by mouth every 6 (six) hours as needed for mild pain.     [provider]  Multiple Vitamin (MULTIVITAMIN WITH MINERALS) TABS Take 1 tablet by mouth daily.    [provider]  pantoprazole (PROTONIX) 40 MG tablet Take 40 mg by mouth daily as needed. Heart Burn    [provider]  TURMERIC PO Take 1 Dose by mouth daily as needed. Arthritis    [provider]    Physical Exam: BP 127/78   Pulse 85   Temp 97.9 F (36.6 C) (Oral)   Resp 16   SpO2 96%   General: Alert and oriented x3, no acute distress, fatigued Eyes: Sclera nonicteric, extraocular movements are intact ENT: Normocephalic and atraumatic, NG tube in place, mucous members slightly dry Neck: Supple, no JVD Cardiovascular: Regular rate and rhythm, S1-S2 Respiratory: Clear to auscultation bilaterally Abdomen: Soft, mild distention, some tenderness on the right side, scant bowel sounds Skin: No skin breaks, tears or lesions Musculoskeletal: No clubbing or cyanosis or edema Psychiatric: Appropriate, no evidence of psychoses Neurologic: No focal deficits          Labs on Admission:  Basic Metabolic Panel: Recent Labs  Lab 04/07/18 1013  NA 141  K 4.0  CL 104  CO2 24  GLUCOSE 132*  BUN 28*  CREATININE 1.10*  CALCIUM 9.9   Liver Function Tests: Recent Labs  Lab 04/07/18 1013  AST 24  ALT 17  ALKPHOS 105  BILITOT 1.0  PROT 8.3*  ALBUMIN 4.4   Recent Labs  Lab 04/07/18 1013    LIPASE 25   No results for input(s): AMMONIA in the last 168 hours. CBC: Recent Labs  Lab 04/07/18 1013  WBC 8.5  HGB 14.2  HCT 43.5  MCV 84.6  PLT 267   Cardiac Enzymes: No results for input(s): CKTOTAL, CKMB, CKMBINDEX, TROPONINI in the last 168 hours.  BNP (last 3 results) No results for input(s): BNP in the last 8760 hours.  ProBNP (last 3 results) No results for input(s): PROBNP in the last 8760 hours.  CBG: No results for input(s): GLUCAP in the last 168 hours.  Radiological Exams on Admission: Ct Abdomen Pelvis W Contrast  Result Date: 04/07/2018 CLINICAL DATA:  Abdominal pain with nausea and vomiting. History of ovarian carcinoma EXAM: CT ABDOMEN AND PELVIS WITH CONTRAST TECHNIQUE: Multidetector CT imaging  of the abdomen and pelvis was performed using the standard protocol following bolus administration of intravenous contrast. CONTRAST:  171mL ISOVUE-300 IOPAMIDOL (ISOVUE-300) INJECTION 61% COMPARISON:  September 01, 2017 FINDINGS: Lower chest: There is mild bibasilar scarring with mild lower lobe bronchiectatic change. No airspace consolidation in the lung bases. There are foci of coronary artery calcification. Hepatobiliary: No focal liver lesions are evident. Gallbladder is absent. There remains diffuse intrahepatic biliary duct dilatation. There remains dilatation of the common hepatic duct measuring 1.9 cm with tapering distally. No biliary duct mass or calculus evident. Pancreas: There is no pancreatic mass or inflammatory focus. Spleen: No splenic lesions are evident. Adrenals/Urinary Tract: Adrenals bilaterally appear normal. There is no appreciable renal mass or hydronephrosis on either side. There is no appreciable renal or ureteral calculus on either side. Urinary bladder is midline with wall thickness within normal limits. Stomach/Bowel: There is small bowel dilatation proximally with a transition zone in the distal jejunal region consistent with a degree of small  bowel obstruction. No free air or portal venous air is evident. There are sigmoid diverticula without diverticulitis. There is no colonic wall dilatation or wall thickening. Note that a loop of small bowel extends into a small ventral hernia just to the right of midline. No obstruction is seen in this area; there is no bowel compromise in the area of this small hernia. Vascular/Lymphatic: There is atherosclerotic calcification in the aorta and common iliac arteries. Aorta is tortuous, but there is no aneurysm. Major mesenteric vessels appear patent. There is no appreciable adenopathy in the abdomen or pelvis. Reproductive: Uterus is absent.  No evident pelvic mass. Other: No omental lesions are apparent on this study. Appendix not visualized. No periappendiceal region inflammation evident. There are surgical clips in the mid retroperitoneal region. No abscess or ascites evident in the abdomen or pelvis. Musculoskeletal: There is extensive degenerative type change in the lower thoracic and lumbar regions. Moderate spinal stenosis at L4-5 is again noted, due to disc protrusion and bony hypertrophy. No blastic or lytic bone lesions. No intramuscular lesions are evident. There is scarring in the abdominal wall anteriorly near the small right paracentral ventral hernia. IMPRESSION: 1. Small bowel obstruction with transition zone at the level of the distal jejunum. No free air. No abscess. No periappendiceal region inflammation. 2. No omental lesions evident. Uterus absent. No pelvic mass appreciable. Next 3. Gallbladder absent. Biliary duct dilatation, stable, without obstructing lesion evident. 4.  No renal or ureteral calculus.  No hydronephrosis. 5. There is aortoiliac atherosclerosis. There are foci of coronary artery calcification. 6. Small right paracentral ventral hernia which contains bowel but no bowel compromise. Aortic Atherosclerosis (ICD10-I70.0). Electronically Signed   By: Lowella Grip III M.D.   On:  04/07/2018 13:33    EKG: Not done  Assessment/Plan Present on Admission: . SBO (small bowel obstruction) ([place]): Follow-up abdominal x-ray in the morning.  In the meantime, NG tube to continuous suction, medication for pain and nausea.  Encourage ambulation.  General surgery will follow.  . Hypertension: Monitor input and output.  PRN IV hydralazine while n.p.o.  . GERD (gastroesophageal reflux disease) IV PPI  ?  History of diabetes: Seen in the problem list.  CBG at 132 on admission.  Will check A1c.  No sliding scale unless A1c confirms diabetes  Arthritis: Holding anti-inflammatories  Acute kidney injury: Secondary to dehydration.  Gently being hydrated  Principal Problem:   SBO (small bowel obstruction) (HCC) Active Problems:   Hypertension   GERD (  gastroesophageal reflux disease)   DVT prophylaxis: SCDs  Code Status: Full code is confirmed by patient  Family Communication: Husband at the bedside  Disposition Plan: Home once small bowel obstruction resolved  Consults called: General surgery: Annye English  Admission status: Given need for acute hospital services and expected to be her past 2 midnights, admitted as inpatient.    Annita Brod MD [facility] Pager [phone]  If 7PM-7AM, please contact night-coverage [email or web address] Password [facility]  04/07/2018, 3:42 PM

## 2018-04-07 NOTE — ED Provider Notes (Signed)
[facility] DEPT Provider Note   CSN: [id number] Arrival date & time: 04/07/18  0845     History   Chief Complaint Chief Complaint  Patient presents with  . Abdominal Pain    HPI Tonya Miranda is a 77 y.o. female presenting for evaluation of abdominal pain.  Patient states that yesterday evening, she had acute onset right lower quadrant abdominal pain.  It is sharp and constant.  Nothing makes it better or worse.  She denies associated fevers, chills, nausea, or vomiting.  She reports she is having regular bowel movements, although has to take medication to have a normal bowel movement.  She reports no urinary symptoms, this does not change her pain.  She has not attempted p.o. today.  She has significant history of abdominal problems including diverticulosis, hernias, and ovarian cancer.  She has had multiple abdominal surgeries and was told she has adhesions.  Pain does not radiate. She denies sick contacts.   HPI  Past Medical History:  Diagnosis Date  . 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 Chronic headaches   . Diabetes mellitus   . Diverticulosis of sigmoid colon 04/02/2008   Dr. Oretha Ellis  . Fibromyalgia   . GERD (gastroesophageal reflux disease)   . Hypertension   . Osteoarthritis   . Ovarian cancer ([place])   . PONV (postoperative nausea and vomiting)     Patient Active Problem List   Diagnosis Date Noted  . SBO (small bowel obstruction) ([place]) 04/07/2018  . Cyclic vomiting syndrome 10/09/2013  . 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 Serum calcium elevated 10/09/2013    Past Surgical History:  Procedure Laterality Date  . ABDOMINAL HYSTERECTOMY     ovarian cancer  . BRAIN SURGERY     aneurysm  . CHOLECYSTECTOMY    . COLONOSCOPY  04/02/2008   Dr. Oretha Ellis, [place].  Marland Kitchen COLONOSCOPY W/ BIOPSIES  09/09/1999   Dr. Durwin Reges Imam  . CRANIECTOMY  08/10/2012   Procedure: CRANIECTOMY FOR TIC DOULOUREUX;  Surgeon: Hosie Spangle, MD;  Location: [facility];  Service: Neurosurgery;  Laterality: Right;  Right Retromastoid Craniectomy with Microvascular Decompression, Decompression of the trigeminal nerve  . ESOPHAGOGASTRODUODENOSCOPY  01/07/2012   Dr. Oretha Ellis, [place].  . HERNIA REPAIR     left inguinal repair and adhesion repair     OB History   None      Home Medications    Prior to Admission medications   Medication Sig Start Date End Date Taking? Authorizing Provider  acetaminophen (TYLENOL) 500 MG tablet Take 500 mg by mouth every 6 (six) hours as needed for moderate pain.     [provider]  amLODipine (NORVASC) 5 MG tablet Take 5 mg by mouth daily.  08/26/17   [provider]  cetirizine (ZYRTEC) 10 MG tablet Take 10 mg by mouth daily.    [provider]  diphenhydrAMINE (BENADRYL) 25 MG tablet Take 25 mg by mouth as needed for allergies.     [provider]  fluticasone (FLONASE) 50 MCG/ACT nasal spray Place 2 sprays into the nose daily.    [provider]  GELATIN PO Take 1 Dose by mouth daily.    [provider]  ibuprofen (ADVIL,MOTRIN) 200 MG tablet Take 200 mg by mouth every 6 (six) hours as needed for mild pain.     [provider]  Multiple Vitamin (MULTIVITAMIN WITH MINERALS) TABS Take 1 tablet by mouth daily.  [provider]  pantoprazole (PROTONIX) 40 MG tablet Take 40 mg by mouth daily as needed. Heart Burn    [provider]  TURMERIC PO Take 1 Dose by mouth daily as needed. Arthritis    [provider]    Family History Family History  Problem Relation Age of Onset  . Mesothelioma Sister   . Bone cancer Sister   . Cancer Brother        unknown  . Colon cancer Neg Hx   . Esophageal cancer Neg Hx     Social History Social History   Tobacco Use  . Smoking status: Never Smoker  . Smokeless tobacco: Never Used  Substance Use Topics  . Alcohol use: Yes    Comment: wine rarely  . Drug use: No      Allergies   Latex and Morphine and related   Review of Systems Review of Systems  Gastrointestinal: Positive for abdominal pain.  All other systems reviewed and are negative.    Physical Exam Updated Vital Signs BP 127/78   Pulse 85   Temp 97.9 F (36.6 C) (Oral)   Resp 16   SpO2 96%   Physical Exam  Constitutional: She is oriented to person, place, and time. She appears well-developed and well-nourished.  Pt appears very uncomfortable due to pain.   HENT:  Head: Normocephalic and atraumatic.  Eyes: Pupils are equal, round, and reactive to light. Conjunctivae and EOM are normal.  Neck: Normal range of motion. Neck supple.  Cardiovascular: Normal rate, regular rhythm and intact distal pulses.  Pulmonary/Chest: Effort normal and breath sounds normal. No respiratory distress. She has no wheezes.  Abdominal: Soft. Bowel sounds are normal. She exhibits no distension and no mass. There is tenderness. There is guarding. There is no rebound.  TTP of R side abd with guarding. No rigidity or distention. multiple surgical scars noted. Bowel sounds normoactive  Musculoskeletal: Normal range of motion.  Neurological: She is alert and oriented to person, place, and time.  Skin: Skin is warm and dry.  Psychiatric: She has a normal mood and affect.  Nursing note and vitals reviewed.    ED Treatments / Results  Labs (all labs ordered are listed, but only abnormal results are displayed) Labs Reviewed  COMPREHENSIVE METABOLIC PANEL - Abnormal; Notable for the following components:      Result Value   Glucose, Bld 132 (*)    BUN 28 (*)    Creatinine, Ser 1.10 (*)    Total Protein 8.3 (*)    GFR calc non Af Amer 47 (*)    GFR calc Af Amer 55 (*)    All other components within normal limits  CBC - Abnormal; Notable for the following components:   RBC 5.14 (*)    RDW 16.0 (*)    All other components within normal limits  LIPASE, BLOOD  URINALYSIS, ROUTINE W REFLEX  MICROSCOPIC    EKG None  Radiology Ct Abdomen Pelvis W Contrast  Result Date: 04/07/2018 CLINICAL DATA:  Abdominal pain with nausea and vomiting. History of ovarian carcinoma EXAM: CT ABDOMEN AND PELVIS WITH CONTRAST TECHNIQUE: Multidetector CT imaging of the abdomen and pelvis was performed using the standard protocol following bolus administration of intravenous contrast. CONTRAST:  171mL ISOVUE-300 IOPAMIDOL (ISOVUE-300) INJECTION 61% COMPARISON:  September 01, 2017 FINDINGS: Lower chest: There is mild bibasilar scarring with mild lower lobe bronchiectatic change. No airspace consolidation in the lung bases. There are foci of coronary artery calcification. Hepatobiliary: No  focal liver lesions are evident. Gallbladder is absent. There remains diffuse intrahepatic biliary duct dilatation. There remains dilatation of the common hepatic duct measuring 1.9 cm with tapering distally. No biliary duct mass or calculus evident. Pancreas: There is no pancreatic mass or inflammatory focus. Spleen: No splenic lesions are evident. Adrenals/Urinary Tract: Adrenals bilaterally appear normal. There is no appreciable renal mass or hydronephrosis on either side. There is no appreciable renal or ureteral calculus on either side. Urinary bladder is midline with wall thickness within normal limits. Stomach/Bowel: There is small bowel dilatation proximally with a transition zone in the distal jejunal region consistent with a degree of small bowel obstruction. No free air or portal venous air is evident. There are sigmoid diverticula without diverticulitis. There is no colonic wall dilatation or wall thickening. Note that a loop of small bowel extends into a small ventral hernia just to the right of midline. No obstruction is seen in this area; there is no bowel compromise in the area of this small hernia. Vascular/Lymphatic: There is atherosclerotic calcification in the aorta and common iliac arteries. Aorta is tortuous, but  there is no aneurysm. Major mesenteric vessels appear patent. There is no appreciable adenopathy in the abdomen or pelvis. Reproductive: Uterus is absent.  No evident pelvic mass. Other: No omental lesions are apparent on this study. Appendix not visualized. No periappendiceal region inflammation evident. There are surgical clips in the mid retroperitoneal region. No abscess or ascites evident in the abdomen or pelvis. Musculoskeletal: There is extensive degenerative type change in the lower thoracic and lumbar regions. Moderate spinal stenosis at L4-5 is again noted, due to disc protrusion and bony hypertrophy. No blastic or lytic bone lesions. No intramuscular lesions are evident. There is scarring in the abdominal wall anteriorly near the small right paracentral ventral hernia. IMPRESSION: 1. Small bowel obstruction with transition zone at the level of the distal jejunum. No free air. No abscess. No periappendiceal region inflammation. 2. No omental lesions evident. Uterus absent. No pelvic mass appreciable. Next 3. Gallbladder absent. Biliary duct dilatation, stable, without obstructing lesion evident. 4.  No renal or ureteral calculus.  No hydronephrosis. 5. There is aortoiliac atherosclerosis. There are foci of coronary artery calcification. 6. Small right paracentral ventral hernia which contains bowel but no bowel compromise. Aortic Atherosclerosis (ICD10-I70.0). Electronically Signed   By: Lowella Grip III M.D.   On: 04/07/2018 13:33    Procedures Procedures (including critical care time)  Medications Ordered in ED Medications  LORazepam (ATIVAN) injection 1 mg (has no administration in time range)  LORazepam (ATIVAN) injection 1 mg (has no administration in time range)  fentaNYL (SUBLIMAZE) injection 50 mcg (50 mcg Intravenous Given 04/07/18 1013)  iohexol (OMNIPAQUE) 300 MG/ML solution 30 mL (30 mLs Oral Contrast Given 04/07/18 0939)  ondansetron (ZOFRAN) injection 4 mg (4 mg Intravenous  Given 04/07/18 1033)  promethazine (PHENERGAN) injection 12.5 mg (12.5 mg Intravenous Given 04/07/18 1240)  iopamidol (ISOVUE-300) 61 % injection 100 mL (100 mLs Intravenous Contrast Given 04/07/18 1253)  fentaNYL (SUBLIMAZE) injection 50 mcg (50 mcg Intravenous Given 04/07/18 1240)  lidocaine (XYLOCAINE) 2 % jelly 1 application (1 application Topical Given 04/07/18 1452)     Initial Impression / Assessment and Plan / ED Course  I have reviewed the triage vital signs and the nursing notes.  Pertinent labs & imaging results that were available during my care of the patient were reviewed by me and considered in my medical decision making (see chart for details).  Patient presenting for evaluation of right lower quadrant pain.  Physical exam shows elderly female who appears very uncomfortable due to pain.  Significant abdominal history with multiple abdominal surgeries.  She has at high risk for small bowel obstruction.  Additionally, concerned about possible appendicitis, kidney stone, or intestinal infection.  Obtain labs, urine, and CT abdomen.  Zofran and fentanyl for symptom control.  Patient began vomiting while drinking the oral contrast.  Phenergan ordered for nausea.  Repeat fentanyl dose.  Labs without leukocytosis.  Hemoglobin stable.  Creatinine mildly elevated, kidney function slightly worsened.  Lipase reassuring.  Urine pending.  CT pending.  CT shows small bowel obstruction and right paracentral ventral hernia with bowel involvement without bowel complication.  On reassessment, patient reports her symptoms are improved.  Will put in NG tube.   Discussed with hospitalist service, patient to be admitted.  Discussed with general surgery, who will consult and follow the patient while she is in the hospital.   Final Clinical Impressions(s) / ED Diagnoses   Final diagnoses:  SBO (small bowel obstruction) ([place])  Ventral hernia with obstruction and without gangrene    ED Discharge  Orders    None       Franchot Heidelberg, PA-C 04/07/18 1528    Varney Biles, MD 04/08/18 [id number]
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
## 2018-04-07 NOTE — ED Notes (Signed)
Patient transported to CT 

## 2018-04-08 ENCOUNTER — Inpatient Hospital Stay (HOSPITAL_COMMUNITY): Payer: Medicare Other

## 2018-04-08 LAB — URINALYSIS, ROUTINE W REFLEX MICROSCOPIC
BACTERIA UA: NONE SEEN
Bilirubin Urine: NEGATIVE
Glucose, UA: NEGATIVE mg/dL
HGB URINE DIPSTICK: NEGATIVE
Ketones, ur: 5 mg/dL — AB
NITRITE: NEGATIVE
PROTEIN: NEGATIVE mg/dL
Specific Gravity, Urine: 1.021 (ref 1.005–1.030)
pH: 6 (ref 5.0–8.0)

## 2018-04-08 LAB — BASIC METABOLIC PANEL
Anion gap: 8 (ref 5–15)
BUN: 23 mg/dL — ABNORMAL HIGH (ref 6–20)
CHLORIDE: 111 mmol/L (ref 101–111)
CO2: 22 mmol/L (ref 22–32)
CREATININE: 0.92 mg/dL (ref 0.44–1.00)
Calcium: 8.2 mg/dL — ABNORMAL LOW (ref 8.9–10.3)
GFR calc non Af Amer: 59 mL/min — ABNORMAL LOW (ref >60)
Glucose, Bld: 94 mg/dL (ref 65–99)
POTASSIUM: 3.9 mmol/L (ref 3.5–5.1)
Sodium: 141 mmol/L (ref 135–145)

## 2018-04-08 LAB — CBC
HEMATOCRIT: 33.7 % — AB (ref 36.0–46.0)
Hemoglobin: 10.4 g/dL — ABNORMAL LOW (ref 12.0–15.0)
MCH: 26.8 pg (ref 26.0–34.0)
MCHC: 30.9 g/dL (ref 30.0–36.0)
MCV: 86.9 fL (ref 78.0–100.0)
PLATELETS: 205 10*3/uL (ref 150–400)
RBC: 3.88 MIL/uL (ref 3.87–5.11)
RDW: 16.5 % — ABNORMAL HIGH (ref 11.5–15.5)
WBC: 5.1 10*3/uL (ref 4.0–10.5)

## 2018-04-08 NOTE — Progress Notes (Signed)
Patient ID: Tonya Miranda, female   DOB: 05/01/1941, 77 y.o.   MRN: [id number]                                                                PROGRESS NOTE                                                                                                                                                                                                             Patient Demographics:    Tonya Miranda, is a 77 y.o. female, DOB - October 10, 1941, VQQ:[id number]  Admit date - 04/07/2018   Admitting Physician Annita Brod, MD  Outpatient Primary MD for the patient is Chriss Czar, MD  LOS - 1  Outpatient Specialists:     Chief Complaint  Patient presents with  . Abdominal Pain       Brief Narrative    77 y.o. female with medical history significant for hypertension, osteoarthritis and multiple hospitalizations in the past, outside of the health system, for small bowel obstruction who was in her usual state of health when yesterday evening she started having abdominal pain located just to the right and above her umbilical area.  This pain continued to worsen through the night and today, she started having much more severe pain.  She is able to keep a little bit down, but came into the emergency room for further evaluation.   ED Course: In the emergency room, lab work noteworthy for mild acute kidney injury.  CT scan of abdomen and pelvis noted small bowel obstruction with transition zone at the level of distal jejunum.  No free air or abscess.  Also noted to have a small right paracentral ventral hernia containing bowel but no bowel compromise.  Patient had an NG tube placed.  General surgery consulted and at this time, no immediate surgical issue needed.  Hospitalist were called for further evaluation and admission.  Review of Systems: Patient seen in the emergency room. Pt complains of some mild pain in her abdomen just to the right of her umbilicus, but feeling much better.  She also  comments of some discomfort with her NG tube.  She says that she has occasional arthritic pains, but they do not seem to bothering her right now  Pt denies any headaches, vision  changes, dysphagia, chest pain, palpitations, shortness of breath, wheeze, cough, hematuria, dysuria, constipation, diarrhea, focal extremity numbness weakness or pain.  Review of systems are otherwise negative      Subjective:    Tonya Miranda today states that RLQ pain is gone.  Pt denies fever, chills, n/v, diarrhea, brbpr, black stool.   No headache, No chest pain, No new tingling or numbness, No Cough - SOB.    Assessment  & Plan :    Principal Problem:   SBO (small bowel obstruction) (HCC) Active Problems:   Hypertension   GERD (gastroesophageal reflux disease)   Present on Admission: . SBO (small bowel obstruction) ([facility]): NGT to low intermittent suction General surgery consulted , appreciate input  . Hypertension:  PRN IV hydralazine while n.p.o.  . GERD (gastroesophageal reflux disease) IV Pepcid  ?  History of diabetes: Seen in the problem list.  CBG at 132 on admission. Hga1c=6.2  Arthritis: Holding anti-inflammatories  Acute kidney injury: Secondary to dehydration.  Gently being hydrated  Principal Problem:   SBO (small bowel obstruction) (HCC) Active Problems:   Hypertension   GERD (gastroesophageal reflux disease)   DVT prophylaxis: SCDs  Code Status: Full code is confirmed by patient  Family Communication: Husband at the bedside  Disposition Plan: Home once small bowel obstruction resolved  Consults called: General surgery: Annye English  Admission status: Given need for acute hospital services and expected to be her past 2 midnights, admitted as inpatient.       Lab Results  Component Value Date   PLT 205 04/08/2018    Antibiotics  :  none  Anti-infectives (From admission, onward)   None        Objective:   Vitals:   04/07/18 1720  04/07/18 1821 04/07/18 1948 04/08/18 0526  BP: 140/68  (!) 116/53 124/68  Pulse: 81  80 78  Resp:   15 18  Temp: 98 F (36.7 C)  98.9 F (37.2 C) 98.1 F (36.7 C)  TempSrc: Oral  Oral Oral  SpO2: 96%  94% 92%  Weight:  95.3 kg (210 lb 1.6 oz)    Height:  5' (1.524 m)      Wt Readings from Last 3 Encounters:  04/07/18 95.3 kg (210 lb 1.6 oz)  09/01/17 90.7 kg (200 lb)  08/11/17 93 kg (205 lb)     Intake/Output Summary (Last 24 hours) at 04/08/2018 0619 Last data filed at 04/08/2018 0100 Gross per 24 hour  Intake 275 ml  Output 1450 ml  Net -1175 ml     Physical Exam  Awake Alert, Oriented X 3, No new F.N deficits, Normal affect [place].AT,PERRAL Supple Neck,No JVD, No cervical lymphadenopathy appriciated.  Symmetrical Chest wall movement, Good air movement bilaterally, CTAB RRR,No Gallops,Rubs or new Murmurs, No Parasternal Heave +ve B.Sounds, Abd Soft, No tenderness, No organomegaly appriciated, No rebound - guarding or rigidity. No Cyanosis, Clubbing or edema, No new Rash or bruise   NGT in place    Data Review:    CBC Recent Labs  Lab 04/07/18 1013 04/08/18 0455  WBC 8.5 5.1  HGB 14.2 10.4*  HCT 43.5 33.7*  PLT 267 205  MCV 84.6 86.9  MCH 27.6 26.8  MCHC 32.6 30.9  RDW 16.0* 16.5*    Chemistries  Recent Labs  Lab 04/07/18 1013 04/08/18 0455  NA 141 141  K 4.0 3.9  CL 104 111  CO2 24 22  GLUCOSE 132* 94  BUN 28* 23*  CREATININE 1.10* 0.92  CALCIUM 9.9 8.2*  AST 24  --   ALT 17  --   ALKPHOS 105  --   BILITOT 1.0  --    ------------------------------------------------------------------------------------------------------------------ No results for input(s): CHOL, HDL, LDLCALC, TRIG, CHOLHDL, LDLDIRECT in the last 72 hours.  Lab Results  Component Value Date   HGBA1C 6.2 (H) 04/07/2018   ------------------------------------------------------------------------------------------------------------------ No results for input(s): TSH, T4TOTAL,  T3FREE, THYROIDAB in the last 72 hours.  Invalid input(s): FREET3 ------------------------------------------------------------------------------------------------------------------ No results for input(s): VITAMINB12, FOLATE, FERRITIN, TIBC, IRON, RETICCTPCT in the last 72 hours.  Coagulation profile No results for input(s): INR, PROTIME in the last 168 hours.  No results for input(s): DDIMER in the last 72 hours.  Cardiac Enzymes No results for input(s): CKMB, TROPONINI, MYOGLOBIN in the last 168 hours.  Invalid input(s): CK ------------------------------------------------------------------------------------------------------------------ No results found for: BNP  Inpatient Medications  Scheduled Meds: Continuous Infusions: . sodium chloride 50 mL/hr at 04/07/18 1730  . famotidine (PEPCID) IV Stopped (04/07/18 2254)   PRN Meds:.fentaNYL (SUBLIMAZE) injection, hydrALAZINE, LORazepam, ondansetron **OR** ondansetron (ZOFRAN) IV, promethazine  Micro Results No results found for this or any previous visit (from the past 240 hour(s)).  Radiology Reports Ct Abdomen Pelvis W Contrast  Result Date: 04/07/2018 CLINICAL DATA:  Abdominal pain with nausea and vomiting. History of ovarian carcinoma EXAM: CT ABDOMEN AND PELVIS WITH CONTRAST TECHNIQUE: Multidetector CT imaging of the abdomen and pelvis was performed using the standard protocol following bolus administration of intravenous contrast. CONTRAST:  152mL ISOVUE-300 IOPAMIDOL (ISOVUE-300) INJECTION 61% COMPARISON:  September 01, 2017 FINDINGS: Lower chest: There is mild bibasilar scarring with mild lower lobe bronchiectatic change. No airspace consolidation in the lung bases. There are foci of coronary artery calcification. Hepatobiliary: No focal liver lesions are evident. Gallbladder is absent. There remains diffuse intrahepatic biliary duct dilatation. There remains dilatation of the common hepatic duct measuring 1.9 cm with tapering  distally. No biliary duct mass or calculus evident. Pancreas: There is no pancreatic mass or inflammatory focus. Spleen: No splenic lesions are evident. Adrenals/Urinary Tract: Adrenals bilaterally appear normal. There is no appreciable renal mass or hydronephrosis on either side. There is no appreciable renal or ureteral calculus on either side. Urinary bladder is midline with wall thickness within normal limits. Stomach/Bowel: There is small bowel dilatation proximally with a transition zone in the distal jejunal region consistent with a degree of small bowel obstruction. No free air or portal venous air is evident. There are sigmoid diverticula without diverticulitis. There is no colonic wall dilatation or wall thickening. Note that a loop of small bowel extends into a small ventral hernia just to the right of midline. No obstruction is seen in this area; there is no bowel compromise in the area of this small hernia. Vascular/Lymphatic: There is atherosclerotic calcification in the aorta and common iliac arteries. Aorta is tortuous, but there is no aneurysm. Major mesenteric vessels appear patent. There is no appreciable adenopathy in the abdomen or pelvis. Reproductive: Uterus is absent.  No evident pelvic mass. Other: No omental lesions are apparent on this study. Appendix not visualized. No periappendiceal region inflammation evident. There are surgical clips in the mid retroperitoneal region. No abscess or ascites evident in the abdomen or pelvis. Musculoskeletal: There is extensive degenerative type change in the lower thoracic and lumbar regions. Moderate spinal stenosis at L4-5 is again noted, due to disc protrusion and bony hypertrophy. No blastic or lytic bone lesions. No intramuscular lesions are evident. There is scarring in the abdominal wall anteriorly  near the small right paracentral ventral hernia. IMPRESSION: 1. Small bowel obstruction with transition zone at the level of the distal jejunum. No  free air. No abscess. No periappendiceal region inflammation. 2. No omental lesions evident. Uterus absent. No pelvic mass appreciable. Next 3. Gallbladder absent. Biliary duct dilatation, stable, without obstructing lesion evident. 4.  No renal or ureteral calculus.  No hydronephrosis. 5. There is aortoiliac atherosclerosis. There are foci of coronary artery calcification. 6. Small right paracentral ventral hernia which contains bowel but no bowel compromise. Aortic Atherosclerosis (ICD10-I70.0). Electronically Signed   By: Lowella Grip III M.D.   On: 04/07/2018 13:33   Dg Abd Portable 1v-small Bowel Obstruction Protocol-initial, 8 Hr Delay  Result Date: 04/08/2018 CLINICAL DATA:  77 year old female with small bowel series, 8 hour postcontrast image. EXAM: PORTABLE ABDOMEN - 1 VIEW COMPARISON:  Earlier radiograph dated 04/07/2018 and CT of the abdomen pelvis dated 04/07/2018 FINDINGS: Partially visualized enteric tube in the upper abdomen likely in the stomach. There is passage of contrast into the colon with contrast seen in the rectosigmoid. IMPRESSION: Oral contrast has passed into the large bowel. Electronically Signed   By: Anner Crete M.D.   On: 04/08/2018 02:52   Dg Abd Portable 1v-small Bowel Protocol-position Verification  Result Date: 04/07/2018 CLINICAL DATA:  Evaluate NG tube position EXAM: PORTABLE ABDOMEN - 1 VIEW COMPARISON:  CT abdomen pelvis of 04/07/2018 FINDINGS: The NG tube coils within the fundus of the stomach with the tip remaining within the fundus of the stomach. The bowel gas pattern is nonspecific. Contrast is being excreted by the kidneys with some opacification of the urinary bladder. Degenerative changes are noted throughout the lumbar spine. IMRESSION: NG tube coils in the fundus with the tip remaining in the fundus of the stomach. Electronically Signed   By: Ivar Drape M.D.   On: 04/07/2018 16:59    Time Spent in minutes  30   Jani Gravel M.D on 04/08/2018 at 6:19  AM  Between 7am to 7pm - Pager - [phone]   After 7pm go to [email or web address] - password [email or web address]  [facility] -  Office  [phone]

## 2018-04-08 NOTE — Progress Notes (Signed)
Patients  Stated that she cannot movement bowels without milk of magnesium and metameusil.

## 2018-04-08 NOTE — Discharge Instructions (Signed)
Low-Fiber Diet °Fiber is found in fruits, vegetables, and whole grains. A low-fiber diet restricts fibrous foods that are not digested in the small intestine. A diet containing about 10-15 grams of fiber per day is considered low fiber. Low-fiber diets may be used to: °· Promote healing and rest the bowel during intestinal flare-ups. °· Prevent blockage of a partially obstructed or narrowed gastrointestinal tract. °· Reduce fecal weight and volume. °· Slow the movement of feces. ° °You may be on a low-fiber diet as a transitional diet following surgery, after an injury (trauma), or because of a short (acute) or lifelong (chronic) illness. Your health care provider will determine the length of time you need to stay on this diet. °What do I need to know about a low-fiber diet? °Always check the fiber content on the packaging's Nutrition Facts label, especially on foods from the grains list. Ask your dietitian if you have questions about specific foods that are related to your condition, especially if the food is not listed below. In general, a low-fiber food will have less than 2 g of fiber. °What foods can I eat? °Grains °All breads and crackers made with white flour. Sweet rolls, doughnuts, waffles, pancakes, French toast, bagels. Pretzels, Melba toast, zwieback. Well-cooked cereals, such as cornmeal, farina, or cream cereals. Dry cereals that do not contain whole grains, fruit, or nuts, such as refined corn, wheat, rice, and oat cereals. Potatoes prepared any way without skins, plain pastas and noodles, refined white rice. Use white flour for baking and making sauces. Use allowed list of grains for casseroles, dumplings, and puddings. °Vegetables °Strained tomato and vegetable juices. Fresh lettuce, cucumber, spinach. Well-cooked (no skin or pulp) or canned vegetables, such as asparagus, bean sprouts, beets, carrots, green beans, mushrooms, potatoes, pumpkin, spinach, yellow squash, tomato sauce/puree, turnips,  yams, and zucchini. Keep servings limited to ½ cup. °Fruits °All fruit juices except prune juice. Cooked or canned fruits without skin and seeds, such as applesauce, apricots, cherries, fruit cocktail, grapefruit, grapes, mandarin oranges, melons, peaches, pears, pineapple, and plums. Fresh fruits without skin, such as apricots, avocados, bananas, melons, pineapple, nectarines, and peaches. Keep servings limited to ½ cup or 1 piece. °Meat and Other Protein Sources °Ground or well-cooked tender beef, ham, veal, lamb, pork, or poultry. Eggs, plain cheese. Fish, oysters, shrimp, lobster, and other seafood. Liver, organ meats. Smooth nut butters. °Dairy °All milk products and alternative dairy substitutes, such as soy, rice, almond, and coconut, not containing added whole nuts, seeds, or added fruit. °Beverages °Decaf coffee, fruit, and vegetable juices or smoothies (small amounts, with no pulp or skins, and with fruits from allowed list), sports drinks, herbal tea. °Condiments °Ketchup, mustard, vinegar, cream sauce, cheese sauce, cocoa powder. Spices in moderation, such as allspice, basil, bay leaves, celery powder or leaves, cinnamon, cumin powder, curry powder, ginger, mace, marjoram, onion or garlic powder, oregano, paprika, parsley flakes, ground pepper, rosemary, sage, savory, tarragon, thyme, and turmeric. °Sweets and Desserts °Plain cakes and cookies, pie made with allowed fruit, pudding, custard, cream pie. Gelatin, fruit, ice, sherbet, frozen ice pops. Ice cream, ice milk without nuts. Plain hard candy, honey, jelly, molasses, syrup, sugar, chocolate syrup, gumdrops, marshmallows. Limit overall sugar intake. °Fats and Oil °Margarine, butter, cream, mayonnaise, salad oils, plain salad dressings made from allowed foods. Choose healthy fats such as olive oil, canola oil, and omega-3 fatty acids (such as found in salmon or tuna) when possible. °Other °Bouillon, broth, or cream soups made from allowed foods. Any    strained soup. Casseroles or mixed dishes made with allowed foods. °The items listed above may not be a complete list of recommended foods or beverages. Contact your dietitian for more options. °What foods are not recommended? °Grains °All whole wheat and whole grain breads and crackers. Multigrains, rye, bran seeds, nuts, or coconut. Cereals containing whole grains, multigrains, bran, coconut, nuts, raisins. Cooked or dry oatmeal, steel-cut oats. Coarse wheat cereals, granola. Cereals advertised as high fiber. Potato skins. Whole grain pasta, wild or brown rice. Popcorn. Coconut flour. Bran, buckwheat, corn bread, multigrains, rye, wheat germ. °Vegetables °Fresh, cooked or canned vegetables, such as artichokes, asparagus, beet greens, broccoli, Brussels sprouts, cabbage, celery, cauliflower, corn, eggplant, kale, legumes or beans, okra, peas, and tomatoes. Avoid large servings of any vegetables, especially raw vegetables. °Fruits °Fresh fruits, such as apples with or without skin, berries, cherries, figs, grapes, grapefruit, guavas, kiwis, mangoes, oranges, papayas, pears, persimmons, pineapple, and pomegranate. Prune juice and juices with pulp, stewed or dried prunes. Dried fruits, dates, raisins. Fruit seeds or skins. Avoid large servings of all fresh fruits. °Meats and Other Protein Sources °Tough, fibrous meats with gristle. Chunky nut butter. Cheese made with seeds, nuts, or other foods not recommended. Nuts, seeds, legumes (beans, including baked beans), dried peas, beans, lentils. °Dairy °Yogurt or cheese that contains nuts, seeds, or added fruit. °Beverages °Fruit juices with high pulp, prune juice. Caffeinated coffee and teas. °Condiments °Coconut, maple syrup, pickles, olives. °Sweets and Desserts °Desserts, cookies, or candies that contain nuts or coconut, chunky peanut butter, dried fruits. Jams, preserves with seeds, marmalade. Large amounts of sugar and sweets. Any other dessert made with fruits from  the not recommended list. °Other °Soups made from vegetables that are not recommended or that contain other foods not recommended. °The items listed above may not be a complete list of foods and beverages to avoid. Contact your dietitian for more information. °This information is not intended to replace advice given to you by your health care provider. Make sure you discuss any questions you have with your health care provider. °Document Released: 05/15/2002 Document Revised: 04/30/2016 Document Reviewed: 10/16/2013 °[employer] Interactive Patient Education © 2017 [employer] ° °

## 2018-04-08 NOTE — Progress Notes (Signed)
[facility] Progress Note     Subjective: CC:  Abdominal pain resolved - states yesterday the pain was "where it always is when this happens" and pointed to her right mid-abdomen. Denies N/V around NGT. Denies flatus or BM. Has not bed out of bed. States that she ambulates independently at baseline.  Objective: Vital signs in last 24 hours: Temp:  [97.9 F (36.6 C)-98.9 F (37.2 C)] 98.1 F (36.7 C) (05/03 0526) Pulse Rate:  [71-92] 78 (05/03 0526) Resp:  [15-20] 18 (05/03 0526) BP: (108-143)/(46-78) 124/68 (05/03 0526) SpO2:  [92 %-99 %] 92 % (05/03 0526) Weight:  [95.3 kg (210 lb 1.6 oz)] 95.3 kg (210 lb 1.6 oz) (05/02 1821) Last BM Date: 04/07/18  Intake/Output from previous day: 05/02 0701 - 05/03 0700 In: 275 [I.V.:225; IV Piggyback:50] Out: 7322 [Urine:600; Emesis/NG output:850] Intake/Output this shift: No intake/output data recorded.  PE: Gen:  Alert, NAD, pleasant and cooperative  Card:  Regular rate and rhythm, pedal pulses 2+ BL Pulm:  Normal effort, clear to auscultation bilaterally Abd: Soft, non-distended, mild TTP LUQ otherwise nontender, +BS  Skin: warm and dry, no rashes  Psych: A&Ox3   Lab Results:  Recent Labs    04/07/18 1013 04/08/18 0455  WBC 8.5 5.1  HGB 14.2 10.4*  HCT 43.5 33.7*  PLT 267 205   BMET Recent Labs    04/07/18 1013 04/08/18 0455  NA 141 141  K 4.0 3.9  CL 104 111  CO2 24 22  GLUCOSE 132* 94  BUN 28* 23*  CREATININE 1.10* 0.92  CALCIUM 9.9 8.2*   PT/INR No results for input(s): LABPROT, INR in the last 72 hours. CMP     Component Value Date/Time   NA 141 04/08/2018 0455   K 3.9 04/08/2018 0455   CL 111 04/08/2018 0455   CO2 22 04/08/2018 0455   GLUCOSE 94 04/08/2018 0455   BUN 23 (H) 04/08/2018 0455   CREATININE 0.92 04/08/2018 0455   CALCIUM 8.2 (L) 04/08/2018 0455   PROT 8.3 (H) 04/07/2018 1013   ALBUMIN 4.4 04/07/2018 1013   AST 24 04/07/2018 1013   ALT 17 04/07/2018 1013   ALKPHOS 105  04/07/2018 1013   BILITOT 1.0 04/07/2018 1013   GFRNONAA 59 (L) 04/08/2018 0455   GFRAA >60 04/08/2018 0455   Lipase     Component Value Date/Time   LIPASE 25 04/07/2018 1013       Studies/Results: Ct Abdomen Pelvis W Contrast  Result Date: 04/07/2018 CLINICAL DATA:  Abdominal pain with nausea and vomiting. History of ovarian carcinoma EXAM: CT ABDOMEN AND PELVIS WITH CONTRAST TECHNIQUE: Multidetector CT imaging of the abdomen and pelvis was performed using the standard protocol following bolus administration of intravenous contrast. CONTRAST:  139mL ISOVUE-300 IOPAMIDOL (ISOVUE-300) INJECTION 61% COMPARISON:  September 01, 2017 FINDINGS: Lower chest: There is mild bibasilar scarring with mild lower lobe bronchiectatic change. No airspace consolidation in the lung bases. There are foci of coronary artery calcification. Hepatobiliary: No focal liver lesions are evident. Gallbladder is absent. There remains diffuse intrahepatic biliary duct dilatation. There remains dilatation of the common hepatic duct measuring 1.9 cm with tapering distally. No biliary duct mass or calculus evident. Pancreas: There is no pancreatic mass or inflammatory focus. Spleen: No splenic lesions are evident. Adrenals/Urinary Tract: Adrenals bilaterally appear normal. There is no appreciable renal mass or hydronephrosis on either side. There is no appreciable renal or ureteral calculus on either side. Urinary bladder is midline with wall thickness within normal  limits. Stomach/Bowel: There is small bowel dilatation proximally with a transition zone in the distal jejunal region consistent with a degree of small bowel obstruction. No free air or portal venous air is evident. There are sigmoid diverticula without diverticulitis. There is no colonic wall dilatation or wall thickening. Note that a loop of small bowel extends into a small ventral hernia just to the right of midline. No obstruction is seen in this area; there is no  bowel compromise in the area of this small hernia. Vascular/Lymphatic: There is atherosclerotic calcification in the aorta and common iliac arteries. Aorta is tortuous, but there is no aneurysm. Major mesenteric vessels appear patent. There is no appreciable adenopathy in the abdomen or pelvis. Reproductive: Uterus is absent.  No evident pelvic mass. Other: No omental lesions are apparent on this study. Appendix not visualized. No periappendiceal region inflammation evident. There are surgical clips in the mid retroperitoneal region. No abscess or ascites evident in the abdomen or pelvis. Musculoskeletal: There is extensive degenerative type change in the lower thoracic and lumbar regions. Moderate spinal stenosis at L4-5 is again noted, due to disc protrusion and bony hypertrophy. No blastic or lytic bone lesions. No intramuscular lesions are evident. There is scarring in the abdominal wall anteriorly near the small right paracentral ventral hernia. IMPRESSION: 1. Small bowel obstruction with transition zone at the level of the distal jejunum. No free air. No abscess. No periappendiceal region inflammation. 2. No omental lesions evident. Uterus absent. No pelvic mass appreciable. Next 3. Gallbladder absent. Biliary duct dilatation, stable, without obstructing lesion evident. 4.  No renal or ureteral calculus.  No hydronephrosis. 5. There is aortoiliac atherosclerosis. There are foci of coronary artery calcification. 6. Small right paracentral ventral hernia which contains bowel but no bowel compromise. Aortic Atherosclerosis (ICD10-I70.0). Electronically Signed   By: Lowella Grip III M.D.   On: 04/07/2018 13:33   Dg Abd Portable 1v-small Bowel Obstruction Protocol-initial, 8 Hr Delay  Result Date: 04/08/2018 CLINICAL DATA:  77 year old female with small bowel series, 8 hour postcontrast image. EXAM: PORTABLE ABDOMEN - 1 VIEW COMPARISON:  Earlier radiograph dated 04/07/2018 and CT of the abdomen pelvis dated  04/07/2018 FINDINGS: Partially visualized enteric tube in the upper abdomen likely in the stomach. There is passage of contrast into the colon with contrast seen in the rectosigmoid. IMPRESSION: Oral contrast has passed into the large bowel. Electronically Signed   By: Anner Crete M.D.   On: 04/08/2018 02:52   Dg Abd Portable 1v-small Bowel Protocol-position Verification  Result Date: 04/07/2018 CLINICAL DATA:  Evaluate NG tube position EXAM: PORTABLE ABDOMEN - 1 VIEW COMPARISON:  CT abdomen pelvis of 04/07/2018 FINDINGS: The NG tube coils within the fundus of the stomach with the tip remaining within the fundus of the stomach. The bowel gas pattern is nonspecific. Contrast is being excreted by the kidneys with some opacification of the urinary bladder. Degenerative changes are noted throughout the lumbar spine. IMRESSION: NG tube coils in the fundus with the tip remaining in the fundus of the stomach. Electronically Signed   By: Ivar Drape M.D.   On: 04/07/2018 16:59    Anti-infectives: Anti-infectives (From admission, onward)   None     Assessment/Plan H/o ovarian cancer HTN GERD H/o Left internal carotid artery aneurysm stent/coiling 2013 AKI - resolved   SBO - multiple prior abdominal surgeries including hysterectomy 2001 for ovarian cancer, cholecystectomy, 8-13 ventral hernia repairs, ex lap LOA for SBO ~2016: all surgeries done by Dr. Lavone Neri in  [place], [place] - CT scan 04/07/18 w SBO with transition zone at the level of the distal jejunum with no free air or abscess; also seen is a small right paracentral ventral hernia containing bowel - ventral hernia is now reduced - having flatus and BMs (last BM was 5/2) - SB protocol 5/2 >> contrast in colon  - D/C NG tube and start clear liquids. Advance diet as tolerated to SOFT.  - stable for discharge from surgical perspective when tolerating a soft diet.  ID - none VTE - SCDs; ok for chemical VTE from surgical perspective  FEN -  IVF, clear liquids, advance to SOFT as tolerated  Foley - none   LOS: 1 day    Jill Alexanders , [facility] 04/08/2018, 8:48 AM Pager: [phone] Consults: [phone] Mon-Fri 7:00 am-4:30 pm Sat-Sun 7:00 am-11:30 am

## 2018-04-09 LAB — COMPREHENSIVE METABOLIC PANEL
ALK PHOS: 75 U/L (ref 38–126)
ALT: 13 U/L — AB (ref 14–54)
AST: 18 U/L (ref 15–41)
Albumin: 3.2 g/dL — ABNORMAL LOW (ref 3.5–5.0)
Anion gap: 9 (ref 5–15)
BILIRUBIN TOTAL: 0.8 mg/dL (ref 0.3–1.2)
BUN: 11 mg/dL (ref 6–20)
CO2: 20 mmol/L — ABNORMAL LOW (ref 22–32)
CREATININE: 0.69 mg/dL (ref 0.44–1.00)
Calcium: 8.4 mg/dL — ABNORMAL LOW (ref 8.9–10.3)
Chloride: 110 mmol/L (ref 101–111)
GFR calc Af Amer: >60 mL/min (ref >60)
Glucose, Bld: 98 mg/dL (ref 65–99)
Potassium: 4 mmol/L (ref 3.5–5.1)
Sodium: 139 mmol/L (ref 135–145)
Total Protein: 6.3 g/dL — ABNORMAL LOW (ref 6.5–8.1)

## 2018-04-09 LAB — CBC
HEMATOCRIT: 35.4 % — AB (ref 36.0–46.0)
Hemoglobin: 10.7 g/dL — ABNORMAL LOW (ref 12.0–15.0)
MCH: 26.5 pg (ref 26.0–34.0)
MCHC: 30.2 g/dL (ref 30.0–36.0)
MCV: 87.6 fL (ref 78.0–100.0)
Platelets: 192 10*3/uL (ref 150–400)
RBC: 4.04 MIL/uL (ref 3.87–5.11)
RDW: 16.3 % — ABNORMAL HIGH (ref 11.5–15.5)
WBC: 4.4 10*3/uL (ref 4.0–10.5)

## 2018-04-09 MED ORDER — TRAMADOL HCL 50 MG PO TABS: 50.0000 mg | ORAL_TABLET | Freq: Four times a day (QID) | ORAL | Status: DC | PRN

## 2018-04-09 MED ORDER — ACETAMINOPHEN 325 MG PO TABS
650.0000 mg | ORAL_TABLET | Freq: Four times a day (QID) | ORAL | Status: DC | PRN
Start: 2018-04-09 — End: 2018-04-11
  Administered 2018-04-10: 650 mg via ORAL
  Filled 2018-04-09: qty 2

## 2018-04-09 NOTE — Progress Notes (Signed)
Small bowel obstruction (HCC)  Subjective: Patient states she is doing better today. The abdominal pain has resolved. Also reports that the back and leg cramping she was previously experiencing has subsided. She is tolerating the liquid diet and denies any N/V. She is passing flatus and is having loose stools. No troubles with ambulating or PO intake.  Objective: Vital signs in last 24 hours: Temp:  [97.6 F (36.4 C)-99 F (37.2 C)] 98.9 F (37.2 C) (05/04 0502) Pulse Rate:  [75-83] 83 (05/04 0502) Resp:  [13-16] 14 (05/04 0502) BP: (114-151)/(60-77) 114/60 (05/04 0502) SpO2:  [93 %-94 %] 94 % (05/04 0502) Last BM Date: 04/07/18  Intake/Output from previous day: 05/03 0701 - 05/04 0700 In: 2600 [P.O.:240; I.V.:2360] Out: 300 [Urine:300] Intake/Output this shift: No intake/output data recorded.  General appearance: alert, cooperative and no distress Resp: clear to auscultation bilaterally Cardio: regular rate and rhythm GI: normal findings: bowel sounds normal and no organomegaly and abnormal findings:  obese and mild tenderness in the LUQ and in the lower abdomen. Ventral hernia is reducible and there is no skin changes around the hernia.  Extremities: no signs of DVT  Lab Results:  Results for orders placed or performed during the hospital encounter of 04/07/18 (from the past 24 hour(s))  Urinalysis, Routine w reflex microscopic     Status: Abnormal   Collection Time: 04/08/18  9:06 PM  Result Value Ref Range   Color, Urine YELLOW YELLOW   APPearance CLEAR CLEAR   Specific Gravity, Urine 1.021 1.005 - 1.030   pH 6.0 5.0 - 8.0   Glucose, UA NEGATIVE NEGATIVE mg/dL   Hgb urine dipstick NEGATIVE NEGATIVE   Bilirubin Urine NEGATIVE NEGATIVE   Ketones, ur 5 (A) NEGATIVE mg/dL   Protein, ur NEGATIVE NEGATIVE mg/dL   Nitrite NEGATIVE NEGATIVE   Leukocytes, UA LARGE (A) NEGATIVE   RBC / HPF 0-5 0 - 5 RBC/hpf   WBC, UA 11-20 0 - 5 WBC/hpf   Bacteria, UA NONE SEEN NONE SEEN   Squamous Epithelial / LPF 0-5 0 - 5   Mucus PRESENT   CBC     Status: Abnormal   Collection Time: 04/09/18  7:33 AM  Result Value Ref Range   WBC 4.4 4.0 - 10.5 K/uL   RBC 4.04 3.87 - 5.11 MIL/uL   Hemoglobin 10.7 (L) 12.0 - 15.0 g/dL   HCT 35.4 (L) 36.0 - 46.0 %   MCV 87.6 78.0 - 100.0 fL   MCH 26.5 26.0 - 34.0 pg   MCHC 30.2 30.0 - 36.0 g/dL   RDW 16.3 (H) 11.5 - 15.5 %   Platelets 192 150 - 400 K/uL     Studies/Results Radiology     MEDS, Scheduled    Assessment: Small bowel obstruction [facility]) Patient is a well-appearing 77 y.o female who was admitted with SBO on 5/2. She is doing better today and is tolerating the liquid diet. Denies pain and N/V. Hernia is reducible and no skin changes noted.   Plan: - Advance to soft diet diet as tolerated - Continue pain medication and anti-emetics PRN - Encourage continued ambulation    LOS: 2 days    Nikie Cid PA-S  04/09/2018 8:48 AM

## 2018-04-09 NOTE — Progress Notes (Signed)
Patient ID: Tonya Miranda, female   DOB: 1941-03-17, 77 y.o.   MRN: [id number]                                                                PROGRESS NOTE                                                                                                                                                                                                             Patient Demographics:    Tonya Miranda, is a 77 y.o. female, DOB - Feb 14, 1941, YQI:[id number]  Admit date - 04/07/2018   Admitting Physician Annita Brod, MD  Outpatient Primary MD for the patient is Chriss Czar, MD  LOS - 2  Outpatient Specialists:     Chief Complaint  Patient presents with  . Abdominal Pain       Brief Narrative     77 y.o.femalewith medical history significant forhypertension, osteoarthritis and multiple hospitalizations in the past, outside of the health system, for small bowel obstruction who was in her usual state of health when yesterday evening she started having abdominal pain located just to the right and above her umbilical area. This pain continued to worsen through the night and today, she started having much more severe pain. She is able to keep a little bit down, but came into the emergency room for further evaluation.  ED Course:In the emergency room, lab work noteworthy for mild acute kidney injury. CT scan of abdomen and pelvis noted small bowel obstruction with transition zone at the level of distal jejunum. No free air or abscess. Also noted to have a small right paracentral ventral hernia containing bowel but no bowel compromise. Patient had an NG tube placed. General surgery consulted and at this time, no immediate surgical issue needed. Hospitalist were called for further evaluation and admission.  Review of Systems: Patient seenin the emergency room. Pt complains ofsome mild pain in her abdomen just to the right of her umbilicus, but feeling much better. She also  comments of some discomfort with her NG tube. She says that she has occasional arthritic pains, but they do not seem to bothering her right now  Pt denies anyheadaches, vision changes, dysphagia, chest pain, palpitations, shortness of breath, wheeze, cough, hematuria, dysuria, constipation, diarrhea, focal extremity numbness  weakness or pain. Review of systems are otherwise negative     Subjective:    Tonya Miranda today is feeling better. Minor tinge of abdominal pain after eating solid food, (diet advanced by surgery)  Pt denies fever, chills, cp, palp, sob, n/v, diarrhea, brbpr, black stool.  Passing flatus.      Assessment  & Plan :    Principal Problem:   Small bowel obstruction (HCC) Active Problems:   Hypertension   GERD (gastroesophageal reflux disease)     Present on Admission: . SBO (small bowel obstruction) ([place]): NGT to low intermittent suction discontinued 5/3 Advance diet as per surgery Pt instructed to let her RN know if abdominal pain is increasing and to contact surgery to evaluate if it is General surgery consulted , appreciate input   . Hypertension:  PRN IV hydralazine while n.p.o.  . GERD (gastroesophageal reflux disease)IV Pepcid  ?History of diabetes: Seen in the problem list. CBG at 132 on admission. Hga1c=6.2  Arthritis: Holding anti-inflammatories  Acute kidney injury: Secondary to dehydration. Gently being hydrated   Code Status :  FULL CODE  Family Communication  :  w patient  Disposition Plan  : home when stable  Barriers For Discharge :   Consults  :  surgery  Procedures  :   DVT Prophylaxis  :  Lovenox - SCDs  Lab Results  Component Value Date   PLT 192 04/09/2018    Antibiotics  :  none  Anti-infectives (From admission, onward)   None        Objective:   Vitals:   04/08/18 0526 04/08/18 1309 04/08/18 2107 04/09/18 0502  BP: 124/68 (!) 151/77 137/65 114/60  Pulse: 78 79 75 83  Resp: 18 16  13 14   Temp: 98.1 F (36.7 C) 99 F (37.2 C) 97.6 F (36.4 C) 98.9 F (37.2 C)  TempSrc: Oral Oral Oral Oral  SpO2: 92% 93% 94% 94%  Weight:      Height:        Wt Readings from Last 3 Encounters:  04/07/18 95.3 kg (210 lb 1.6 oz)  09/01/17 90.7 kg (200 lb)  08/11/17 93 kg (205 lb)     Intake/Output Summary (Last 24 hours) at 04/09/2018 1134 Last data filed at 04/09/2018 1000 Gross per 24 hour  Intake 3080 ml  Output 300 ml  Net 2780 ml     Physical Exam  Awake Alert, Oriented X 3, No new F.N deficits, Normal affect [place].AT,PERRAL Supple Neck,No JVD, No cervical lymphadenopathy appriciated.  Symmetrical Chest wall movement, Good air movement bilaterally, CTAB RRR,No Gallops,Rubs or new Murmurs, No Parasternal Heave +ve B.Sounds, Abd Soft, No tenderness, No organomegaly appriciated, No rebound - guarding or rigidity. No Cyanosis, Clubbing or edema, No new Rash or bruise  NGT out, discontinued    Data Review:    CBC Recent Labs  Lab 04/07/18 1013 04/08/18 0455 04/09/18 0733  WBC 8.5 5.1 4.4  HGB 14.2 10.4* 10.7*  HCT 43.5 33.7* 35.4*  PLT 267 205 192  MCV 84.6 86.9 87.6  MCH 27.6 26.8 26.5  MCHC 32.6 30.9 30.2  RDW 16.0* 16.5* 16.3*    Chemistries  Recent Labs  Lab 04/07/18 1013 04/08/18 0455 04/09/18 0733  NA 141 141 139  K 4.0 3.9 4.0  CL 104 111 110  CO2 24 22 20*  GLUCOSE 132* 94 98  BUN 28* 23* 11  CREATININE 1.10* 0.92 0.69  CALCIUM 9.9 8.2* 8.4*  AST 24  --  18  ALT 17  --  13*  ALKPHOS 105  --  75  BILITOT 1.0  --  0.8   ------------------------------------------------------------------------------------------------------------------ No results for input(s): CHOL, HDL, LDLCALC, TRIG, CHOLHDL, LDLDIRECT in the last 72 hours.  Lab Results  Component Value Date   HGBA1C 6.2 (H) 04/07/2018   ------------------------------------------------------------------------------------------------------------------ No results for input(s): TSH,  T4TOTAL, T3FREE, THYROIDAB in the last 72 hours.  Invalid input(s): FREET3 ------------------------------------------------------------------------------------------------------------------ No results for input(s): VITAMINB12, FOLATE, FERRITIN, TIBC, IRON, RETICCTPCT in the last 72 hours.  Coagulation profile No results for input(s): INR, PROTIME in the last 168 hours.  No results for input(s): DDIMER in the last 72 hours.  Cardiac Enzymes No results for input(s): CKMB, TROPONINI, MYOGLOBIN in the last 168 hours.  Invalid input(s): CK ------------------------------------------------------------------------------------------------------------------ No results found for: BNP  Inpatient Medications  Scheduled Meds: Continuous Infusions: . sodium chloride 1,000 mL (04/09/18 0651)  . famotidine (PEPCID) IV Stopped (04/09/18 1039)   PRN Meds:.fentaNYL (SUBLIMAZE) injection, hydrALAZINE, LORazepam, ondansetron **OR** ondansetron (ZOFRAN) IV, promethazine  Micro Results No results found for this or any previous visit (from the past 240 hour(s)).  Radiology Reports Ct Abdomen Pelvis W Contrast  Result Date: 04/07/2018 CLINICAL DATA:  Abdominal pain with nausea and vomiting. History of ovarian carcinoma EXAM: CT ABDOMEN AND PELVIS WITH CONTRAST TECHNIQUE: Multidetector CT imaging of the abdomen and pelvis was performed using the standard protocol following bolus administration of intravenous contrast. CONTRAST:  166mL ISOVUE-300 IOPAMIDOL (ISOVUE-300) INJECTION 61% COMPARISON:  September 01, 2017 FINDINGS: Lower chest: There is mild bibasilar scarring with mild lower lobe bronchiectatic change. No airspace consolidation in the lung bases. There are foci of coronary artery calcification. Hepatobiliary: No focal liver lesions are evident. Gallbladder is absent. There remains diffuse intrahepatic biliary duct dilatation. There remains dilatation of the common hepatic duct measuring 1.9 cm with  tapering distally. No biliary duct mass or calculus evident. Pancreas: There is no pancreatic mass or inflammatory focus. Spleen: No splenic lesions are evident. Adrenals/Urinary Tract: Adrenals bilaterally appear normal. There is no appreciable renal mass or hydronephrosis on either side. There is no appreciable renal or ureteral calculus on either side. Urinary bladder is midline with wall thickness within normal limits. Stomach/Bowel: There is small bowel dilatation proximally with a transition zone in the distal jejunal region consistent with a degree of small bowel obstruction. No free air or portal venous air is evident. There are sigmoid diverticula without diverticulitis. There is no colonic wall dilatation or wall thickening. Note that a loop of small bowel extends into a small ventral hernia just to the right of midline. No obstruction is seen in this area; there is no bowel compromise in the area of this small hernia. Vascular/Lymphatic: There is atherosclerotic calcification in the aorta and common iliac arteries. Aorta is tortuous, but there is no aneurysm. Major mesenteric vessels appear patent. There is no appreciable adenopathy in the abdomen or pelvis. Reproductive: Uterus is absent.  No evident pelvic mass. Other: No omental lesions are apparent on this study. Appendix not visualized. No periappendiceal region inflammation evident. There are surgical clips in the mid retroperitoneal region. No abscess or ascites evident in the abdomen or pelvis. Musculoskeletal: There is extensive degenerative type change in the lower thoracic and lumbar regions. Moderate spinal stenosis at L4-5 is again noted, due to disc protrusion and bony hypertrophy. No blastic or lytic bone lesions. No intramuscular lesions are evident. There is scarring in the abdominal wall anteriorly near the small right paracentral ventral hernia. IMPRESSION:  1. Small bowel obstruction with transition zone at the level of the distal  jejunum. No free air. No abscess. No periappendiceal region inflammation. 2. No omental lesions evident. Uterus absent. No pelvic mass appreciable. Next 3. Gallbladder absent. Biliary duct dilatation, stable, without obstructing lesion evident. 4.  No renal or ureteral calculus.  No hydronephrosis. 5. There is aortoiliac atherosclerosis. There are foci of coronary artery calcification. 6. Small right paracentral ventral hernia which contains bowel but no bowel compromise. Aortic Atherosclerosis (ICD10-I70.0). Electronically Signed   By: Lowella Grip III M.D.   On: 04/07/2018 13:33   Acute Abdominal Series  Result Date: 04/08/2018 CLINICAL DATA:  Mid abdominal pain, NG tube placement, history of ovarian carcinoma EXAM: DG ABDOMEN ACUTE W/ 1V CHEST COMPARISON:  Abdomen films of 04/08/2017 and chest x-ray of 08/10/2012 FINDINGS: No active infiltrate or effusion is seen. NG tube coils in the fundus of the stomach. Mediastinal and hilar contours are unremarkable and the heart is mildly enlarged. No bony abnormality is seen. Supine and erect views of the abdomen show no bowel obstruction. No free air is seen. NG tube coils in the fundus of the stomach. There is contrast throughout the nondistended colon. Minimally prominent small bowel loop is noted in the left abdomen of questionable significance. Surgical clips are present in the right upper quadrant from prior cholecystectomy. Lumbar scoliosis is noted convex to the left with diffuse degenerative change. IMPRESSION: 1. No active lung disease. NG tube extends into the stomach coiling in the fundus. 2. No definite bowel obstruction. Contrast is scattered throughout the nondistended colon. Electronically Signed   By: Ivar Drape M.D.   On: 04/08/2018 09:52   Dg Abd Portable 1v-small Bowel Obstruction Protocol-initial, 8 Hr Delay  Result Date: 04/08/2018 CLINICAL DATA:  77 year old female with small bowel series, 8 hour postcontrast image. EXAM: PORTABLE ABDOMEN  - 1 VIEW COMPARISON:  Earlier radiograph dated 04/07/2018 and CT of the abdomen pelvis dated 04/07/2018 FINDINGS: Partially visualized enteric tube in the upper abdomen likely in the stomach. There is passage of contrast into the colon with contrast seen in the rectosigmoid. IMPRESSION: Oral contrast has passed into the large bowel. Electronically Signed   By: Anner Crete M.D.   On: 04/08/2018 02:52   Dg Abd Portable 1v-small Bowel Protocol-position Verification  Result Date: 04/07/2018 CLINICAL DATA:  Evaluate NG tube position EXAM: PORTABLE ABDOMEN - 1 VIEW COMPARISON:  CT abdomen pelvis of 04/07/2018 FINDINGS: The NG tube coils within the fundus of the stomach with the tip remaining within the fundus of the stomach. The bowel gas pattern is nonspecific. Contrast is being excreted by the kidneys with some opacification of the urinary bladder. Degenerative changes are noted throughout the lumbar spine. IMRESSION: NG tube coils in the fundus with the tip remaining in the fundus of the stomach. Electronically Signed   By: Ivar Drape M.D.   On: 04/07/2018 16:59    Time Spent in minutes  30   Jani Gravel M.D on 04/09/2018 at 11:34 AM  Between 7am to 7pm - Pager - [phone]    After 7pm go to [email or web address] - password [email or web address]  [facility] -  Office  [phone]

## 2018-04-10 LAB — COMPREHENSIVE METABOLIC PANEL
ALBUMIN: 3.6 g/dL (ref 3.5–5.0)
ALK PHOS: 92 U/L (ref 38–126)
ALT: 14 U/L (ref 14–54)
ANION GAP: 9 (ref 5–15)
AST: 22 U/L (ref 15–41)
BUN: 11 mg/dL (ref 6–20)
CALCIUM: 9 mg/dL (ref 8.9–10.3)
CO2: 22 mmol/L (ref 22–32)
Chloride: 108 mmol/L (ref 101–111)
Creatinine, Ser: 0.81 mg/dL (ref 0.44–1.00)
GFR calc Af Amer: >60 mL/min (ref >60)
GFR calc non Af Amer: >60 mL/min (ref >60)
GLUCOSE: 111 mg/dL — AB (ref 65–99)
Potassium: 3.7 mmol/L (ref 3.5–5.1)
SODIUM: 139 mmol/L (ref 135–145)
TOTAL PROTEIN: 6.8 g/dL (ref 6.5–8.1)
Total Bilirubin: 0.7 mg/dL (ref 0.3–1.2)

## 2018-04-10 LAB — CBC
HEMATOCRIT: 39 % (ref 36.0–46.0)
HEMOGLOBIN: 12.1 g/dL (ref 12.0–15.0)
MCH: 26.9 pg (ref 26.0–34.0)
MCHC: 31 g/dL (ref 30.0–36.0)
MCV: 86.7 fL (ref 78.0–100.0)
Platelets: 230 10*3/uL (ref 150–400)
RBC: 4.5 MIL/uL (ref 3.87–5.11)
RDW: 16.1 % — ABNORMAL HIGH (ref 11.5–15.5)
WBC: 4.8 10*3/uL (ref 4.0–10.5)

## 2018-04-10 MED ORDER — FAMOTIDINE 20 MG PO TABS
20.0000 mg | ORAL_TABLET | Freq: Once | ORAL | Status: AC
  Administered 2018-04-10: 20 mg via ORAL
  Filled 2018-04-10: qty 1

## 2018-04-10 MED ORDER — ENOXAPARIN SODIUM 40 MG/0.4ML ~~LOC~~ SOLN
40.0000 mg | SUBCUTANEOUS | Status: DC
  Administered 2018-04-10: 40 mg via SUBCUTANEOUS
  Filled 2018-04-10: qty 0.4

## 2018-04-10 NOTE — Progress Notes (Signed)
Small bowel obstruction (HCC)  Subjective: Patient states she is doing well today. She was started on a soft diet yesterday but patient stopped eating soon after because it caused her abdominal pain to flare up. She is in no pain currently. She is passing flatus and has had a bowel movement. Denies N/V. No difficulties with ambulation.   Objective: Vital signs in last 24 hours: Temp:  [97.7 F (36.5 C)-99.1 F (37.3 C)] 98.4 F (36.9 C) (05/05 0511) Pulse Rate:  [71-72] 71 (05/05 0511) Resp:  [14] 14 (05/05 0511) BP: (135-139)/(67-79) 135/79 (05/05 0511) SpO2:  [92 %-93 %] 93 % (05/05 0511) Last BM Date: 04/09/18  Intake/Output from previous day: 05/04 0701 - 05/05 0700 In: 2475 [P.O.:600; I.V.:1725; IV Piggyback:150] Out: -  Intake/Output this shift: No intake/output data recorded.  General appearance: alert, cooperative and no distress Resp: clear to auscultation bilaterally Cardio: regular rate and rhythm GI: soft, non-tender; bowel sounds normal; no masses,  no organomegaly  Lab Results:  Results for orders placed or performed during the hospital encounter of 04/07/18 (from the past 24 hour(s))  Comprehensive metabolic panel     Status: Abnormal   Collection Time: 04/10/18  4:20 AM  Result Value Ref Range   Sodium 139 135 - 145 mmol/L   Potassium 3.7 3.5 - 5.1 mmol/L   Chloride 108 101 - 111 mmol/L   CO2 22 22 - 32 mmol/L   Glucose, Bld 111 (H) 65 - 99 mg/dL   BUN 11 6 - 20 mg/dL   Creatinine, Ser 0.81 0.44 - 1.00 mg/dL   Calcium 9.0 8.9 - 10.3 mg/dL   Total Protein 6.8 6.5 - 8.1 g/dL   Albumin 3.6 3.5 - 5.0 g/dL   AST 22 15 - 41 U/L   ALT 14 14 - 54 U/L   Alkaline Phosphatase 92 38 - 126 U/L   Total Bilirubin 0.7 0.3 - 1.2 mg/dL   GFR calc non Af Amer >60 >60 mL/min   GFR calc Af Amer >60 >60 mL/min   Anion gap 9 5 - 15  CBC     Status: Abnormal   Collection Time: 04/10/18  4:20 AM  Result Value Ref Range   WBC 4.8 4.0 - 10.5 K/uL   RBC 4.50 3.87 - 5.11  MIL/uL   Hemoglobin 12.1 12.0 - 15.0 g/dL   HCT 39.0 36.0 - 46.0 %   MCV 86.7 78.0 - 100.0 fL   MCH 26.9 26.0 - 34.0 pg   MCHC 31.0 30.0 - 36.0 g/dL   RDW 16.1 (H) 11.5 - 15.5 %   Platelets 230 150 - 400 K/uL     Studies/Results Radiology     MEDS, Scheduled    Assessment: Small bowel obstruction [facility]) Patient is a well-appearing 77 y.o female who was admitted with SBO on 5/2. She is doing well today. She did not tolerate the advancement to soft diet claiming that it caused her abdominal pain. She has passed flatus and had a BM. Denies pain and N/V at this time. Hernia is reducible and no skin changes noted.   Plan: - Try to advance diet as tolerated - Continue pain medication and anti-emetics as needed - Continue IVF - Encourage continued ambulation   LOS: 3 days    [employer] [facility], PA-S  04/10/2018 7:55 AM

## 2018-04-10 NOTE — Progress Notes (Signed)
Patient ID: Tonya Miranda, female   DOB: 03/17/1941, 77 y.o.   MRN: [id number]                                                                PROGRESS NOTE                                                                                                                                                                                                             Patient Demographics:    Sheresa Cullop, is a 77 y.o. female, DOB - 1941-07-15, IRW:[id number]  Admit date - 04/07/2018   Admitting Physician Annita Brod, MD  Outpatient Primary MD for the patient is Chriss Czar, MD  LOS - 3  Outpatient Specialists:     Chief Complaint  Patient presents with  . Abdominal Pain       Brief Narrative   77 y.o.femalewith medical history significant forhypertension, osteoarthritis and multiple hospitalizations in the past, outside of the health system, for small bowel obstruction who was in her usual state of health when yesterday evening she started having abdominal pain located just to the right and above her umbilical area. This pain continued to worsen through the night and today, she started having much more severe pain. She is able to keep a little bit down, but came into the emergency room for further evaluation.  ED Course:In the emergency room, lab work noteworthy for mild acute kidney injury. CT scan of abdomen and pelvis noted small bowel obstruction with transition zone at the level of distal jejunum. No free air or abscess. Also noted to have a small right paracentral ventral hernia containing bowel but no bowel compromise. Patient had an NG tube placed. General surgery consulted and at this time, no immediate surgical issue needed. Hospitalist were called for further evaluation and admission.  Review of Systems: Patient seenin the emergency room. Pt complains ofsome mild pain in her abdomen just to the right of her umbilicus, but feeling much better. She also  comments of some discomfort with her NG tube. She says that she has occasional arthritic pains, but they do not seem to bothering her right now  Pt denies anyheadaches, vision changes, dysphagia, chest pain, palpitations, shortness of breath, wheeze, cough, hematuria, dysuria, constipation, diarrhea, focal extremity numbness weakness or  pain. Review of systems are otherwise negative      Subjective:    [place] Hagans today states that her abdominal pain slightly better. She is going to try to advance her diet to soft today.    No headache,  No chest pain, No abdominal pain - No Nausea, No Diarrhea, NO brbpr, No black stool.   No new weakness tingling or numbness, No Cough - SOB.    Assessment  & Plan :    Principal Problem:   Small bowel obstruction (HCC) Active Problems:   Hypertension   GERD (gastroesophageal reflux disease)     Present on Admission:   SBO (small bowel obstruction) ([place]): NGT to low intermittent suction discontinued 5/3 Advance diet as per surgery  Cont Ns at 11mL per hour due to reduced po intake Tramadol and Fentanyl prn pain Pt instructed to let her RN know if abdominal pain is increasing and to contact surgery to evaluate if it is General surgery consulted , appreciate input  Hypertension:  PRN IV hydralazine  GERD (gastroesophageal reflux disease)IV Pepcid  ?History of diabetes: Seen in the problem list. CBG at 132 on admission.Hga1c=6.2  Arthritis: Holding anti-inflammatories  Acute kidney injury: Secondary to dehydration. Gently being hydrated  Anxiety: lorazepam 1mg  iv q6h prn anxiety  Code Status :  FULL CODE  Family Communication  :  w patient  Disposition Plan  : home when stable  Barriers For Discharge :   Consults  :  surgery  Procedures  :   DVT Prophylaxis  :  Lovenox - SCDs    Lab Results  Component Value Date   PLT 230 04/10/2018    Antibiotics  :  none  Anti-infectives (From  admission, onward)   None        Objective:   Vitals:   04/09/18 0502 04/09/18 1505 04/09/18 2118 04/10/18 0511  BP: 114/60 138/67 139/70 135/79  Pulse: 83 71 72 71  Resp: 14 14 14 14   Temp: 98.9 F (37.2 C) 99.1 F (37.3 C) 97.7 F (36.5 C) 98.4 F (36.9 C)  TempSrc: Oral Oral Oral Oral  SpO2: 94% 92% 92% 93%  Weight:      Height:        Wt Readings from Last 3 Encounters:  04/07/18 95.3 kg (210 lb 1.6 oz)  09/01/17 90.7 kg (200 lb)  08/11/17 93 kg (205 lb)     Intake/Output Summary (Last 24 hours) at 04/10/2018 1059 Last data filed at 04/10/2018 1006 Gross per 24 hour  Intake 2355 ml  Output -  Net 2355 ml     Physical Exam  Awake Alert, Oriented X 3, No new F.N deficits, Normal affect [place].AT,PERRAL Supple Neck,No JVD, No cervical lymphadenopathy appriciated.  Symmetrical Chest wall movement, Good air movement bilaterally, CTAB RRR,No Gallops,Rubs or new Murmurs, No Parasternal Heave +ve B.Sounds, Abd Soft, No tenderness, No organomegaly appriciated, No rebound - guarding or rigidity. No Cyanosis, Clubbing or edema, No new Rash or bruise       Data Review:    CBC Recent Labs  Lab 04/07/18 1013 04/08/18 0455 04/09/18 0733 04/10/18 0420  WBC 8.5 5.1 4.4 4.8  HGB 14.2 10.4* 10.7* 12.1  HCT 43.5 33.7* 35.4* 39.0  PLT 267 205 192 230  MCV 84.6 86.9 87.6 86.7  MCH 27.6 26.8 26.5 26.9  MCHC 32.6 30.9 30.2 31.0  RDW 16.0* 16.5* 16.3* 16.1*    Chemistries  Recent Labs  Lab 04/07/18 1013 04/08/18 0455 04/09/18 0733 04/10/18  0420  NA 141 141 139 139  K 4.0 3.9 4.0 3.7  CL 104 111 110 108  CO2 24 22 20* 22  GLUCOSE 132* 94 98 111*  BUN 28* 23* 11 11  CREATININE 1.10* 0.92 0.69 0.81  CALCIUM 9.9 8.2* 8.4* 9.0  AST 24  --  18 22  ALT 17  --  13* 14  ALKPHOS 105  --  75 92  BILITOT 1.0  --  0.8 0.7   ------------------------------------------------------------------------------------------------------------------ No results for input(s): CHOL,  HDL, LDLCALC, TRIG, CHOLHDL, LDLDIRECT in the last 72 hours.  Lab Results  Component Value Date   HGBA1C 6.2 (H) 04/07/2018   ------------------------------------------------------------------------------------------------------------------ No results for input(s): TSH, T4TOTAL, T3FREE, THYROIDAB in the last 72 hours.  Invalid input(s): FREET3 ------------------------------------------------------------------------------------------------------------------ No results for input(s): VITAMINB12, FOLATE, FERRITIN, TIBC, IRON, RETICCTPCT in the last 72 hours.  Coagulation profile No results for input(s): INR, PROTIME in the last 168 hours.  No results for input(s): DDIMER in the last 72 hours.  Cardiac Enzymes No results for input(s): CKMB, TROPONINI, MYOGLOBIN in the last 168 hours.  Invalid input(s): CK ------------------------------------------------------------------------------------------------------------------ No results found for: BNP  Inpatient Medications  Scheduled Meds: Continuous Infusions: . sodium chloride 75 mL/hr at 04/09/18 2110  . famotidine (PEPCID) IV 20 mg (04/10/18 0915)   PRN Meds:.acetaminophen, fentaNYL (SUBLIMAZE) injection, hydrALAZINE, LORazepam, ondansetron **OR** ondansetron (ZOFRAN) IV, promethazine, traMADol  Micro Results No results found for this or any previous visit (from the past 240 hour(s)).  Radiology Reports Ct Abdomen Pelvis W Contrast  Result Date: 04/07/2018 CLINICAL DATA:  Abdominal pain with nausea and vomiting. History of ovarian carcinoma EXAM: CT ABDOMEN AND PELVIS WITH CONTRAST TECHNIQUE: Multidetector CT imaging of the abdomen and pelvis was performed using the standard protocol following bolus administration of intravenous contrast. CONTRAST:  180mL ISOVUE-300 IOPAMIDOL (ISOVUE-300) INJECTION 61% COMPARISON:  September 01, 2017 FINDINGS: Lower chest: There is mild bibasilar scarring with mild lower lobe bronchiectatic change. No  airspace consolidation in the lung bases. There are foci of coronary artery calcification. Hepatobiliary: No focal liver lesions are evident. Gallbladder is absent. There remains diffuse intrahepatic biliary duct dilatation. There remains dilatation of the common hepatic duct measuring 1.9 cm with tapering distally. No biliary duct mass or calculus evident. Pancreas: There is no pancreatic mass or inflammatory focus. Spleen: No splenic lesions are evident. Adrenals/Urinary Tract: Adrenals bilaterally appear normal. There is no appreciable renal mass or hydronephrosis on either side. There is no appreciable renal or ureteral calculus on either side. Urinary bladder is midline with wall thickness within normal limits. Stomach/Bowel: There is small bowel dilatation proximally with a transition zone in the distal jejunal region consistent with a degree of small bowel obstruction. No free air or portal venous air is evident. There are sigmoid diverticula without diverticulitis. There is no colonic wall dilatation or wall thickening. Note that a loop of small bowel extends into a small ventral hernia just to the right of midline. No obstruction is seen in this area; there is no bowel compromise in the area of this small hernia. Vascular/Lymphatic: There is atherosclerotic calcification in the aorta and common iliac arteries. Aorta is tortuous, but there is no aneurysm. Major mesenteric vessels appear patent. There is no appreciable adenopathy in the abdomen or pelvis. Reproductive: Uterus is absent.  No evident pelvic mass. Other: No omental lesions are apparent on this study. Appendix not visualized. No periappendiceal region inflammation evident. There are surgical clips in the mid retroperitoneal region. No  abscess or ascites evident in the abdomen or pelvis. Musculoskeletal: There is extensive degenerative type change in the lower thoracic and lumbar regions. Moderate spinal stenosis at L4-5 is again noted, due to  disc protrusion and bony hypertrophy. No blastic or lytic bone lesions. No intramuscular lesions are evident. There is scarring in the abdominal wall anteriorly near the small right paracentral ventral hernia. IMPRESSION: 1. Small bowel obstruction with transition zone at the level of the distal jejunum. No free air. No abscess. No periappendiceal region inflammation. 2. No omental lesions evident. Uterus absent. No pelvic mass appreciable. Next 3. Gallbladder absent. Biliary duct dilatation, stable, without obstructing lesion evident. 4.  No renal or ureteral calculus.  No hydronephrosis. 5. There is aortoiliac atherosclerosis. There are foci of coronary artery calcification. 6. Small right paracentral ventral hernia which contains bowel but no bowel compromise. Aortic Atherosclerosis (ICD10-I70.0). Electronically Signed   By: Lowella Grip III M.D.   On: 04/07/2018 13:33   Acute Abdominal Series  Result Date: 04/08/2018 CLINICAL DATA:  Mid abdominal pain, NG tube placement, history of ovarian carcinoma EXAM: DG ABDOMEN ACUTE W/ 1V CHEST COMPARISON:  Abdomen films of 04/08/2017 and chest x-ray of 08/10/2012 FINDINGS: No active infiltrate or effusion is seen. NG tube coils in the fundus of the stomach. Mediastinal and hilar contours are unremarkable and the heart is mildly enlarged. No bony abnormality is seen. Supine and erect views of the abdomen show no bowel obstruction. No free air is seen. NG tube coils in the fundus of the stomach. There is contrast throughout the nondistended colon. Minimally prominent small bowel loop is noted in the left abdomen of questionable significance. Surgical clips are present in the right upper quadrant from prior cholecystectomy. Lumbar scoliosis is noted convex to the left with diffuse degenerative change. IMPRESSION: 1. No active lung disease. NG tube extends into the stomach coiling in the fundus. 2. No definite bowel obstruction. Contrast is scattered throughout the  nondistended colon. Electronically Signed   By: Ivar Drape M.D.   On: 04/08/2018 09:52   Dg Abd Portable 1v-small Bowel Obstruction Protocol-initial, 8 Hr Delay  Result Date: 04/08/2018 CLINICAL DATA:  77 year old female with small bowel series, 8 hour postcontrast image. EXAM: PORTABLE ABDOMEN - 1 VIEW COMPARISON:  Earlier radiograph dated 04/07/2018 and CT of the abdomen pelvis dated 04/07/2018 FINDINGS: Partially visualized enteric tube in the upper abdomen likely in the stomach. There is passage of contrast into the colon with contrast seen in the rectosigmoid. IMPRESSION: Oral contrast has passed into the large bowel. Electronically Signed   By: Anner Crete M.D.   On: 04/08/2018 02:52   Dg Abd Portable 1v-small Bowel Protocol-position Verification  Result Date: 04/07/2018 CLINICAL DATA:  Evaluate NG tube position EXAM: PORTABLE ABDOMEN - 1 VIEW COMPARISON:  CT abdomen pelvis of 04/07/2018 FINDINGS: The NG tube coils within the fundus of the stomach with the tip remaining within the fundus of the stomach. The bowel gas pattern is nonspecific. Contrast is being excreted by the kidneys with some opacification of the urinary bladder. Degenerative changes are noted throughout the lumbar spine. IMRESSION: NG tube coils in the fundus with the tip remaining in the fundus of the stomach. Electronically Signed   By: Ivar Drape M.D.   On: 04/07/2018 16:59    Time Spent in minutes  30   Jani Gravel M.D on 04/10/2018 at 10:59 AM  Between 7am to 7pm - Pager - [phone]   After 7pm go to [email or web address] -  password [facility]  [facility] -  Office  [phone]

## 2018-04-10 NOTE — Plan of Care (Signed)
Plan of care discussed with patient 

## 2018-04-11 LAB — CBC
HEMATOCRIT: 36.4 % (ref 36.0–46.0)
HEMOGLOBIN: 11.6 g/dL — AB (ref 12.0–15.0)
MCH: 26.8 pg (ref 26.0–34.0)
MCHC: 31.9 g/dL (ref 30.0–36.0)
MCV: 84.1 fL (ref 78.0–100.0)
Platelets: 192 10*3/uL (ref 150–400)
RBC: 4.33 MIL/uL (ref 3.87–5.11)
RDW: 16 % — ABNORMAL HIGH (ref 11.5–15.5)
WBC: 3.6 10*3/uL — ABNORMAL LOW (ref 4.0–10.5)

## 2018-04-11 LAB — COMPREHENSIVE METABOLIC PANEL
ALBUMIN: 3.1 g/dL — AB (ref 3.5–5.0)
ALK PHOS: 78 U/L (ref 38–126)
ALT: 11 U/L — ABNORMAL LOW (ref 14–54)
ANION GAP: 8 (ref 5–15)
AST: 15 U/L (ref 15–41)
BILIRUBIN TOTAL: 0.5 mg/dL (ref 0.3–1.2)
BUN: 11 mg/dL (ref 6–20)
CALCIUM: 8.8 mg/dL — AB (ref 8.9–10.3)
CO2: 21 mmol/L — ABNORMAL LOW (ref 22–32)
Chloride: 110 mmol/L (ref 101–111)
Creatinine, Ser: 0.74 mg/dL (ref 0.44–1.00)
GFR calc Af Amer: >60 mL/min (ref >60)
GLUCOSE: 104 mg/dL — AB (ref 65–99)
Potassium: 3.6 mmol/L (ref 3.5–5.1)
Sodium: 139 mmol/L (ref 135–145)
TOTAL PROTEIN: 6.2 g/dL — AB (ref 6.5–8.1)

## 2018-04-11 NOTE — Progress Notes (Signed)
Discharge instructions viewed with patient. All questions answered. Patient wheeled to vehicle with belongings by Nurse tech

## 2018-04-11 NOTE — Discharge Summary (Signed)
Tonya Miranda, is a 77 y.o. female  DOB 03-Nov-1941  MRN [id number].  Admission date:  04/07/2018  Admitting Physician  Annita Brod, MD  Discharge Date:  04/11/2018   Primary MD  Chriss Czar, MD  Recommendations for primary care physician for things to follow:     Present on Admission:   SBO (small bowel obstruction) [facility]): Please f/u with general surgery in 1 week  Hypertension:  Cont amlodipine 5mg  po qday Please f/u with pcp in 1 week for bp check  GERD (gastroesophageal reflux disease) Resume protonix 40mg  po qday  ?History of diabetes: Seen in the problem list. CBG at 132 on admission.Hga1c=6.2 Please f/u with pcp in 1 week for dietary advice  Arthritis:  May resume mobic  Acute kidney injury: Secondary to dehydration pcp to please  check cmp in 1 week  Mild Anemia pcp to please check cbc in 1 week  Anxiety: stable      Admission Diagnosis  Small bowel obstruction ([place]) [K56.609] SBO (small bowel obstruction) ([place]) [K56.609] Encounter for imaging study to confirm nasogastric (NG) tube placement [Z01.89] Ventral hernia with obstruction and without gangrene [K43.6]   Discharge Diagnosis  Small bowel obstruction ([place]) [K56.609] SBO (small bowel obstruction) ([place]) [G53.646] Encounter for imaging study to confirm nasogastric (NG) tube placement [Z01.89] Ventral hernia with obstruction and without gangrene [K43.6]     Principal Problem:   Small bowel obstruction ([place]) Active Problems:   Hypertension   GERD (gastroesophageal reflux disease)      Past Medical History:  Diagnosis Date  . 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 Chronic headaches   . Diabetes mellitus   . Diverticulosis of sigmoid colon 04/02/2008   Dr. Oretha Ellis  . Fibromyalgia   . GERD (gastroesophageal reflux disease)   . Hypertension   . Osteoarthritis   . Ovarian cancer ([place])     . PONV (postoperative nausea and vomiting)     Past Surgical History:  Procedure Laterality Date  . ABDOMINAL HYSTERECTOMY     ovarian cancer  . BRAIN SURGERY     aneurysm  . CHOLECYSTECTOMY    . COLONOSCOPY  04/02/2008   Dr. Oretha Ellis, [place].  Marland Kitchen COLONOSCOPY W/ BIOPSIES  09/09/1999   Dr. Durwin Reges Imam  . CRANIECTOMY  08/10/2012   Procedure: CRANIECTOMY FOR TIC DOULOUREUX;  Surgeon: Hosie Spangle, MD;  Location: [facility];  Service: Neurosurgery;  Laterality: Right;  Right Retromastoid Craniectomy with Microvascular Decompression, Decompression of the trigeminal nerve  . ESOPHAGOGASTRODUODENOSCOPY  01/07/2012   Dr. Oretha Ellis, [place].  . HERNIA REPAIR     left inguinal repair and adhesion repair       HPI  from the history and physical done on the day of admission:     77 y.o.femalewith medical history significant forhypertension, osteoarthritis and multiple hospitalizations in the past, outside of the health system, for small bowel obstruction who was in her usual state of health when yesterday evening she started having  abdominal pain located just to the right and above her umbilical area. This pain continued to worsen through the night and today, she started having much more severe pain. She is able to keep a little bit down, but came into the emergency room for further evaluation.  ED Course:In the emergency room, lab work noteworthy for mild acute kidney injury. CT scan of abdomen and pelvis noted small bowel obstruction with transition zone at the level of distal jejunum. No free air or abscess. Also noted to have a small right paracentral ventral hernia containing bowel but no bowel compromise. Patient had an NG tube placed. General surgery consulted and at this time, no immediate surgical issue needed. Hospitalist were called for further evaluation and admission.  Review of Systems: Patient seenin the emergency room. Pt complains ofsome mild pain in her  abdomen just to the right of her umbilicus, but feeling much better. She also comments of some discomfort with her NG tube. She says that she has occasional arthritic pains, but they do not seem to bothering her right now  Pt denies anyheadaches, vision changes, dysphagia, chest pain, palpitations, shortness of breath, wheeze, cough, hematuria, dysuria, constipation, diarrhea, focal extremity numbness weakness or pain. Review of systems are otherwise negative         Hospital Course:      Pt was admitted 5/2  for SBO.  Pt was made NPO and placed on NGT to low intermittent suction.  Surgery was consulted, pt's pain improved.  Diet was advanced.  On 5/4 pt had slight increase in pain. Pt went back to liquids and then slowly advanced her diet. She is doing well this am and not having any pain and walking around in the hallways.  Pt appears stable and will be discharged to home.    Follow UP  Follow-up Information    Chriss Czar, MD Follow up in 1 week(s).   Specialty:  Family Medicine Contact information: [place] [address] [phone]        Surgery, [place] Follow up in 1 week(s).   Specialty:  General Surgery Contact information: [place] [address] [phone]            Consults obtained - surgery  Discharge Condition: stable  Diet and Activity recommendation: See Discharge Instructions below  Discharge Instructions      Discharge Medications     Allergies as of 04/11/2018      Reactions   Latex Dermatitis   Morphine And Related    Hallucinations, headache      Medication List    TAKE these medications   acetaminophen 500 MG tablet Commonly known as:  TYLENOL Take 500 mg by mouth every 6 (six) hours as needed for moderate pain.   amLODipine 5 MG tablet Commonly known as:  NORVASC Take 5 mg by mouth daily.   cetirizine 10 MG tablet Commonly known as:  ZYRTEC Take 10 mg by  mouth daily.   diphenhydrAMINE 25 MG tablet Commonly known as:  BENADRYL Take 25 mg by mouth as needed for allergies.   fluticasone 50 MCG/ACT nasal spray Commonly known as:  FLONASE Place 2 sprays into the nose daily.   GELATIN PO Take 1 Dose by mouth daily.   ibuprofen 200 MG tablet Commonly known as:  ADVIL,MOTRIN Take 200 mg by mouth every 6 (six) hours as needed for mild pain.   meloxicam 7.5 MG tablet Commonly known as:  MOBIC  Take 7.5 mg by mouth 2 (two) times daily as needed.   multivitamin with minerals Tabs tablet Take 1 tablet by mouth daily.   pantoprazole 40 MG tablet Commonly known as:  PROTONIX Take 40 mg by mouth daily as needed. Heart Burn   TURMERIC PO Take 1 Dose by mouth daily as needed. Arthritis       Major procedures and Radiology Reports - PLEASE review detailed and final reports for all details, in brief -       Ct Abdomen Pelvis W Contrast  Result Date: 04/07/2018 CLINICAL DATA:  Abdominal pain with nausea and vomiting. History of ovarian carcinoma EXAM: CT ABDOMEN AND PELVIS WITH CONTRAST TECHNIQUE: Multidetector CT imaging of the abdomen and pelvis was performed using the standard protocol following bolus administration of intravenous contrast. CONTRAST:  176mL ISOVUE-300 IOPAMIDOL (ISOVUE-300) INJECTION 61% COMPARISON:  September 01, 2017 FINDINGS: Lower chest: There is mild bibasilar scarring with mild lower lobe bronchiectatic change. No airspace consolidation in the lung bases. There are foci of coronary artery calcification. Hepatobiliary: No focal liver lesions are evident. Gallbladder is absent. There remains diffuse intrahepatic biliary duct dilatation. There remains dilatation of the common hepatic duct measuring 1.9 cm with tapering distally. No biliary duct mass or calculus evident. Pancreas: There is no pancreatic mass or inflammatory focus. Spleen: No splenic lesions are evident. Adrenals/Urinary Tract: Adrenals bilaterally appear  normal. There is no appreciable renal mass or hydronephrosis on either side. There is no appreciable renal or ureteral calculus on either side. Urinary bladder is midline with wall thickness within normal limits. Stomach/Bowel: There is small bowel dilatation proximally with a transition zone in the distal jejunal region consistent with a degree of small bowel obstruction. No free air or portal venous air is evident. There are sigmoid diverticula without diverticulitis. There is no colonic wall dilatation or wall thickening. Note that a loop of small bowel extends into a small ventral hernia just to the right of midline. No obstruction is seen in this area; there is no bowel compromise in the area of this small hernia. Vascular/Lymphatic: There is atherosclerotic calcification in the aorta and common iliac arteries. Aorta is tortuous, but there is no aneurysm. Major mesenteric vessels appear patent. There is no appreciable adenopathy in the abdomen or pelvis. Reproductive: Uterus is absent.  No evident pelvic mass. Other: No omental lesions are apparent on this study. Appendix not visualized. No periappendiceal region inflammation evident. There are surgical clips in the mid retroperitoneal region. No abscess or ascites evident in the abdomen or pelvis. Musculoskeletal: There is extensive degenerative type change in the lower thoracic and lumbar regions. Moderate spinal stenosis at L4-5 is again noted, due to disc protrusion and bony hypertrophy. No blastic or lytic bone lesions. No intramuscular lesions are evident. There is scarring in the abdominal wall anteriorly near the small right paracentral ventral hernia. IMPRESSION: 1. Small bowel obstruction with transition zone at the level of the distal jejunum. No free air. No abscess. No periappendiceal region inflammation. 2. No omental lesions evident. Uterus absent. No pelvic mass appreciable. Next 3. Gallbladder absent. Biliary duct dilatation, stable, without  obstructing lesion evident. 4.  No renal or ureteral calculus.  No hydronephrosis. 5. There is aortoiliac atherosclerosis. There are foci of coronary artery calcification. 6. Small right paracentral ventral hernia which contains bowel but no bowel compromise. Aortic Atherosclerosis (ICD10-I70.0). Electronically Signed   By: Lowella Grip III M.D.   On: 04/07/2018 13:33   Acute Abdominal Series  Result  Date: 04/08/2018 CLINICAL DATA:  Mid abdominal pain, NG tube placement, history of ovarian carcinoma EXAM: DG ABDOMEN ACUTE W/ 1V CHEST COMPARISON:  Abdomen films of 04/08/2017 and chest x-ray of 08/10/2012 FINDINGS: No active infiltrate or effusion is seen. NG tube coils in the fundus of the stomach. Mediastinal and hilar contours are unremarkable and the heart is mildly enlarged. No bony abnormality is seen. Supine and erect views of the abdomen show no bowel obstruction. No free air is seen. NG tube coils in the fundus of the stomach. There is contrast throughout the nondistended colon. Minimally prominent small bowel loop is noted in the left abdomen of questionable significance. Surgical clips are present in the right upper quadrant from prior cholecystectomy. Lumbar scoliosis is noted convex to the left with diffuse degenerative change. IMPRESSION: 1. No active lung disease. NG tube extends into the stomach coiling in the fundus. 2. No definite bowel obstruction. Contrast is scattered throughout the nondistended colon. Electronically Signed   By: Ivar Drape M.D.   On: 04/08/2018 09:52   Dg Abd Portable 1v-small Bowel Obstruction Protocol-initial, 8 Hr Delay  Result Date: 04/08/2018 CLINICAL DATA:  77 year old female with small bowel series, 8 hour postcontrast image. EXAM: PORTABLE ABDOMEN - 1 VIEW COMPARISON:  Earlier radiograph dated 04/07/2018 and CT of the abdomen pelvis dated 04/07/2018 FINDINGS: Partially visualized enteric tube in the upper abdomen likely in the stomach. There is passage of  contrast into the colon with contrast seen in the rectosigmoid. IMPRESSION: Oral contrast has passed into the large bowel. Electronically Signed   By: Anner Crete M.D.   On: 04/08/2018 02:52   Dg Abd Portable 1v-small Bowel Protocol-position Verification  Result Date: 04/07/2018 CLINICAL DATA:  Evaluate NG tube position EXAM: PORTABLE ABDOMEN - 1 VIEW COMPARISON:  CT abdomen pelvis of 04/07/2018 FINDINGS: The NG tube coils within the fundus of the stomach with the tip remaining within the fundus of the stomach. The bowel gas pattern is nonspecific. Contrast is being excreted by the kidneys with some opacification of the urinary bladder. Degenerative changes are noted throughout the lumbar spine. IMRESSION: NG tube coils in the fundus with the tip remaining in the fundus of the stomach. Electronically Signed   By: Ivar Drape M.D.   On: 04/07/2018 16:59    Micro Results      No results found for this or any previous visit (from the past 240 hour(s)).     Today   Subjective    Malika Demario today has been afebrile,  Pt denies abd pain, diarrhea, n/v, brbpr, black stool.  No headache,no chest pain,no new weakness tingling or numbness, feels much better wants to go home today.   Objective   Blood pressure (!) 146/78, pulse 80, temperature 98 F (36.7 C), temperature source Oral, resp. rate 17, height 5' (1.524 m), weight 95.3 kg (210 lb 1.6 oz), SpO2 95 %.   Intake/Output Summary (Last 24 hours) at 04/11/2018 0641 Last data filed at 04/10/2018 2010 Gross per 24 hour  Intake 1662.5 ml  Output -  Net 1662.5 ml    Exam Awake Alert, Oriented x 3, No new F.N deficits, Normal affect [place].AT,PERRAL Supple Neck,No JVD, No cervical lymphadenopathy appriciated.  Symmetrical Chest wall movement, Good air movement bilaterally, CTAB RRR,No Gallops,Rubs or new Murmurs, No Parasternal Heave +ve B.Sounds, Abd Soft, Non tender, No organomegaly appriciated, No rebound -guarding or  rigidity. No Cyanosis, Clubbing or edema, No new Rash or bruise + obeisity   Data Review  CBC w Diff:  Lab Results  Component Value Date   WBC 3.6 (L) 04/11/2018   HGB 11.6 (L) 04/11/2018   HCT 36.4 04/11/2018   PLT 192 04/11/2018   LYMPHOPCT 23.3 07/22/2017   MONOPCT 7.4 07/22/2017   EOSPCT 2.2 07/22/2017   BASOPCT 0.5 07/22/2017    CMP:  Lab Results  Component Value Date   NA 139 04/11/2018   K 3.6 04/11/2018   CL 110 04/11/2018   CO2 21 (L) 04/11/2018   BUN 11 04/11/2018   CREATININE 0.74 04/11/2018   PROT 6.2 (L) 04/11/2018   ALBUMIN 3.1 (L) 04/11/2018   BILITOT 0.5 04/11/2018   ALKPHOS 78 04/11/2018   AST 15 04/11/2018   ALT 11 (L) 04/11/2018  .   Total Time in preparing paper work, data evaluation and todays exam - 5 minutes  Jani Gravel M.D on 04/11/2018 at 6:41 AM  [facility]   Office  [phone]

## 2018-05-28 IMAGING — DX DG ABD PORTABLE 1V
2 series · 2 of 2 positions shown · IV contrast (agent unspecified)
Comparison: Earlier radiograph dated 04/07/2018 and CT of the
abdomen pelvis dated 04/07/2018

CLINICAL DATA: 76-year-old female with small bowel series, 8 hour
postcontrast image.

EXAM:
PORTABLE ABDOMEN - 1 VIEW

[abdomen kub (1 of 2)]
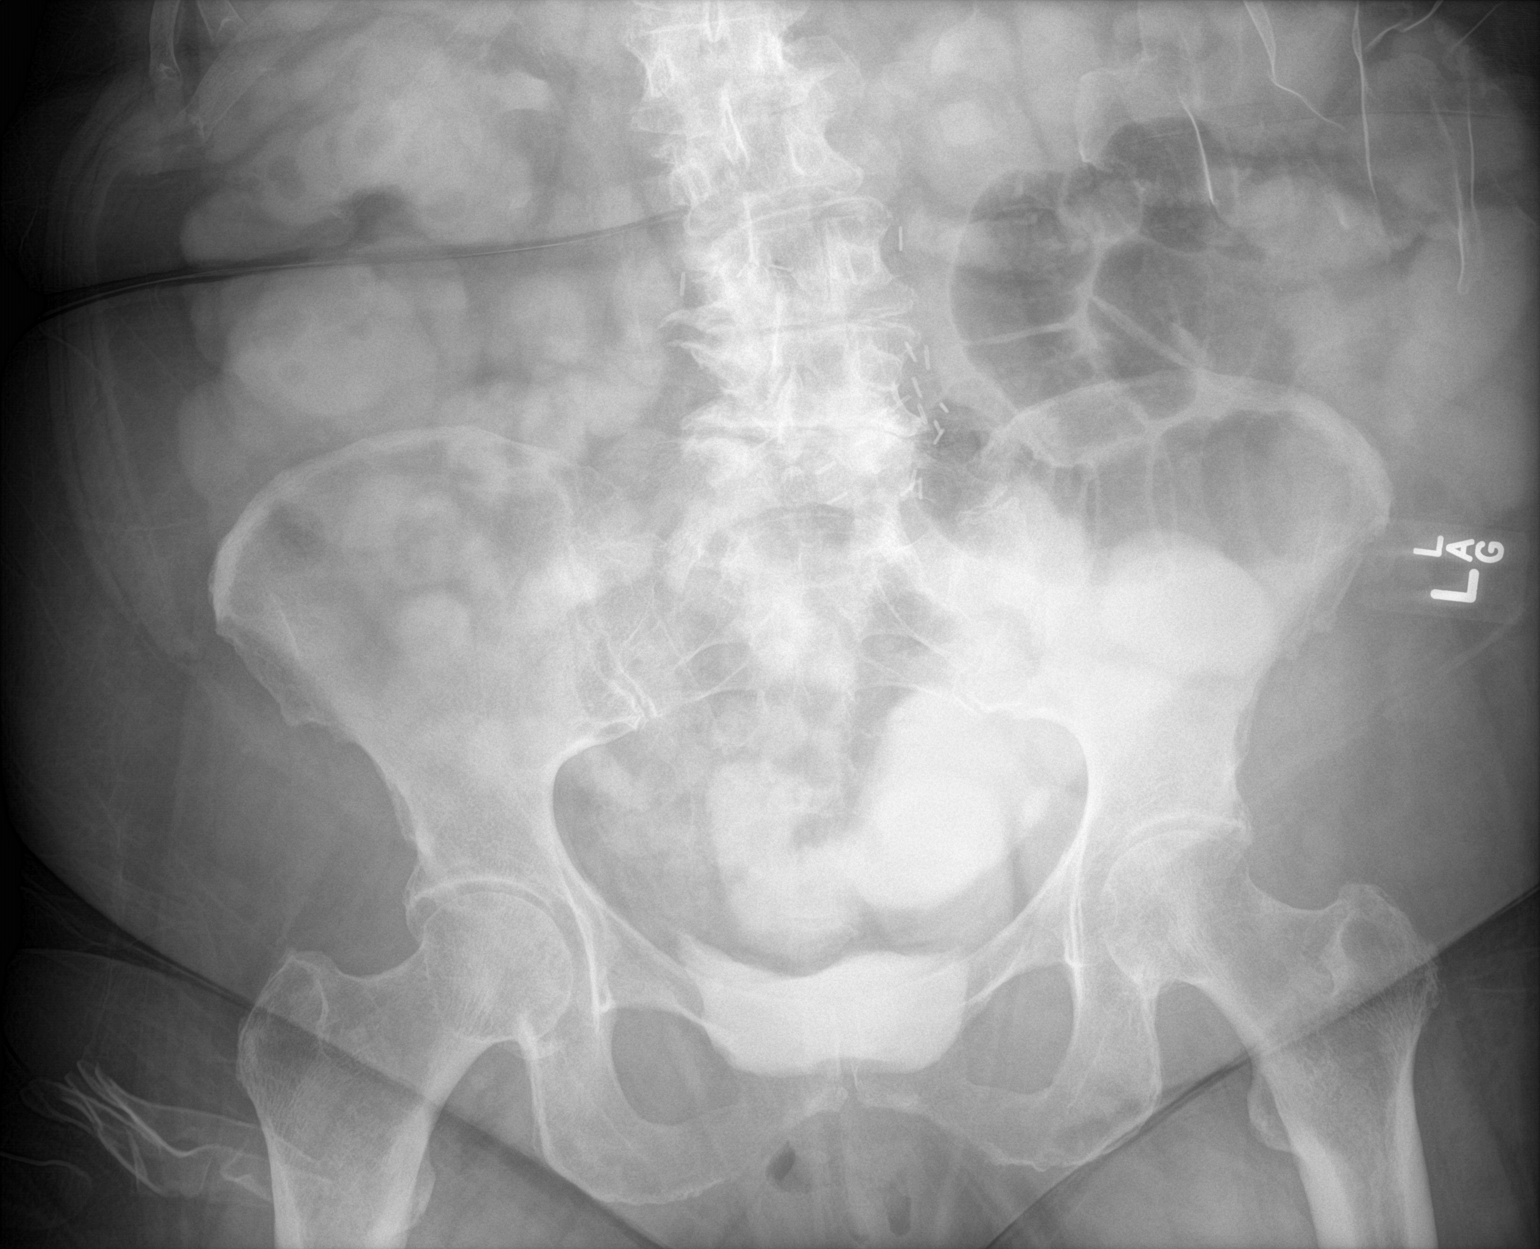

[abdomen kub (2 of 2)]
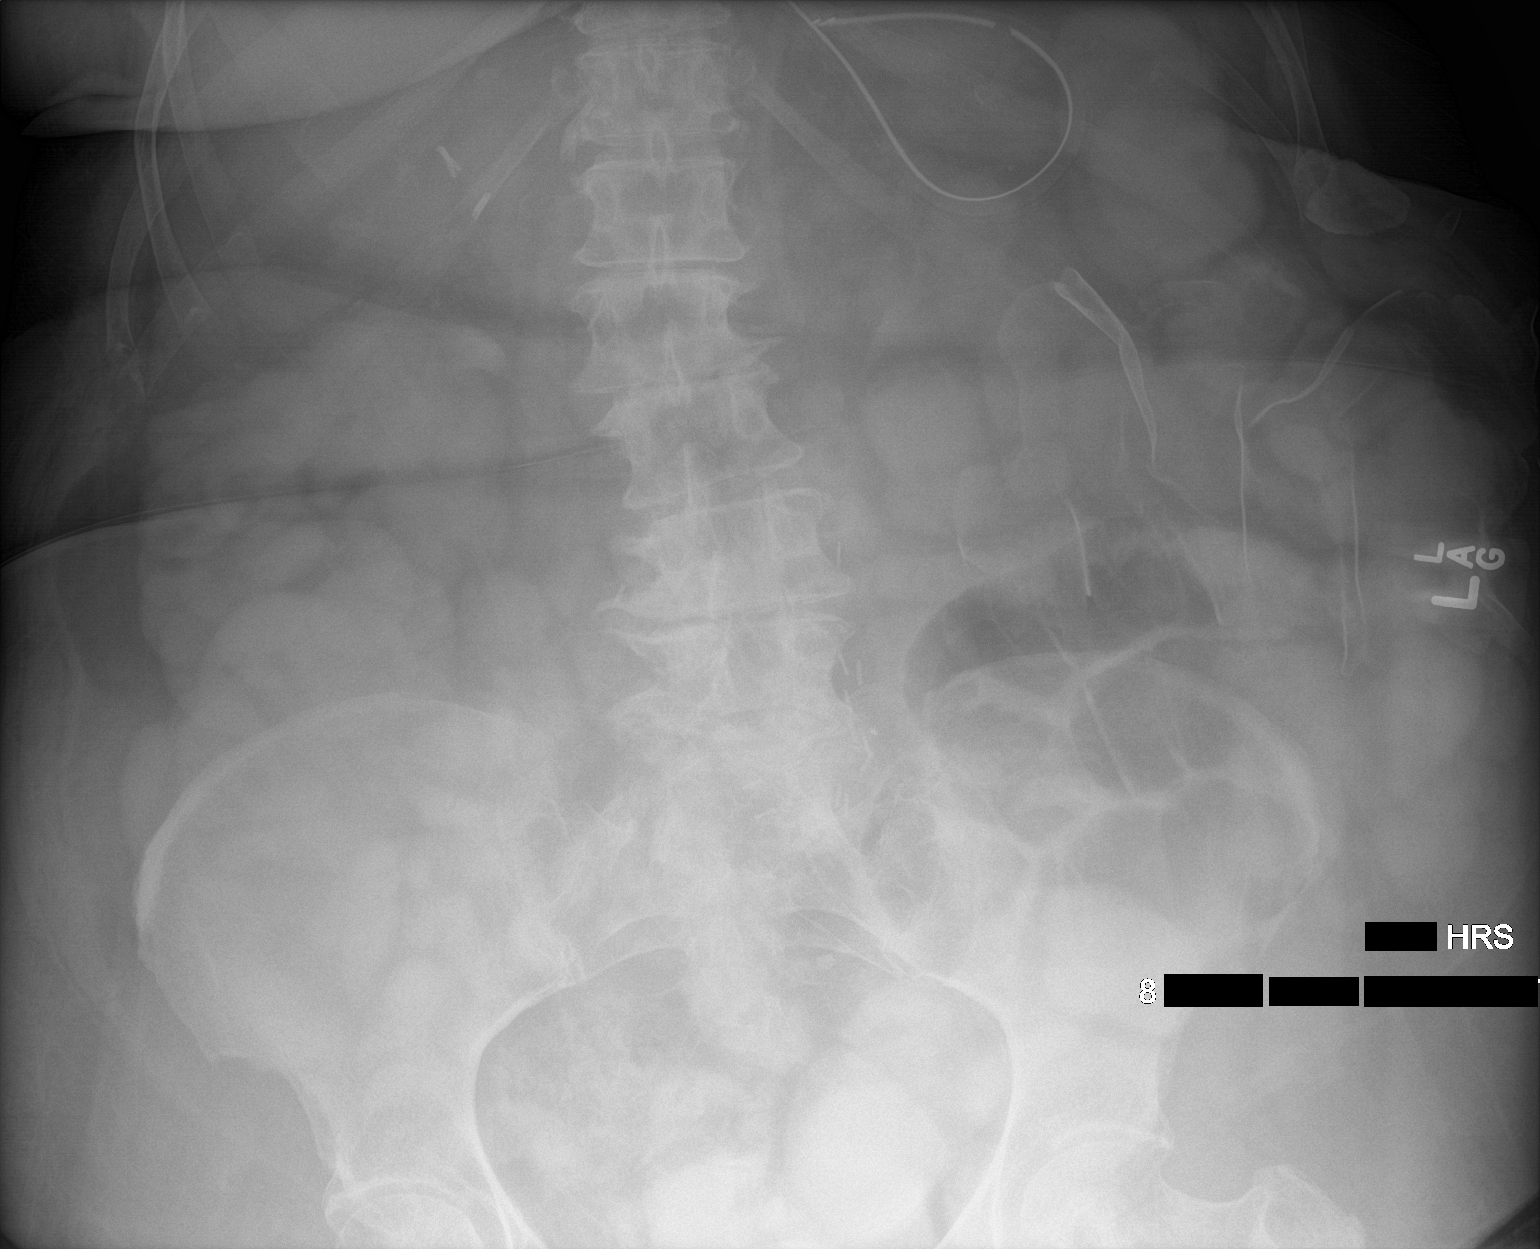

[2 of 2 positions shown; findings below may reference images not displayed]

FINDINGS: Partially visualized enteric tube in the upper abdomen likely in the
stomach. There is passage of contrast into the colon with contrast
seen in the rectosigmoid.
IMPRESSION: Oral contrast has passed into the large bowel.

## 2018-06-30 ENCOUNTER — Encounter: Payer: Self-pay | Admitting: *Deleted

## 2018-06-30 ENCOUNTER — Other Ambulatory Visit: Payer: Self-pay

## 2018-07-06 NOTE — Discharge Instructions (Signed)

## 2018-07-11 ENCOUNTER — Ambulatory Visit: Payer: Medicare Other | Admitting: Anesthesiology

## 2018-07-11 ENCOUNTER — Ambulatory Visit
Admission: RE | Admit: 2018-07-11 | Discharge: 2018-07-11 | Disposition: A | Discharge status: Home / Self Care | Payer: Medicare Other | Source: Ambulatory Visit | Attending: Ophthalmology | Admitting: Ophthalmology

## 2018-07-11 ENCOUNTER — Encounter
Admission: RE | Disposition: A | Discharge status: Home / Self Care | Payer: Self-pay | Source: Ambulatory Visit | Attending: Ophthalmology

## 2018-07-11 DIAGNOSIS — Z79899 Other long term (current) drug therapy: Secondary | ICD-10-CM | POA: Diagnosis not present

## 2018-07-11 DIAGNOSIS — Z8543 Personal history of malignant neoplasm of ovary: Secondary | ICD-10-CM | POA: Diagnosis not present

## 2018-07-11 DIAGNOSIS — E119 Type 2 diabetes mellitus without complications: Secondary | ICD-10-CM | POA: Insufficient documentation

## 2018-07-11 DIAGNOSIS — H2512 Age-related nuclear cataract, left eye: Secondary | ICD-10-CM | POA: Diagnosis present

## 2018-07-11 DIAGNOSIS — K219 Gastro-esophageal reflux disease without esophagitis: Secondary | ICD-10-CM | POA: Diagnosis not present

## 2018-07-11 DIAGNOSIS — E7800 Pure hypercholesterolemia, unspecified: Secondary | ICD-10-CM | POA: Insufficient documentation

## 2018-07-11 DIAGNOSIS — I10 Essential (primary) hypertension: Secondary | ICD-10-CM | POA: Diagnosis not present

## 2018-07-11 HISTORY — PX: CATARACT EXTRACTION W/PHACO: SHX586

## 2018-07-11 SURGERY — PHACOEMULSIFICATION, CATARACT, WITH IOL INSERTION
Anesthesia: Monitor Anesthesia Care | Site: Eye | Laterality: Left | Wound class: Clean

## 2018-07-11 MED ORDER — PHENYLEPHRINE HCL 10 % OP SOLN
1.0000 [drp] | OPHTHALMIC | Status: DC | PRN
  Administered 2018-07-11 (×3): 1 [drp] via OPHTHALMIC

## 2018-07-11 MED ORDER — CYCLOPENTOLATE HCL 2 % OP SOLN
1.0000 [drp] | OPHTHALMIC | Status: DC | PRN
  Administered 2018-07-11 (×3): 1 [drp] via OPHTHALMIC

## 2018-07-11 MED ORDER — SODIUM HYALURONATE 10 MG/ML IO SOLN
INTRAOCULAR | Status: DC | PRN
  Administered 2018-07-11: 0.55 mL via INTRAOCULAR

## 2018-07-11 MED ORDER — LIDOCAINE HCL (PF) 2 % IJ SOLN
INTRAMUSCULAR | Status: DC | PRN
  Administered 2018-07-11: 1 mL via INTRAOCULAR

## 2018-07-11 MED ORDER — LACTATED RINGERS IV SOLN: 10.0000 mL/h | INTRAVENOUS | Status: DC

## 2018-07-11 MED ORDER — MOXIFLOXACIN HCL 0.5 % OP SOLN
OPHTHALMIC | Status: DC | PRN
  Administered 2018-07-11: .1 mL via OPHTHALMIC

## 2018-07-11 MED ORDER — SODIUM HYALURONATE 23 MG/ML IO SOLN
INTRAOCULAR | Status: DC | PRN
  Administered 2018-07-11: 0.6 mL via INTRAOCULAR

## 2018-07-11 MED ORDER — ONDANSETRON HCL 4 MG/2ML IJ SOLN: 4.0000 mg | Freq: Once | INTRAMUSCULAR | Status: DC | PRN

## 2018-07-11 MED ORDER — FENTANYL CITRATE (PF) 100 MCG/2ML IJ SOLN
INTRAMUSCULAR | Status: DC | PRN
  Administered 2018-07-11: 50 ug via INTRAVENOUS

## 2018-07-11 MED ORDER — TETRACAINE HCL 0.5 % OP SOLN
1.0000 [drp] | OPHTHALMIC | Status: DC | PRN
  Administered 2018-07-11 (×2): 1 [drp] via OPHTHALMIC

## 2018-07-11 MED ORDER — EPINEPHRINE PF 1 MG/ML IJ SOLN
INTRAOCULAR | Status: DC | PRN
  Administered 2018-07-11: 92 mL via OPHTHALMIC

## 2018-07-11 MED ORDER — MIDAZOLAM HCL 2 MG/2ML IJ SOLN
INTRAMUSCULAR | Status: DC | PRN
  Administered 2018-07-11: 2 mg via INTRAVENOUS

## 2018-07-11 SURGICAL SUPPLY — 17 items
CANNULA ANT/CHMB 27G (MISCELLANEOUS) ×1 IMPLANT
CANNULA ANT/CHMB 27GA (MISCELLANEOUS) ×2 IMPLANT
DISSECTOR HYDRO NUCLEUS 50X22 (MISCELLANEOUS) ×2 IMPLANT
GLOVE BIO SURGEON STRL SZ8 (GLOVE) ×2 IMPLANT
GLOVE SURG LX 7.5 STRW (GLOVE) ×1
GLOVE SURG LX STRL 7.5 STRW (GLOVE) ×1 IMPLANT
GOWN STRL REUS W/ TWL LRG LVL3 (GOWN DISPOSABLE) ×2 IMPLANT
GOWN STRL REUS W/TWL LRG LVL3 (GOWN DISPOSABLE) ×4
LENS IOL TECNIS ITEC 21.5 (Intraocular Lens) ×1 IMPLANT
MARKER SKIN DUAL TIP RULER LAB (MISCELLANEOUS) ×2 IMPLANT
PACK CATARACT (MISCELLANEOUS) ×2 IMPLANT
PACK DR. KING ARMS (PACKS) ×2 IMPLANT
PACK EYE AFTER SURG (MISCELLANEOUS) ×2 IMPLANT
SYR 3ML LL SCALE MARK (SYRINGE) ×2 IMPLANT
SYR TB 1ML LUER SLIP (SYRINGE) ×2 IMPLANT
WATER STERILE IRR 500ML POUR (IV SOLUTION) ×2 IMPLANT
WIPE NON LINTING 3.25X3.25 (MISCELLANEOUS) ×2 IMPLANT

## 2018-07-11 NOTE — Op Note (Signed)
OPERATIVE NOTE  Tonya Miranda [id number] 07/11/2018   PREOPERATIVE DIAGNOSIS:  Nuclear sclerotic cataract left eye.  H25.12   POSTOPERATIVE DIAGNOSIS:    Nuclear sclerotic cataract left eye.     PROCEDURE:  Phacoemusification with posterior chamber intraocular lens placement of the left eye   LENS:   Implant Name Type Inv. Item Serial No. Manufacturer Lot No. LRB No. Used  LENS IOL DIOP 21.5 - [id number] Intraocular Lens LENS IOL DIOP 21.5 [id number] AMO  Left 1       PCB00 +21.5   ULTRASOUND TIME: 0 minutes 55 seconds.  CDE 8.38   SURGEON:  Benay Pillow, MD, MPH   ANESTHESIA:  Topical with tetracaine drops augmented with 1% preservative-free intracameral lidocaine.  ESTIMATED BLOOD LOSS: <1 mL   COMPLICATIONS:  None.   DESCRIPTION OF PROCEDURE:  The patient was identified in the holding room and transported to the operating room and placed in the supine position under the operating microscope.  The left eye was identified as the operative eye and it was prepped and draped in the usual sterile ophthalmic fashion.   A 1.0 millimeter clear-corneal paracentesis was made at the 5:00 position. 0.5 ml of preservative-free 1% lidocaine with epinephrine was injected into the anterior chamber.  The anterior chamber was filled with Healon 5 viscoelastic.  A 2.4 millimeter keratome was used to make a near-clear corneal incision at the 2:00 position.  A curvilinear capsulorrhexis was made with a cystotome and capsulorrhexis forceps.  Balanced salt solution was used to hydrodissect and hydrodelineate the nucleus.   Phacoemulsification was then used in stop and chop fashion to remove the lens nucleus and epinucleus.  The remaining cortex was then removed using the irrigation and aspiration handpiece. Healon was then placed into the capsular bag to distend it for lens placement.  A lens was then injected into the capsular bag.  The remaining viscoelastic was aspirated.   Wounds were  hydrated with balanced salt solution.  The anterior chamber was inflated to a physiologic pressure with balanced salt solution.  Intracameral vigamox 0.1 mL undiltued was injected into the eye and a drop placed onto the ocular surface.  No wound leaks were noted.  The patient was taken to the recovery room in stable condition without complications of anesthesia or surgery  Benay Pillow 07/11/2018, 10:36 AM

## 2018-07-11 NOTE — Transfer of Care (Signed)
Immediate Anesthesia Transfer of Care Note  Patient: Tonya Miranda  Procedure(s) Performed: CATARACT EXTRACTION PHACO AND INTRAOCULAR LENS PLACEMENT (IOC)  LEFT (Left Eye)  Patient Location: PACU  Anesthesia Type: MAC  Level of Consciousness: awake, alert  and patient cooperative  Airway and Oxygen Therapy: Patient Spontanous Breathing and Patient connected to supplemental oxygen  Post-op Assessment: Post-op Vital signs reviewed, Patient's Cardiovascular Status Stable, Respiratory Function Stable, Patent Airway and No signs of Nausea or vomiting  Post-op Vital Signs: Reviewed and stable  Complications: No apparent anesthesia complications

## 2018-07-11 NOTE — Anesthesia Preprocedure Evaluation (Addendum)
Anesthesia Evaluation  Patient identified by MRN, date of birth, ID band Patient awake    Reviewed: Allergy & Precautions, NPO status , Patient's Chart, lab work & pertinent test results  Airway Mallampati: I  TM Distance: >3 FB     Dental   Pulmonary    breath sounds clear to auscultation       Cardiovascular hypertension,  Rhythm:Regular Rate:Normal     Neuro/Psych  Headaches, Anxiety - claustrophobia with anything over face (from a traumatic event as a child)Hx craniotomy for tic douloureux    GI/Hepatic GERD  ,  Endo/Other  diabetesObesity - BMI 38  Renal/GU      Musculoskeletal  (+) Arthritis , Fibromyalgia -  Abdominal   Peds  Hematology   Anesthesia Other Findings   Reproductive/Obstetrics                            Anesthesia Physical Anesthesia Plan  ASA: III  Anesthesia Plan: MAC   Post-op Pain Management:    Induction:   PONV Risk Score and Plan:   Airway Management Planned: Nasal Cannula  Additional Equipment:   Intra-op Plan:   Post-operative Plan:   Informed Consent: I have reviewed the patients History and Physical, chart, labs and discussed the procedure including the risks, benefits and alternatives for the proposed anesthesia with the patient or authorized representative who has indicated his/her understanding and acceptance.     Plan Discussed with: CRNA  Anesthesia Plan Comments:         Anesthesia Quick Evaluation

## 2018-07-11 NOTE — H&P (Signed)
The History and Physical notes are on paper, have been signed, and are to be scanned.   I have examined the patient and there are no changes to the H&P.   Benay Pillow 07/11/2018 9:59 AM

## 2018-07-11 NOTE — Anesthesia Postprocedure Evaluation (Signed)
Anesthesia Post Note  Patient: Tonya Miranda  Procedure(s) Performed: CATARACT EXTRACTION PHACO AND INTRAOCULAR LENS PLACEMENT (IOC)  LEFT (Left Eye)  Patient location during evaluation: PACU Anesthesia Type: MAC Level of consciousness: awake and alert Pain management: pain level controlled Vital Signs Assessment: post-procedure vital signs reviewed and stable Respiratory status: spontaneous breathing, nonlabored ventilation, respiratory function stable and patient connected to nasal cannula oxygen Cardiovascular status: stable and blood pressure returned to baseline Postop Assessment: no apparent nausea or vomiting Anesthetic complications: no    Veda Canning

## 2018-07-11 NOTE — Anesthesia Procedure Notes (Signed)
Procedure Name: MAC Date/Time: 07/11/2018 10:12 AM Performed by: Janna Arch, CRNA Pre-anesthesia Checklist: Patient identified, Emergency Drugs available, Suction available and Patient being monitored Patient Re-evaluated:Patient Re-evaluated prior to induction Oxygen Delivery Method: Nasal cannula

## 2018-08-11 ENCOUNTER — Other Ambulatory Visit: Payer: Self-pay

## 2018-08-11 ENCOUNTER — Encounter: Payer: Self-pay | Admitting: *Deleted

## 2018-08-18 NOTE — Discharge Instructions (Signed)

## 2018-08-19 NOTE — Anesthesia Preprocedure Evaluation (Addendum)
Anesthesia Evaluation  Patient identified by MRN, date of birth, ID band Patient awake    Reviewed: Allergy & Precautions, NPO status , Patient's Chart, lab work & pertinent test results  History of Anesthesia Complications (+) PONV and history of anesthetic complications  Airway Mallampati: III  TM Distance: >3 FB     Dental  (+)    Pulmonary  Snoring    Pulmonary exam normal breath sounds clear to auscultation       Cardiovascular Exercise Tolerance: Good hypertension, Normal cardiovascular exam Rhythm:Regular Rate:Normal     Neuro/Psych  Headaches, PSYCHIATRIC DISORDERS (claustrophobia) Anxiety Anxiety - claustrophobia with anything over face (from a traumatic event as a child)Hx craniotomy for tic douloureux  Neuromuscular disease (fibromyalgia)    GI/Hepatic GERD  ,  Endo/Other  diabetesObesity - BMI 38  Renal/GU negative Renal ROS     Musculoskeletal  (+) Arthritis , Fibromyalgia -  Abdominal   Peds  Hematology negative hematology ROS (+)   Anesthesia Other Findings   Reproductive/Obstetrics                            Anesthesia Physical  Anesthesia Plan  ASA: III  Anesthesia Plan: MAC   Post-op Pain Management:    Induction: Intravenous  PONV Risk Score and Plan: 3 and TIVA and Midazolam  Airway Management Planned: Natural Airway  Additional Equipment:   Intra-op Plan:   Post-operative Plan:   Informed Consent: I have reviewed the patients History and Physical, chart, labs and discussed the procedure including the risks, benefits and alternatives for the proposed anesthesia with the patient or authorized representative who has indicated his/her understanding and acceptance.     Plan Discussed with: CRNA  Anesthesia Plan Comments:        Anesthesia Quick Evaluation

## 2018-08-22 ENCOUNTER — Encounter
Admission: RE | Disposition: A | Discharge status: Home / Self Care | Payer: Self-pay | Source: Ambulatory Visit | Attending: Ophthalmology

## 2018-08-22 ENCOUNTER — Ambulatory Visit: Payer: Medicare Other | Admitting: Anesthesiology

## 2018-08-22 ENCOUNTER — Ambulatory Visit
Admission: RE | Admit: 2018-08-22 | Discharge: 2018-08-22 | Disposition: A | Discharge status: Home / Self Care | Payer: Medicare Other | Source: Ambulatory Visit | Attending: Ophthalmology | Admitting: Ophthalmology

## 2018-08-22 DIAGNOSIS — M1990 Unspecified osteoarthritis, unspecified site: Secondary | ICD-10-CM | POA: Insufficient documentation

## 2018-08-22 DIAGNOSIS — M797 Fibromyalgia: Secondary | ICD-10-CM | POA: Insufficient documentation

## 2018-08-22 DIAGNOSIS — Z96659 Presence of unspecified artificial knee joint: Secondary | ICD-10-CM | POA: Insufficient documentation

## 2018-08-22 DIAGNOSIS — I10 Essential (primary) hypertension: Secondary | ICD-10-CM | POA: Diagnosis not present

## 2018-08-22 DIAGNOSIS — E669 Obesity, unspecified: Secondary | ICD-10-CM | POA: Insufficient documentation

## 2018-08-22 DIAGNOSIS — Z79899 Other long term (current) drug therapy: Secondary | ICD-10-CM | POA: Diagnosis not present

## 2018-08-22 DIAGNOSIS — Z8543 Personal history of malignant neoplasm of ovary: Secondary | ICD-10-CM | POA: Insufficient documentation

## 2018-08-22 DIAGNOSIS — K219 Gastro-esophageal reflux disease without esophagitis: Secondary | ICD-10-CM | POA: Diagnosis not present

## 2018-08-22 DIAGNOSIS — Z6838 Body mass index (BMI) 38.0-38.9, adult: Secondary | ICD-10-CM | POA: Insufficient documentation

## 2018-08-22 DIAGNOSIS — H2511 Age-related nuclear cataract, right eye: Secondary | ICD-10-CM | POA: Insufficient documentation

## 2018-08-22 DIAGNOSIS — Z7951 Long term (current) use of inhaled steroids: Secondary | ICD-10-CM | POA: Diagnosis not present

## 2018-08-22 DIAGNOSIS — E1136 Type 2 diabetes mellitus with diabetic cataract: Secondary | ICD-10-CM | POA: Diagnosis not present

## 2018-08-22 HISTORY — PX: CATARACT EXTRACTION W/PHACO: SHX586

## 2018-08-22 SURGERY — PHACOEMULSIFICATION, CATARACT, WITH IOL INSERTION
Anesthesia: Monitor Anesthesia Care | Site: Eye | Laterality: Right

## 2018-08-22 MED ORDER — FENTANYL CITRATE (PF) 100 MCG/2ML IJ SOLN
INTRAMUSCULAR | Status: DC | PRN
  Administered 2018-08-22 (×2): 50 ug via INTRAVENOUS

## 2018-08-22 MED ORDER — MOXIFLOXACIN HCL 0.5 % OP SOLN
OPHTHALMIC | Status: DC | PRN
  Administered 2018-08-22: 0.2 mL via OPHTHALMIC

## 2018-08-22 MED ORDER — EPINEPHRINE PF 1 MG/ML IJ SOLN
INTRAOCULAR | Status: DC | PRN
  Administered 2018-08-22: 95 mL via OPHTHALMIC

## 2018-08-22 MED ORDER — LACTATED RINGERS IV SOLN: INTRAVENOUS | Status: DC

## 2018-08-22 MED ORDER — ACETAMINOPHEN 325 MG PO TABS: 650.0000 mg | ORAL_TABLET | Freq: Once | ORAL | Status: DC | PRN

## 2018-08-22 MED ORDER — ONDANSETRON HCL 4 MG/2ML IJ SOLN
4.0000 mg | Freq: Once | INTRAMUSCULAR | Status: AC | PRN
  Administered 2018-08-22: 4 mg via INTRAVENOUS

## 2018-08-22 MED ORDER — LIDOCAINE HCL (PF) 2 % IJ SOLN
INTRAOCULAR | Status: DC | PRN
  Administered 2018-08-22: 1 mL via INTRAOCULAR

## 2018-08-22 MED ORDER — MIDAZOLAM HCL 2 MG/2ML IJ SOLN
INTRAMUSCULAR | Status: DC | PRN
  Administered 2018-08-22: 1 mg via INTRAVENOUS
  Administered 2018-08-22: 2 mg via INTRAVENOUS

## 2018-08-22 MED ORDER — ACETAMINOPHEN 160 MG/5ML PO SOLN: 325.0000 mg | ORAL | Status: DC | PRN

## 2018-08-22 MED ORDER — SODIUM HYALURONATE 10 MG/ML IO SOLN
INTRAOCULAR | Status: DC | PRN
  Administered 2018-08-22: 0.55 mL via INTRAOCULAR

## 2018-08-22 MED ORDER — ARMC OPHTHALMIC DILATING DROPS
1.0000 "application " | OPHTHALMIC | Status: DC | PRN
  Administered 2018-08-22 (×3): 1 via OPHTHALMIC

## 2018-08-22 MED ORDER — SODIUM HYALURONATE 23 MG/ML IO SOLN
INTRAOCULAR | Status: DC | PRN
  Administered 2018-08-22: 0.6 mL via INTRAOCULAR

## 2018-08-22 SURGICAL SUPPLY — 17 items
CANNULA ANT/CHMB 27G (MISCELLANEOUS) ×1 IMPLANT
CANNULA ANT/CHMB 27GA (MISCELLANEOUS) ×3 IMPLANT
DISSECTOR HYDRO NUCLEUS 50X22 (MISCELLANEOUS) ×3 IMPLANT
GLOVE BIO SURGEON STRL SZ8 (GLOVE) ×3 IMPLANT
GLOVE SURG LX 7.5 STRW (GLOVE) ×2
GLOVE SURG LX STRL 7.5 STRW (GLOVE) ×1 IMPLANT
GOWN STRL REUS W/ TWL LRG LVL3 (GOWN DISPOSABLE) ×2 IMPLANT
GOWN STRL REUS W/TWL LRG LVL3 (GOWN DISPOSABLE) ×6
LENS IOL TECNIS ITEC 22.5 (Intraocular Lens) ×2 IMPLANT
MARKER SKIN DUAL TIP RULER LAB (MISCELLANEOUS) ×3 IMPLANT
PACK DR. KING ARMS (PACKS) ×3 IMPLANT
PACK EYE AFTER SURG (MISCELLANEOUS) ×3 IMPLANT
PACK OPTHALMIC (MISCELLANEOUS) ×3 IMPLANT
SYR 3ML LL SCALE MARK (SYRINGE) ×3 IMPLANT
SYR TB 1ML LUER SLIP (SYRINGE) ×3 IMPLANT
WATER STERILE IRR 500ML POUR (IV SOLUTION) ×3 IMPLANT
WIPE NON LINTING 3.25X3.25 (MISCELLANEOUS) ×3 IMPLANT

## 2018-08-22 NOTE — Op Note (Signed)
OPERATIVE NOTE  Tonya Miranda [id number] 08/22/2018   PREOPERATIVE DIAGNOSIS:  Nuclear sclerotic cataract right eye.  H25.11   POSTOPERATIVE DIAGNOSIS:    Nuclear sclerotic cataract right eye.     PROCEDURE:  Phacoemusification with posterior chamber intraocular lens placement of the right eye   LENS:   Implant Name Type Inv. Item Serial No. Manufacturer Lot No. LRB No. Used  LENS IOL DIOP 22.5 - [id number] Intraocular Lens LENS IOL DIOP 22.5 [id number] AMO  Right 1       PCB00 +22.5   ULTRASOUND TIME: 0 minutes 53 seconds.  CDE 5.37   SURGEON:  Benay Pillow, MD, MPH  ANESTHESIOLOGIST: Anesthesiologist: Darrin Nipper, MD CRNA: Georga Bora, CRNA   ANESTHESIA:  Topical with tetracaine drops augmented with 1% preservative-free intracameral lidocaine.  ESTIMATED BLOOD LOSS: less than 1 mL.   COMPLICATIONS:  None.   DESCRIPTION OF PROCEDURE:  The patient was identified in the holding room and transported to the operating room and placed in the supine position under the operating microscope.  The right eye was identified as the operative eye and it was prepped and draped in the usual sterile ophthalmic fashion.   A 1.0 millimeter clear-corneal paracentesis was made at the 10:30 position. 0.5 ml of preservative-free 1% lidocaine with epinephrine was injected into the anterior chamber.  The anterior chamber was filled with Healon 5 viscoelastic.  A 2.4 millimeter keratome was used to make a near-clear corneal incision at the 8:00 position.  A curvilinear capsulorrhexis was made with a cystotome and capsulorrhexis forceps.  Balanced salt solution was used to hydrodissect and hydrodelineate the nucleus.   Phacoemulsification was then used in stop and chop fashion to remove the lens nucleus and epinucleus.  The remaining cortex was then removed using the irrigation and aspiration handpiece. Healon was then placed into the capsular bag to distend it for lens placement.  A lens was  then injected into the capsular bag.  The remaining viscoelastic was aspirated.   Wounds were hydrated with balanced salt solution.  The anterior chamber was inflated to a physiologic pressure with balanced salt solution.   Intracameral vigamox 0.1 mL undiluted was injected into the eye and a drop placed onto the ocular surface.  No wound leaks were noted.  The patient was taken to the recovery room in stable condition without complications of anesthesia or surgery  Benay Pillow 08/22/2018, 10:09 AM

## 2018-08-22 NOTE — Transfer of Care (Signed)
Immediate Anesthesia Transfer of Care Note  Patient: Tonya Miranda  Procedure(s) Performed: CATARACT EXTRACTION PHACO AND INTRAOCULAR LENS PLACEMENT (IOC) RIGHT (Right Eye)  Patient Location: PACU  Anesthesia Type: MAC  Level of Consciousness: awake, alert  and patient cooperative  Airway and Oxygen Therapy: Patient Spontanous Breathing and Patient connected to supplemental oxygen  Post-op Assessment: Post-op Vital signs reviewed, Patient's Cardiovascular Status Stable, Respiratory Function Stable, Patent Airway and No signs of Nausea or vomiting  Post-op Vital Signs: Reviewed and stable  Complications: No apparent anesthesia complications

## 2018-08-22 NOTE — H&P (Signed)
The History and Physical notes are on paper, have been signed, and are to be scanned.   I have examined the patient and there are no changes to the H&P.   Benay Pillow 08/22/2018 9:32 AM

## 2018-08-22 NOTE — Anesthesia Postprocedure Evaluation (Signed)
Anesthesia Post Note  Patient: Tonya Miranda  Procedure(s) Performed: CATARACT EXTRACTION PHACO AND INTRAOCULAR LENS PLACEMENT (IOC) RIGHT (Right Eye)  Patient location during evaluation: PACU Anesthesia Type: MAC Level of consciousness: awake and alert, oriented and patient cooperative Pain management: pain level controlled Vital Signs Assessment: post-procedure vital signs reviewed and stable Respiratory status: spontaneous breathing, nonlabored ventilation and respiratory function stable Cardiovascular status: blood pressure returned to baseline and stable Postop Assessment: adequate PO intake Anesthetic complications: no    Darrin Nipper

## 2018-08-23 ENCOUNTER — Encounter: Payer: Self-pay | Admitting: Ophthalmology

## 2018-09-28 ENCOUNTER — Inpatient Hospital Stay (HOSPITAL_COMMUNITY)
Admission: EM | Admit: 2018-09-28 | Discharge: 2018-10-01 | DRG: 871 | Disposition: A | Discharge status: Home / Self Care | Payer: Medicare Other | Attending: Family Medicine | Admitting: Family Medicine

## 2018-09-28 DIAGNOSIS — G500 Trigeminal neuralgia: Secondary | ICD-10-CM | POA: Diagnosis present

## 2018-09-28 DIAGNOSIS — M797 Fibromyalgia: Secondary | ICD-10-CM | POA: Diagnosis present

## 2018-09-28 DIAGNOSIS — A4159 Other Gram-negative sepsis: Secondary | ICD-10-CM | POA: Diagnosis not present

## 2018-09-28 DIAGNOSIS — R0902 Hypoxemia: Secondary | ICD-10-CM | POA: Diagnosis not present

## 2018-09-28 DIAGNOSIS — I444 Left anterior fascicular block: Secondary | ICD-10-CM | POA: Diagnosis present

## 2018-09-28 DIAGNOSIS — J9601 Acute respiratory failure with hypoxia: Secondary | ICD-10-CM | POA: Diagnosis present

## 2018-09-28 DIAGNOSIS — Z90710 Acquired absence of both cervix and uterus: Secondary | ICD-10-CM

## 2018-09-28 DIAGNOSIS — Z791 Long term (current) use of non-steroidal anti-inflammatories (NSAID): Secondary | ICD-10-CM

## 2018-09-28 DIAGNOSIS — Z7951 Long term (current) use of inhaled steroids: Secondary | ICD-10-CM

## 2018-09-28 DIAGNOSIS — I10 Essential (primary) hypertension: Secondary | ICD-10-CM | POA: Diagnosis present

## 2018-09-28 DIAGNOSIS — D649 Anemia, unspecified: Secondary | ICD-10-CM | POA: Diagnosis present

## 2018-09-28 DIAGNOSIS — J181 Lobar pneumonia, unspecified organism: Secondary | ICD-10-CM | POA: Diagnosis present

## 2018-09-28 DIAGNOSIS — Z8543 Personal history of malignant neoplasm of ovary: Secondary | ICD-10-CM

## 2018-09-28 DIAGNOSIS — E119 Type 2 diabetes mellitus without complications: Secondary | ICD-10-CM

## 2018-09-28 DIAGNOSIS — A419 Sepsis, unspecified organism: Secondary | ICD-10-CM | POA: Diagnosis present

## 2018-09-28 DIAGNOSIS — K219 Gastro-esophageal reflux disease without esophagitis: Secondary | ICD-10-CM | POA: Diagnosis present

## 2018-09-28 DIAGNOSIS — J189 Pneumonia, unspecified organism: Secondary | ICD-10-CM

## 2018-09-28 DIAGNOSIS — R748 Abnormal levels of other serum enzymes: Secondary | ICD-10-CM | POA: Diagnosis present

## 2018-09-28 DIAGNOSIS — Z885 Allergy status to narcotic agent status: Secondary | ICD-10-CM

## 2018-09-28 DIAGNOSIS — R6520 Severe sepsis without septic shock: Secondary | ICD-10-CM | POA: Diagnosis present

## 2018-09-28 DIAGNOSIS — A481 Legionnaires' disease: Secondary | ICD-10-CM

## 2018-09-28 DIAGNOSIS — Z91040 Latex allergy status: Secondary | ICD-10-CM

## 2018-09-28 DIAGNOSIS — Z79899 Other long term (current) drug therapy: Secondary | ICD-10-CM

## 2018-09-28 DIAGNOSIS — Z8679 Personal history of other diseases of the circulatory system: Secondary | ICD-10-CM

## 2018-09-29 ENCOUNTER — Other Ambulatory Visit: Payer: Self-pay

## 2018-09-29 ENCOUNTER — Emergency Department (HOSPITAL_COMMUNITY): Payer: Medicare Other

## 2018-09-29 ENCOUNTER — Encounter (HOSPITAL_COMMUNITY): Payer: Self-pay | Admitting: Cardiology

## 2018-09-29 DIAGNOSIS — K219 Gastro-esophageal reflux disease without esophagitis: Secondary | ICD-10-CM | POA: Diagnosis present

## 2018-09-29 DIAGNOSIS — Z8543 Personal history of malignant neoplasm of ovary: Secondary | ICD-10-CM | POA: Diagnosis not present

## 2018-09-29 DIAGNOSIS — Z791 Long term (current) use of non-steroidal anti-inflammatories (NSAID): Secondary | ICD-10-CM | POA: Diagnosis not present

## 2018-09-29 DIAGNOSIS — J189 Pneumonia, unspecified organism: Secondary | ICD-10-CM | POA: Insufficient documentation

## 2018-09-29 DIAGNOSIS — A419 Sepsis, unspecified organism: Secondary | ICD-10-CM | POA: Diagnosis present

## 2018-09-29 DIAGNOSIS — J9601 Acute respiratory failure with hypoxia: Secondary | ICD-10-CM | POA: Diagnosis present

## 2018-09-29 DIAGNOSIS — I444 Left anterior fascicular block: Secondary | ICD-10-CM | POA: Diagnosis present

## 2018-09-29 DIAGNOSIS — R0902 Hypoxemia: Secondary | ICD-10-CM | POA: Diagnosis present

## 2018-09-29 DIAGNOSIS — I10 Essential (primary) hypertension: Secondary | ICD-10-CM | POA: Diagnosis present

## 2018-09-29 DIAGNOSIS — Z8679 Personal history of other diseases of the circulatory system: Secondary | ICD-10-CM | POA: Diagnosis not present

## 2018-09-29 DIAGNOSIS — Z7951 Long term (current) use of inhaled steroids: Secondary | ICD-10-CM | POA: Diagnosis not present

## 2018-09-29 DIAGNOSIS — D649 Anemia, unspecified: Secondary | ICD-10-CM | POA: Diagnosis present

## 2018-09-29 DIAGNOSIS — G500 Trigeminal neuralgia: Secondary | ICD-10-CM | POA: Diagnosis present

## 2018-09-29 DIAGNOSIS — Z90710 Acquired absence of both cervix and uterus: Secondary | ICD-10-CM | POA: Diagnosis not present

## 2018-09-29 DIAGNOSIS — M797 Fibromyalgia: Secondary | ICD-10-CM | POA: Diagnosis present

## 2018-09-29 DIAGNOSIS — A481 Legionnaires' disease: Secondary | ICD-10-CM | POA: Diagnosis present

## 2018-09-29 DIAGNOSIS — R6520 Severe sepsis without septic shock: Secondary | ICD-10-CM | POA: Diagnosis present

## 2018-09-29 DIAGNOSIS — Z91040 Latex allergy status: Secondary | ICD-10-CM | POA: Diagnosis not present

## 2018-09-29 DIAGNOSIS — R748 Abnormal levels of other serum enzymes: Secondary | ICD-10-CM | POA: Diagnosis present

## 2018-09-29 DIAGNOSIS — A4159 Other Gram-negative sepsis: Secondary | ICD-10-CM | POA: Diagnosis present

## 2018-09-29 DIAGNOSIS — J181 Lobar pneumonia, unspecified organism: Secondary | ICD-10-CM | POA: Diagnosis not present

## 2018-09-29 DIAGNOSIS — E119 Type 2 diabetes mellitus without complications: Secondary | ICD-10-CM

## 2018-09-29 DIAGNOSIS — Z885 Allergy status to narcotic agent status: Secondary | ICD-10-CM | POA: Diagnosis not present

## 2018-09-29 DIAGNOSIS — Z79899 Other long term (current) drug therapy: Secondary | ICD-10-CM | POA: Diagnosis not present

## 2018-09-29 LAB — HEPATIC FUNCTION PANEL
ALBUMIN: 2.7 g/dL — AB (ref 3.5–5.0)
ALT: 18 U/L (ref 0–44)
AST: 19 U/L (ref 15–41)
Alkaline Phosphatase: 150 U/L — ABNORMAL HIGH (ref 38–126)
BILIRUBIN INDIRECT: 0.5 mg/dL (ref 0.3–0.9)
BILIRUBIN TOTAL: 0.8 mg/dL (ref 0.3–1.2)
Bilirubin, Direct: 0.3 mg/dL — ABNORMAL HIGH (ref 0.0–0.2)
TOTAL PROTEIN: 6.3 g/dL — AB (ref 6.5–8.1)

## 2018-09-29 LAB — CBC
HCT: 31.6 % — ABNORMAL LOW (ref 36.0–46.0)
HEMOGLOBIN: 9.8 g/dL — AB (ref 12.0–15.0)
MCH: 26.8 pg (ref 26.0–34.0)
MCHC: 31 g/dL (ref 30.0–36.0)
MCV: 86.6 fL (ref 80.0–100.0)
NRBC: 0 % (ref 0.0–0.2)
Platelets: 234 10*3/uL (ref 150–400)
RBC: 3.65 MIL/uL — AB (ref 3.87–5.11)
RDW: 16.4 % — ABNORMAL HIGH (ref 11.5–15.5)
WBC: 11.1 10*3/uL — AB (ref 4.0–10.5)

## 2018-09-29 LAB — INFLUENZA PANEL BY PCR (TYPE A & B)
INFLAPCR: NEGATIVE
INFLBPCR: NEGATIVE

## 2018-09-29 LAB — BASIC METABOLIC PANEL
ANION GAP: 8 (ref 5–15)
BUN: 15 mg/dL (ref 8–23)
CO2: 22 mmol/L (ref 22–32)
Calcium: 8.3 mg/dL — ABNORMAL LOW (ref 8.9–10.3)
Chloride: 103 mmol/L (ref 98–111)
Creatinine, Ser: 1.01 mg/dL — ABNORMAL HIGH (ref 0.44–1.00)
GFR, EST NON AFRICAN AMERICAN: 52 mL/min — AB (ref >60)
Glucose, Bld: 174 mg/dL — ABNORMAL HIGH (ref 70–99)
Potassium: 4 mmol/L (ref 3.5–5.1)
Sodium: 133 mmol/L — ABNORMAL LOW (ref 135–145)

## 2018-09-29 LAB — URINALYSIS, ROUTINE W REFLEX MICROSCOPIC
Bilirubin Urine: NEGATIVE
GLUCOSE, UA: NEGATIVE mg/dL
Hgb urine dipstick: NEGATIVE
KETONES UR: 5 mg/dL — AB
LEUKOCYTES UA: NEGATIVE
NITRITE: NEGATIVE
PROTEIN: NEGATIVE mg/dL
Specific Gravity, Urine: 1.005 (ref 1.005–1.030)
pH: 7 (ref 5.0–8.0)

## 2018-09-29 LAB — STREP PNEUMONIAE URINARY ANTIGEN: Strep Pneumo Urinary Antigen: NEGATIVE

## 2018-09-29 LAB — HIV ANTIBODY (ROUTINE TESTING W REFLEX): HIV Screen 4th Generation wRfx: NONREACTIVE

## 2018-09-29 LAB — I-STAT CG4 LACTIC ACID, ED: LACTIC ACID, VENOUS: 0.82 mmol/L (ref 0.5–1.9)

## 2018-09-29 LAB — PROCALCITONIN: Procalcitonin: 0.33 ng/mL

## 2018-09-29 LAB — I-STAT TROPONIN, ED: TROPONIN I, POC: 0.04 ng/mL (ref 0.00–0.08)

## 2018-09-29 LAB — LIPASE, BLOOD: Lipase: 23 U/L (ref 11–51)

## 2018-09-29 LAB — GLUCOSE, CAPILLARY: Glucose-Capillary: 124 mg/dL — ABNORMAL HIGH (ref 70–99)

## 2018-09-29 MED ORDER — POLYETHYLENE GLYCOL 3350 17 G PO PACK: 17.0000 g | PACK | Freq: Every day | ORAL | Status: DC | PRN

## 2018-09-29 MED ORDER — PANTOPRAZOLE SODIUM 40 MG PO TBEC
40.0000 mg | DELAYED_RELEASE_TABLET | Freq: Two times a day (BID) | ORAL | Status: DC
  Administered 2018-09-29 – 2018-10-01 (×5): 40 mg via ORAL
  Filled 2018-09-29 (×5): qty 1

## 2018-09-29 MED ORDER — LEVALBUTEROL HCL 1.25 MG/0.5ML IN NEBU: 1.2500 mg | INHALATION_SOLUTION | Freq: Four times a day (QID) | RESPIRATORY_TRACT | Status: DC | PRN

## 2018-09-29 MED ORDER — FLUTICASONE PROPIONATE 50 MCG/ACT NA SUSP
2.0000 | Freq: Every day | NASAL | Status: DC
  Administered 2018-09-29: 2 via NASAL
  Filled 2018-09-29: qty 16

## 2018-09-29 MED ORDER — ADULT MULTIVITAMIN W/MINERALS CH
1.0000 | ORAL_TABLET | Freq: Every day | ORAL | Status: DC
  Administered 2018-09-29 – 2018-10-01 (×3): 1 via ORAL
  Filled 2018-09-29 (×3): qty 1

## 2018-09-29 MED ORDER — SODIUM CHLORIDE 0.9 % IV SOLN
2.0000 g | INTRAVENOUS | Status: DC
  Administered 2018-09-29 – 2018-10-01 (×3): 2 g via INTRAVENOUS
  Filled 2018-09-29 (×3): qty 20

## 2018-09-29 MED ORDER — DIPHENHYDRAMINE HCL 25 MG PO CAPS: 25.0000 mg | ORAL_CAPSULE | Freq: Four times a day (QID) | ORAL | Status: DC | PRN

## 2018-09-29 MED ORDER — SODIUM CHLORIDE 0.9 % IV BOLUS (SEPSIS)
1000.0000 mL | Freq: Once | INTRAVENOUS | Status: AC
  Administered 2018-09-29: 1000 mL via INTRAVENOUS

## 2018-09-29 MED ORDER — LEVALBUTEROL HCL 1.25 MG/0.5ML IN NEBU
1.2500 mg | INHALATION_SOLUTION | Freq: Four times a day (QID) | RESPIRATORY_TRACT | Status: DC
  Administered 2018-09-29 (×3): 1.25 mg via RESPIRATORY_TRACT
  Filled 2018-09-29 (×3): qty 0.5

## 2018-09-29 MED ORDER — ONDANSETRON HCL 4 MG/2ML IJ SOLN: 4.0000 mg | Freq: Three times a day (TID) | INTRAMUSCULAR | Status: DC | PRN

## 2018-09-29 MED ORDER — SODIUM CHLORIDE 0.9 % IV SOLN
500.0000 mg | INTRAVENOUS | Status: DC
  Administered 2018-09-29 – 2018-10-01 (×3): 500 mg via INTRAVENOUS
  Filled 2018-09-29 (×3): qty 500

## 2018-09-29 MED ORDER — SODIUM CHLORIDE 0.9 % IV BOLUS (SEPSIS)
2000.0000 mL | Freq: Once | INTRAVENOUS | Status: AC
  Administered 2018-09-29: 2000 mL via INTRAVENOUS

## 2018-09-29 MED ORDER — PANTOPRAZOLE SODIUM 40 MG PO TBEC: 40.0000 mg | DELAYED_RELEASE_TABLET | Freq: Every day | ORAL | Status: DC | PRN

## 2018-09-29 MED ORDER — DM-GUAIFENESIN ER 30-600 MG PO TB12
1.0000 | ORAL_TABLET | Freq: Two times a day (BID) | ORAL | Status: DC | PRN
  Administered 2018-09-29: 1 via ORAL
  Filled 2018-09-29: qty 1

## 2018-09-29 MED ORDER — ENOXAPARIN SODIUM 40 MG/0.4ML ~~LOC~~ SOLN
40.0000 mg | SUBCUTANEOUS | Status: DC
  Administered 2018-09-29 – 2018-09-30 (×2): 40 mg via SUBCUTANEOUS
  Filled 2018-09-29 (×2): qty 0.4

## 2018-09-29 MED ORDER — LORATADINE 10 MG PO TABS
10.0000 mg | ORAL_TABLET | Freq: Every day | ORAL | Status: DC
  Administered 2018-09-29 – 2018-10-01 (×3): 10 mg via ORAL
  Filled 2018-09-29 (×3): qty 1

## 2018-09-29 MED ORDER — ZOLPIDEM TARTRATE 5 MG PO TABS: 5.0000 mg | ORAL_TABLET | Freq: Every evening | ORAL | Status: DC | PRN

## 2018-09-29 MED ORDER — HYDRALAZINE HCL 20 MG/ML IJ SOLN: 5.0000 mg | INTRAMUSCULAR | Status: DC | PRN

## 2018-09-29 MED ORDER — SODIUM CHLORIDE 0.9 % IV SOLN
INTRAVENOUS | Status: DC
  Administered 2018-09-29 – 2018-10-01 (×5): via INTRAVENOUS

## 2018-09-29 MED ORDER — ACETAMINOPHEN 325 MG PO TABS
650.0000 mg | ORAL_TABLET | Freq: Four times a day (QID) | ORAL | Status: DC | PRN
  Administered 2018-09-29 – 2018-10-01 (×4): 650 mg via ORAL
  Filled 2018-09-29 (×4): qty 2

## 2018-09-29 NOTE — Progress Notes (Signed)
Patient admitted after midnight, please see H&P.  Here with SOB/fever and chills.  Found to have b/l upper lobe PNA but will need repeat x ray in 3-4 weeks to ensure resolution.  Patient was 88% on RA and now is 92% on 2L.  Plan to continue IV abx and wean O2 as tolerated.  Patient still appears uncomfortable with mild increase work of breathing. Eulogio Bear DO

## 2018-09-29 NOTE — H&P (Signed)
History and Physical    [name] EXB:[id number] DOB: 1941-03-28 DOA: 09/28/2018  Referring MD/NP/PA:   PCP: Chriss Czar, MD   Patient coming from:  The patient is coming from home.  At baseline, pt is independent for most of ADL.  Chief Complaint: Shortness of breath, chest pain, fever and chills  HPI: Tonya Miranda is a 77 y.o. female with medical history significant of hypertension, diet-controlled diabetes, GERD, fibromyalgia, ovarian cancer, who presents with shortness of breath, chest pain, fever and chills.  Patient states that she has been having shortness of breath and chest pain in the past 4 days, which has worsened today.  The chest pain is located in the left upper chest, constant, sharp, nonradiating. She also has fever of 101-104 at home, chills.  Denies cough.  She has nausea and vomited several times.  Patient states that she has history of chronic mild abdominal pain, which has not changed recently.  Denies diarrhea.  No symptoms of UTI or unilateral weakness.  ED Course: pt was found to have WBC 11.1, pending lactic acid, negative urinalysis, creatinine 1.01, BUN 15, temperature 99.8, tachycardia, tachypnea, oxygen desaturation to 88% on room air, which improved to 94% on 2 L nasal cannula oxygen.  Chest x-ray showed bilateral upper lobe infiltration.  Patient is placed on telemetry bed of observation.  Review of Systems:   General: has fevers, chills, no body weight gain, has poor appetite, has fatigue HEENT: no blurry vision, hearing changes or sore throat Respiratory: has dyspnea, no coughing, wheezing CV: has chest pain, no palpitations GI: has nausea, vomiting, abdominal pain, no diarrhea, constipation GU: no dysuria, burning on urination, increased urinary frequency, hematuria  Ext: no leg edema Neuro: no unilateral weakness, numbness, or tingling, no vision change or hearing loss Skin: no rash, no skin tear. MSK: No muscle spasm, no  deformity, no limitation of range of movement in spin Heme: No easy bruising.  Travel history: No recent long distant travel.  Allergy:  Allergies  Allergen Reactions  . Latex Dermatitis    (tape)  . Morphine And Related     Hallucinations, headache    Past Medical History:  Diagnosis Date  . 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 Chronic headaches   . Diabetes mellitus   . Diverticulosis of sigmoid colon 04/02/2008   Dr. Oretha Ellis  . Fibromyalgia   . GERD (gastroesophageal reflux disease)   . Hypertension   . Osteoarthritis   . Ovarian cancer ([place])   . PONV (postoperative nausea and vomiting)     Past Surgical History:  Procedure Laterality Date  . ABDOMINAL HYSTERECTOMY     ovarian cancer  . BRAIN SURGERY     aneurysm  . CATARACT EXTRACTION W/PHACO Left 07/11/2018   Procedure: CATARACT EXTRACTION PHACO AND INTRAOCULAR LENS PLACEMENT ([place])  LEFT;  Surgeon: Eulogio Bear, MD;  Location: [place] [facility];  Service: Ophthalmology;  Laterality: Left;  Diabetic - diet controlled  . CATARACT EXTRACTION W/PHACO Right 08/22/2018   Procedure: CATARACT EXTRACTION PHACO AND INTRAOCULAR LENS PLACEMENT ([place]) RIGHT;  Surgeon: Eulogio Bear, MD;  Location: [facility];  Service: Ophthalmology;  Laterality: Right;  . CHOLECYSTECTOMY    . COLONOSCOPY  04/02/2008   Dr. Oretha Ellis, [place].  Marland Kitchen COLONOSCOPY W/ BIOPSIES  09/09/1999   Dr. Durwin Reges Imam  . CRANIECTOMY  08/10/2012   Procedure: CRANIECTOMY FOR TIC DOULOUREUX;  Surgeon: Hosie Spangle, MD;  Location: [facility];  Service:  Neurosurgery;  Laterality: Right;  Right Retromastoid Craniectomy with Microvascular Decompression, Decompression of the trigeminal nerve  . ESOPHAGOGASTRODUODENOSCOPY  01/07/2012   Dr. Oretha Ellis, [place].  . HERNIA REPAIR     left inguinal repair and adhesion repair    Social History:  reports that she has never smoked. She has never used smokeless tobacco. She reports that she drinks alcohol. She  reports that she does not use drugs.  Family History:  Family History  Problem Relation Age of Onset  . Mesothelioma Sister   . Bone cancer Sister   . Cancer Brother        unknown  . Colon cancer Neg Hx   . Esophageal cancer Neg Hx      Prior to Admission medications   Medication Sig Start Date End Date Taking? Authorizing Provider  acetaminophen (TYLENOL) 500 MG tablet Take 500 mg by mouth every 6 (six) hours as needed for moderate pain.    Yes [provider]  amLODipine (NORVASC) 5 MG tablet Take 5 mg by mouth daily.  08/26/17  Yes [provider]  Bilberry, Vaccinium myrtillus, (BILBERRY PO) Take 1 tablet by mouth daily.   Yes [provider]  cetirizine (ZYRTEC) 10 MG tablet Take 10 mg by mouth daily.   Yes [provider]  Cholecalciferol (VITAMIN D3) 2000 units TABS Take 1 tablet by mouth daily.    Yes [provider]  diphenhydrAMINE (BENADRYL) 25 MG tablet Take 25 mg by mouth every 6 (six) hours as needed for allergies.    Yes [provider]  fluticasone (FLONASE) 50 MCG/ACT nasal spray Place 2 sprays into the nose daily.   Yes [provider]  GARLIC PO Take 1 tablet by mouth daily.    Yes [provider]  GELATIN PO Take 1 Dose by mouth daily.   Yes [provider]  ibuprofen (ADVIL,MOTRIN) 200 MG tablet Take 200 mg by mouth every 6 (six) hours as needed for mild pain.    Yes [provider]  Magnesium Hydroxide (MILK OF MAGNESIA PO) Take 30 mLs by mouth daily as needed (indigestion).    Yes [provider]  meloxicam (MOBIC) 7.5 MG tablet Take 7.5 mg by mouth 2 (two) times daily as needed for pain.  03/10/18  Yes [provider]  Multiple Vitamin (MULTIVITAMIN WITH MINERALS) TABS Take 1 tablet by mouth daily.   Yes [provider]  pantoprazole (PROTONIX) 40 MG tablet Take 40 mg by mouth daily as needed. Heart Burn   Yes [provider]  TURMERIC PO  Take 1 Dose by mouth daily as needed (arthritis).    Yes [provider]    Physical Exam: Vitals:   09/29/18 0315 09/29/18 0400 09/29/18 0430 09/29/18 0442  BP: (!) 100/49 115/64 111/61   Pulse: 89 85 86   Resp: 18 15 20    Temp:   98.3 F (36.8 C)   TempSrc:   Oral   SpO2: 97% 98% 96%   Weight: 95.3 kg   91.9 kg  Height: 5' (1.524 m)   5\' 1"  (1.549 m)   General: Not in acute distress HEENT:       Eyes: PERRL, EOMI, no scleral icterus.       ENT: No discharge from the ears and nose, no pharynx injection, no tonsillar enlargement.        Neck: No JVD, no bruit, no mass felt. Heme: No neck lymph node enlargement. Cardiac: S1/S2, RRR, No murmurs, No  gallops or rubs. Respiratory: has fine crackles in left side of lung. No wheezing. GI: Soft, nondistended, has mild diffused tenderness, no rebound pain, no organomegaly, BS present. GU: No hematuria Ext: No pitting leg edema bilaterally. 2+DP/PT pulse bilaterally. Musculoskeletal: No joint deformities, No joint redness or warmth, no limitation of ROM in spin. Skin: No rashes.  Neuro: Alert, oriented X3, cranial nerves II-XII grossly intact, moves all extremities normally. Psych: Patient is not psychotic, no suicidal or hemocidal ideation.  Labs on Admission: I have personally reviewed following labs and imaging studies  CBC: Recent Labs  Lab 09/29/18 0011  WBC 11.1*  HGB 9.8*  HCT 31.6*  MCV 86.6  PLT 812   Basic Metabolic Panel: Recent Labs  Lab 09/29/18 0011  NA 133*  K 4.0  CL 103  CO2 22  GLUCOSE 174*  BUN 15  CREATININE 1.01*  CALCIUM 8.3*   GFR: Estimated Creatinine Clearance: 47.4 mL/min (A) (by C-G formula based on SCr of 1.01 mg/dL (H)). Liver Function Tests: Recent Labs  Lab 09/29/18 0125  AST 19  ALT 18  ALKPHOS 150*  BILITOT 0.8  PROT 6.3*  ALBUMIN 2.7*   Recent Labs  Lab 09/29/18 0125  LIPASE 23   No results for input(s): AMMONIA in the last 168 hours. Coagulation Profile: No  results for input(s): INR, PROTIME in the last 168 hours. Cardiac Enzymes: No results for input(s): CKTOTAL, CKMB, CKMBINDEX, TROPONINI in the last 168 hours. BNP (last 3 results) No results for input(s): PROBNP in the last 8760 hours. HbA1C: No results for input(s): HGBA1C in the last 72 hours. CBG: No results for input(s): GLUCAP in the last 168 hours. Lipid Profile: No results for input(s): CHOL, HDL, LDLCALC, TRIG, CHOLHDL, LDLDIRECT in the last 72 hours. Thyroid Function Tests: No results for input(s): TSH, T4TOTAL, FREET4, T3FREE, THYROIDAB in the last 72 hours. Anemia Panel: No results for input(s): VITAMINB12, FOLATE, FERRITIN, TIBC, IRON, RETICCTPCT in the last 72 hours. Urine analysis:    Component Value Date/Time   COLORURINE YELLOW 09/29/2018 0335   APPEARANCEUR CLEAR 09/29/2018 0335   LABSPEC 1.005 09/29/2018 0335   PHURINE 7.0 09/29/2018 0335   GLUCOSEU NEGATIVE 09/29/2018 0335   HGBUR NEGATIVE 09/29/2018 0335   BILIRUBINUR NEGATIVE 09/29/2018 0335   KETONESUR 5 (A) 09/29/2018 0335   PROTEINUR NEGATIVE 09/29/2018 0335   NITRITE NEGATIVE 09/29/2018 0335   LEUKOCYTESUR NEGATIVE 09/29/2018 0335   Sepsis Labs: @LABRCNTIP (procalcitonin:4,lacticidven:4) )No results found for this or any previous visit (from the past 240 hour(s)).   Radiological Exams on Admission: Dg Chest 2 View  Result Date: 09/29/2018 CLINICAL DATA:  Fatigue, fever, chest pain EXAM: CHEST - 2 VIEW COMPARISON:  04/08/2018 FINDINGS: Rounded masslike airspace opacities noted in both upper lobes, left larger than right. Given the these are new since prior study, these likely reflect pneumonia. Heart is upper limits normal in size. No effusions or acute bony abnormality. IMPRESSION: Rounded airspace opacities in the upper lobes bilaterally, new since prior study, most likely rounded pneumonia. Followup PA and lateral chest X-ray is recommended in 3-4 weeks following trial of antibiotic therapy to ensure  resolution and exclude underlying malignancy. Electronically Signed   By: Rolm Baptise M.D.   On: 09/29/2018 00:43     EKG: Independently reviewed.  Sinus rhythm, tachycardia, QTC 433, poor R wave progression, LAD, low voltage.  Assessment/Plan Principal Problem:   Lobar pneumonia (HCC) Active Problems:   Hypertension   GERD (gastroesophageal reflux disease)   Diabetes mellitus without  complication (HCC)   Sepsis ([place])   Normocytic anemia   Acute respiratory failure with hypoxia (HCC)   Acute respiratory failure with hypoxi due to lobar pneumonia and sepsis: Patient meets criteria for sepsis with leukocytosis, fever, tachycardia and tachypnea.  Lactic acid is normal, 0.82.  Currently hemodynamically stable.  - Will place on telemetry bed for obs. - IV Rocephin and azithromycin - Mucinex for cough  - Xopenex Neb prn for SOB - Urine legionella and S. pneumococcal antigen - Follow up blood culture x2, sputum culture and plus Flu pcr - will get Procalcitonin and trend lactic acid level per sepsis protocol - IVF: 3L of NS bolus in ED, followed by 75 mL per hour of NS  Hypertension: -hold home Bp med since pt is at risk of developing hypotension due to Sepsis -IV hydralazine PRN  GERD: -Protonix  Diet controled Diabetes mellitus without complication ([place]): Last A1c 6.2 on 04/07/18, well controled. Patient is not  taking meds at home. CBG 174 today. -check CBG qAM  Normocytic anemia: Hemoglobin dropped slightly from 11.6 on 04/11/18-9.8.  No bleeding tendency, denies rectal bleeding. -Follow-up by CBC and with PCP   DVT ppx: SQ Heparin Code Status: Full code Family Communication: None at bed side.      Disposition Plan:  Anticipate discharge back to previous home environment Consults called:  none Admission status: Obs / tele     Date of Service 09/29/2018    Ivor Costa [facility] Pager [phone]  If 7PM-7AM, please contact night-coverage [email or web address] Password  [email or web address] 09/29/2018, 7:01 AM

## 2018-09-29 NOTE — ED Provider Notes (Addendum)
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  Woke up tonight with left-sided chest pain that she is unable to describe.  Did feel short of breath.  Found to be febrile and hypoxic with EMS.  Does not wear oxygen at home.  Sats were 82% on room air.  Did have one episode of vomiting.  No diarrhea.  No abdominal pain.  Complains of chronic headache.  No new headache or new neck pain.  No neck stiffness.  No rash.  Has not had a flu shot this year.  ROS: See HPI Constitutional: fever  Eyes: no drainage  ENT: no runny nose   Cardiovascular:  chest pain  Resp: SOB  GI: vomiting GU: no dysuria Integumentary: no rash  Allergy: no hives  Musculoskeletal: no leg swelling  Neurological: no slurred speech ROS otherwise negative  PAST MEDICAL HISTORY/PAST SURGICAL HISTORY:  Past Medical History:  Diagnosis Date  . 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 Chronic headaches   . Diabetes mellitus   . Diverticulosis of sigmoid colon 04/02/2008   Dr. Oretha Ellis  . Fibromyalgia   . GERD (gastroesophageal reflux disease)   . Hypertension   . Osteoarthritis   . Ovarian cancer ([place])   . PONV (postoperative nausea and vomiting)     MEDICATIONS:  Prior to Admission medications   Medication Sig Start Date End Date Taking? Authorizing Provider  acetaminophen (TYLENOL) 500 MG tablet Take 500 mg by mouth every 6 (six) hours as needed for moderate pain.     [provider]  amLODipine (NORVASC) 5 MG tablet Take 5 mg by mouth daily.  08/26/17   [provider]  cetirizine (ZYRTEC) 10 MG tablet Take 10 mg by mouth daily.    [provider]  Cholecalciferol (VITAMIN D3) 2000 units TABS Take by mouth daily.    [provider]  diphenhydrAMINE  (BENADRYL) 25 MG tablet Take 25 mg by mouth as needed for allergies.     [provider]  fluticasone (FLONASE) 50 MCG/ACT nasal spray Place 2 sprays into the nose daily.    [provider]  GARLIC PO Take by mouth daily.    [provider]  GELATIN PO Take 1 Dose by mouth daily.    [provider]  ibuprofen (ADVIL,MOTRIN) 200 MG tablet Take 200 mg by mouth every 6 (six) hours as needed for mild pain.     [provider]  Magnesium Hydroxide (MILK OF MAGNESIA PO) Take by mouth as needed.    [provider]  meloxicam (MOBIC) 7.5 MG tablet Take 7.5 mg by mouth 2 (two) times daily as needed. 03/10/18   [provider]  Multiple Vitamin (MULTIVITAMIN WITH MINERALS) TABS Take 1 tablet by mouth daily.    [provider]  pantoprazole (PROTONIX) 40 MG tablet Take 40 mg by mouth daily as needed. Heart Burn    [provider]  TURMERIC PO Take 1 Dose by mouth daily as needed. Arthritis    [provider]    ALLERGIES:  Allergies  Allergen Reactions  . Latex Dermatitis    (tape)  . Morphine And Related     Hallucinations, headache    SOCIAL HISTORY:  Social History   Tobacco Use  . Smoking status: Never  Smoker  . Smokeless tobacco: Never Used  Substance Use Topics  . Alcohol use: Yes    Comment: wine rarely    FAMILY HISTORY: Family History  Problem Relation Age of Onset  . Mesothelioma Sister   . Bone cancer Sister   . Cancer Brother        unknown  . Colon cancer Neg Hx   . Esophageal cancer Neg Hx     EXAM: BP 116/62   Pulse (!) 104   Temp 99.8 F (37.7 C) (Oral)   Resp 19   SpO2 96%  CONSTITUTIONAL: Alert and oriented and responds appropriately to questions. Well-appearing; well-nourished HEAD: Normocephalic EYES: Conjunctivae clear, pupils appear equal, EOMI ENT: normal nose; moist mucous membranes NECK: Supple, no meningismus, no nuchal rigidity, no LAD  CARD: Regular and  tachycardic; S1 and S2 appreciated; no murmurs, no clicks, no rubs, no gallops RESP: Normal chest excursion without splinting or tachypnea; breath sounds clear and equal bilaterally; no wheezes, no rhonchi, no rales, no hypoxia or respiratory distress, speaking full sentences ABD/GI: Normal bowel sounds; non-distended; soft, non-tender, no rebound, no guarding, no peritoneal signs, no hepatosplenomegaly BACK:  The back appears normal and is non-tender to palpation, there is no CVA tenderness EXT: Normal ROM in all joints; non-tender to palpation; no edema; normal capillary refill; no cyanosis, no calf tenderness or swelling    SKIN: Normal color for age and race; warm; no rash NEURO: Moves all extremities equally PSYCH: The patient's mood and manner are appropriate. Grooming and personal hygiene are appropriate.  MEDICAL DECISION MAKING: Patient here with fevers, hypoxia on room air with EMS, chest pain.  I am concerned for possible pneumonia, sepsis.  Less likely ACS.  EKG shows sinus tachycardia without ischemic abnormality.  Doubt PE or dissection at this time.  Will obtain labs, cultures, urine, chest x-ray.  ED PROGRESS: Chest x-ray confirms bilateral upper lobe pneumonia.  Troponin normal.  Lactate normal.  Will send flu swab, blood cultures.  Will give ceftriaxone, azithromycin.  She is not hypotensive at this time.  Will hydrate gently.  I do not feel she needs 30 mL/kg IV fluid bolus at this time given normal blood pressure, normal lactate.  Discussed patient's case with hospitalist, Dr. Blaine Hamper.  I have recommended admission and patient (and family if present) agree with this plan. Admitting physician will place admission orders.   I reviewed all nursing notes, vitals, pertinent previous records, EKGs, lab and urine results, imaging (as available).  Since blood pressure has dropped to 100/49, pt has received 3 L of IV fluids in the ED.    EKG Interpretation  Date/Time:  Thursday September 29 2018 00:06:23 EDT Ventricular Rate:  105 PR Interval:    QRS Duration: 105 QT Interval:  327 QTC Calculation: 433 R Axis:   -51 Text Interpretation:  Sinus tachycardia Left anterior fascicular block Anterior infarct, old ST elevation, consider inferior injury Baseline wander in lead(s) I II III aVR aVF V3 V4 V5 No significant change since last tracing other than rate is faster Confirmed by Aneesa Romey, Cyril Mourning [phone]) on 09/29/2018 12:42:58 AM        CRITICAL CARE Performed by: Cyril Mourning Willamina Grieshop   Total critical care time: 45 minutes  Critical care time was exclusive of separately billable procedures and treating other patients.  Critical care was necessary to treat or prevent imminent or life-threatening deterioration.  Critical care was time spent personally by me on the following activities: development of treatment plan with  patient and/or surrogate as well as nursing, discussions with consultants, evaluation of patient's response to treatment, examination of patient, obtaining history from patient or surrogate, ordering and performing treatments and interventions, ordering and review of laboratory studies, ordering and review of radiographic studies, pulse oximetry and re-evaluation of patient's condition.     Timotheus Salm, Delice Bison, DO 09/29/18 0222    Elayna Tobler, Delice Bison, DO 09/29/18 [phone]

## 2018-09-29 NOTE — ED Triage Notes (Addendum)
Pt. Arrived by EMS from home with reports of left non-radiating CP that woke her up from her sleep. Pt. Took 324 of aspirin.   Pt. Recently seen by PCP and they believe she has the flu. Pt. Temp 102 with EMS. 975 of tylenol given by EMS and 4 of zofran. Pt. Had one episode of vomiting prior to their arrival.  A/O X4. Pt. Reports only a headache currently

## 2018-09-30 LAB — CBC
HEMATOCRIT: 30 % — AB (ref 36.0–46.0)
Hemoglobin: 9.2 g/dL — ABNORMAL LOW (ref 12.0–15.0)
MCH: 26.8 pg (ref 26.0–34.0)
MCHC: 30.7 g/dL (ref 30.0–36.0)
MCV: 87.5 fL (ref 80.0–100.0)
Platelets: 195 10*3/uL (ref 150–400)
RBC: 3.43 MIL/uL — AB (ref 3.87–5.11)
RDW: 16.9 % — ABNORMAL HIGH (ref 11.5–15.5)
WBC: 7.2 10*3/uL (ref 4.0–10.5)
nRBC: 0 % (ref 0.0–0.2)

## 2018-09-30 LAB — BASIC METABOLIC PANEL
Anion gap: 8 (ref 5–15)
BUN: 9 mg/dL (ref 8–23)
CHLORIDE: 112 mmol/L — AB (ref 98–111)
CO2: 22 mmol/L (ref 22–32)
CREATININE: 0.74 mg/dL (ref 0.44–1.00)
Calcium: 8.2 mg/dL — ABNORMAL LOW (ref 8.9–10.3)
GFR calc Af Amer: >60 mL/min (ref >60)
Glucose, Bld: 110 mg/dL — ABNORMAL HIGH (ref 70–99)
Potassium: 3.6 mmol/L (ref 3.5–5.1)
Sodium: 142 mmol/L (ref 135–145)

## 2018-09-30 LAB — LEGIONELLA PNEUMOPHILA SEROGP 1 UR AG: L. pneumophila Serogp 1 Ur Ag: POSITIVE — AB

## 2018-09-30 LAB — GLUCOSE, CAPILLARY: Glucose-Capillary: 108 mg/dL — ABNORMAL HIGH (ref 70–99)

## 2018-09-30 LAB — MRSA PCR SCREENING: MRSA by PCR: NEGATIVE

## 2018-09-30 LAB — PROCALCITONIN: Procalcitonin: 0.28 ng/mL

## 2018-09-30 MED ORDER — IBUPROFEN 600 MG PO TABS
600.0000 mg | ORAL_TABLET | Freq: Once | ORAL | Status: AC
  Administered 2018-09-30: 600 mg via ORAL
  Filled 2018-09-30: qty 1

## 2018-09-30 MED ORDER — MAGNESIUM HYDROXIDE 400 MG/5ML PO SUSP
30.0000 mL | Freq: Two times a day (BID) | ORAL | Status: DC | PRN
  Administered 2018-09-30: 30 mL via ORAL
  Filled 2018-09-30: qty 30

## 2018-09-30 NOTE — Progress Notes (Signed)
PROGRESS NOTE    Tonya Miranda  DJS:[id number] DOB: Feb 03, 1941 DOA: 88/50/2774 PCP: Chriss Czar, MD   Brief Narrative:  Tonya Miranda is a 77 y.o. female with medical history significant of hypertension, diet-controlled diabetes, GERD, fibromyalgia, ovarian cancer, who presents with shortness of breath, chest pain, fever and chills with CXR findings concerning for pneumonia.   Assessment & Plan:   Principal Problem:   Lobar pneumonia ([place]) Active Problems:   Hypertension   GERD (gastroesophageal reflux disease)   Diabetes mellitus without complication (HCC)   Sepsis ([place])   Normocytic anemia   Acute respiratory failure with hypoxia (HCC)   Acute respiratory failure with hypoxi due to lobar pneumonia and sepsis: Legionella postive.  Pt febrile overnight.  She's not feeling much better yet, consider CT if not improving with abx. - continue ceftriaxone/azithromycin - mucinex - nebs - urine legionella positive - Negative urine strep - negative influenza - follow sputum cx - follow blood cx  Hypertension: -hold home Bp med for now  GERD: -Protonix  Diet controled Diabetes mellitus without complication ([place]): Last A1c 6.2 on 04/07/18, well controled. Patient is not  taking meds at home.  AM BG appropriate, follow. -check CBG qAM  Normocytic anemia: Continue to follow, will need outpatient follow up.  DVT prophylaxis: lovenox Code Status: full  Family Communication: husband at bedside Disposition Plan: pending improvement   Consultants:   none  Procedures:   none  Antimicrobials:  Anti-infectives (From admission, onward)   Start     Dose/Rate Route Frequency Ordered Stop   09/29/18 0115  cefTRIAXone (ROCEPHIN) 2 g in sodium chloride 0.9 % 100 mL IVPB     2 g 200 mL/hr over 30 Minutes Intravenous Every 24 hours 09/29/18 0103     09/29/18 0115  azithromycin (ZITHROMAX) 500 mg in sodium chloride 0.9 % 250 mL IVPB     500 mg 250 mL/hr over 60  Minutes Intravenous Every 24 hours 09/29/18 0103       Subjective: Has had poor energy for a while, weeks? Denies SOB.   Feels like thinks just got worse over past week  Objective: Vitals:   09/30/18 0241 09/30/18 0539 09/30/18 1120 09/30/18 1217  BP:  (!) 114/58 116/86 122/63  Pulse:  87 80 79  Resp:  19  19  Temp: 98.4 F (36.9 C) 98 F (36.7 C)  98.3 F (36.8 C)  TempSrc: Oral Oral  Oral  SpO2:  90% 95% 95%  Weight:  93.4 kg    Height:        Intake/Output Summary (Last 24 hours) at 09/30/2018 1903 Last data filed at 09/30/2018 1854 Gross per 24 hour  Intake 2002.53 ml  Output 1050 ml  Net 952.53 ml   Filed Weights   09/29/18 0315 09/29/18 0442 09/30/18 0539  Weight: 95.3 kg 91.9 kg 93.4 kg    Examination:  General exam: Appears calm and comfortable  Respiratory system: Clear to auscultation. Respiratory effort normal. Cardiovascular system: S1 & S2 heard, RRR. Gastrointestinal system: Abdomen is nondistended, soft and nontender.  Central nervous system: Alert and oriented. No focal neurological deficits. Extremities: Symmetric 5 x 5 power. Skin: No rashes, lesions or ulcers Psychiatry: Judgement and insight appear normal. Mood & affect appropriate.     Data Reviewed: I have personally reviewed following labs and imaging studies  CBC: Recent Labs  Lab 09/29/18 0011 09/30/18 0619  WBC 11.1* 7.2  HGB 9.8* 9.2*  HCT 31.6* 30.0*  MCV 86.6 87.5  PLT 234 630   Basic Metabolic Panel: Recent Labs  Lab 09/29/18 0011 09/30/18 0619  NA 133* 142  K 4.0 3.6  CL 103 112*  CO2 22 22  GLUCOSE 174* 110*  BUN 15 9  CREATININE 1.01* 0.74  CALCIUM 8.3* 8.2*   GFR: Estimated Creatinine Clearance: 60.4 mL/min (by C-G formula based on SCr of 0.74 mg/dL). Liver Function Tests: Recent Labs  Lab 09/29/18 0125  AST 19  ALT 18  ALKPHOS 150*  BILITOT 0.8  PROT 6.3*  ALBUMIN 2.7*   Recent Labs  Lab 09/29/18 0125  LIPASE 23   No results for input(s):  AMMONIA in the last 168 hours. Coagulation Profile: No results for input(s): INR, PROTIME in the last 168 hours. Cardiac Enzymes: No results for input(s): CKTOTAL, CKMB, CKMBINDEX, TROPONINI in the last 168 hours. BNP (last 3 results) No results for input(s): PROBNP in the last 8760 hours. HbA1C: No results for input(s): HGBA1C in the last 72 hours. CBG: Recent Labs  Lab 09/29/18 0745 09/30/18 0811  GLUCAP 124* 108*   Lipid Profile: No results for input(s): CHOL, HDL, LDLCALC, TRIG, CHOLHDL, LDLDIRECT in the last 72 hours. Thyroid Function Tests: No results for input(s): TSH, T4TOTAL, FREET4, T3FREE, THYROIDAB in the last 72 hours. Anemia Panel: No results for input(s): VITAMINB12, FOLATE, FERRITIN, TIBC, IRON, RETICCTPCT in the last 72 hours. Sepsis Labs: Recent Labs  Lab 09/29/18 0129 09/29/18 0458 09/30/18 0619  PROCALCITON  --  0.33 0.28  LATICACIDVEN 0.82  --   --     Recent Results (from the past 240 hour(s))  Blood Culture (routine x 2)     Status: None (Preliminary result)   Collection Time: 09/29/18  1:15 AM  Result Value Ref Range Status   Specimen Description BLOOD RIGHT FOREARM  Final   Special Requests   Final    BOTTLES DRAWN AEROBIC AND ANAEROBIC Blood Culture results may not be optimal due to an inadequate volume of blood received in culture bottles   Culture   Final    NO GROWTH 1 DAY Performed at [facility], [place] [address], [place], [place] [address]    Report Status PENDING  Incomplete  Blood Culture (routine x 2)     Status: None (Preliminary result)   Collection Time: 09/29/18  1:25 AM  Result Value Ref Range Status   Specimen Description BLOOD RIGHT HAND  Final   Special Requests   Final    BOTTLES DRAWN AEROBIC AND ANAEROBIC Blood Culture results may not be optimal due to an inadequate volume of blood received in culture bottles   Culture   Final    NO GROWTH 1 DAY Performed at [facility], [place] [address], [place], [place]  [address]    Report Status PENDING  Incomplete  MRSA PCR Screening     Status: None   Collection Time: 09/30/18 11:49 AM  Result Value Ref Range Status   MRSA by PCR NEGATIVE NEGATIVE Final    Comment:        The GeneXpert MRSA Assay (FDA approved for NASAL specimens only), is one component of a comprehensive MRSA colonization surveillance program. It is not intended to diagnose MRSA infection nor to guide or monitor treatment for MRSA infections. Performed at [facility], [place] [address] [place], [place] [address]          Radiology Studies: Dg Chest 2 View  Result Date: 09/29/2018 CLINICAL DATA:  Fatigue, fever, chest pain  EXAM: CHEST - 2 VIEW COMPARISON:  04/08/2018 FINDINGS: Rounded masslike airspace opacities noted in both upper lobes, left larger than right. Given the these are new since prior study, these likely reflect pneumonia. Heart is upper limits normal in size. No effusions or acute bony abnormality. IMPRESSION: Rounded airspace opacities in the upper lobes bilaterally, new since prior study, most likely rounded pneumonia. Followup PA and lateral chest X-ray is recommended in 3-4 weeks following trial of antibiotic therapy to ensure resolution and exclude underlying malignancy. Electronically Signed   By: Rolm Baptise M.D.   On: 09/29/2018 00:43        Scheduled Meds: . enoxaparin (LOVENOX) injection  40 mg Subcutaneous Q24H  . fluticasone  2 spray Each Nare Daily  . loratadine  10 mg Oral Daily  . multivitamin with minerals  1 tablet Oral Daily  . pantoprazole  40 mg Oral BID   Continuous Infusions: . sodium chloride 75 mL/hr at 09/30/18 1337  . azithromycin Stopped (09/30/18 0248)  . cefTRIAXone (ROCEPHIN)  IV Stopped (09/30/18 0136)     LOS: 1 day    Time spent: over 24 min    Fayrene Helper, MD [facility] Pager [phone]  If 7PM-7AM, please contact night-coverage [email or web address] Password [email or web address] 09/30/2018, 7:03 PM

## 2018-09-30 NOTE — Care Management Note (Signed)
Case Management Note  Patient Details  Name: Tonya Miranda MRN: [id number] Date of Birth: 03-30-1941  Subjective/Objective:       Pneumonia            Action/Plan: Patient lives at home; PCP: Chriss Czar, MD; has private insurance with Medicare; CM following for progression of care.  Expected Discharge Date:     Possibly 10/04/2018             Expected Discharge Plan:  Home/Self Care  Discharge planning Services  CM Consult  Status of Service:  In process, will continue to follow  Sherrilyn Rist [phone] 09/30/2018, 1:50 PM

## 2018-10-01 DIAGNOSIS — A481 Legionnaires' disease: Secondary | ICD-10-CM

## 2018-10-01 LAB — COMPREHENSIVE METABOLIC PANEL
ALBUMIN: 2.5 g/dL — AB (ref 3.5–5.0)
ALT: 21 U/L (ref 0–44)
ANION GAP: 8 (ref 5–15)
AST: 27 U/L (ref 15–41)
Alkaline Phosphatase: 168 U/L — ABNORMAL HIGH (ref 38–126)
BUN: <5 mg/dL — ABNORMAL LOW (ref 8–23)
CALCIUM: 8.7 mg/dL — AB (ref 8.9–10.3)
CO2: 25 mmol/L (ref 22–32)
Chloride: 107 mmol/L (ref 98–111)
Creatinine, Ser: 0.69 mg/dL (ref 0.44–1.00)
GFR calc non Af Amer: >60 mL/min (ref >60)
GLUCOSE: 107 mg/dL — AB (ref 70–99)
Potassium: 3.6 mmol/L (ref 3.5–5.1)
SODIUM: 140 mmol/L (ref 135–145)
Total Bilirubin: 0.5 mg/dL (ref 0.3–1.2)
Total Protein: 6.4 g/dL — ABNORMAL LOW (ref 6.5–8.1)

## 2018-10-01 LAB — CBC
HCT: 31.2 % — ABNORMAL LOW (ref 36.0–46.0)
HEMOGLOBIN: 9.4 g/dL — AB (ref 12.0–15.0)
MCH: 26 pg (ref 26.0–34.0)
MCHC: 30.1 g/dL (ref 30.0–36.0)
MCV: 86.4 fL (ref 80.0–100.0)
PLATELETS: 275 10*3/uL (ref 150–400)
RBC: 3.61 MIL/uL — ABNORMAL LOW (ref 3.87–5.11)
RDW: 16.7 % — AB (ref 11.5–15.5)
WBC: 8.6 10*3/uL (ref 4.0–10.5)
nRBC: 0.2 % (ref 0.0–0.2)

## 2018-10-01 LAB — GLUCOSE, CAPILLARY: GLUCOSE-CAPILLARY: 86 mg/dL (ref 70–99)

## 2018-10-01 LAB — PROCALCITONIN: PROCALCITONIN: 0.25 ng/mL

## 2018-10-01 LAB — MAGNESIUM: Magnesium: 2.2 mg/dL (ref 1.7–2.4)

## 2018-10-01 MED ORDER — AMOXICILLIN 500 MG PO CAPS: 1000.0000 mg | ORAL_CAPSULE | Freq: Three times a day (TID) | ORAL | Status: DC

## 2018-10-01 MED ORDER — AZITHROMYCIN 500 MG PO TABS: 500.0000 mg | ORAL_TABLET | Freq: Every day | ORAL | Status: DC

## 2018-10-01 MED ORDER — AZITHROMYCIN 500 MG PO TABS: 500.0000 mg | ORAL_TABLET | Freq: Every day | ORAL | 0 refills | Status: AC

## 2018-10-01 MED ORDER — AMOXICILLIN 500 MG PO CAPS: 1000.0000 mg | ORAL_CAPSULE | Freq: Three times a day (TID) | ORAL | 0 refills | Status: AC

## 2018-10-01 NOTE — Progress Notes (Signed)
Respiratory Note:  Ambulated patient in hallway past nurses station with 02 at 2L [place]. Sats ranged between 92 and 95 percent on 2/L while ambulating.  Pt returned to room and sitting on side of bed with 02 2/L per [place].

## 2018-10-01 NOTE — Progress Notes (Signed)
SATURATION QUALIFICATIONS: (This note is used to comply with regulatory documentation for home oxygen)  Patient Saturations on Room Air at Rest = 96%  Patient Saturations on Room Air while Ambulating =92%  Patient Saturations on0 Liters of oxygen while Ambulating = 0%  Please briefly explain why patient needs home oxygen: Not indicated

## 2018-10-01 NOTE — Progress Notes (Signed)
SATURATION QUALIFICATIONS: (This note is used to comply with regulatory documentation for home oxygen)  Patient Saturations on Room Air at Rest = 89%  Patient Saturations on Room Air while Ambulating = 86-88%  Patient Saturations on 2 Liters of oxygen while Ambulating =92-95%  Please briefly explain why patient needs home oxygen:With exertion patient not maintaining sats greater than 88-89 on room air.  When ambulating without 02 patient ranges from 86-88.  With 2/L O2 [place] applied patient sats between 92-95 when ambulating patient.

## 2018-10-01 NOTE — Progress Notes (Signed)
Patient discharging home this date with family to provided transportation.  All personal belongings with patient.  Pt voices no c/o at this time.  All discharge information, meds and follow up appts reviewed with patient.  Patient is being discharged home with 02 to be utilized at 2 liters and follow up with PCP.

## 2018-10-01 NOTE — Discharge Summary (Signed)
Physician Discharge Summary  Tonya Miranda NUU:[id number] DOB: 1941/09/22 DOA: 34/74/2595  PCP: Chriss Czar, MD  Admit date: 09/28/2018 Discharge date: 10/01/2018  Time spent: 35 minutes  Recommendations for Outpatient Follow-up:  1. Follow up outpatient CBC/CMP 2. Follow up antibiotic duration.  Pt seems to be improving and 7 day course of azithromycin prescribed, but course may need to be extended depending on her clinical course.  Discussed with patient who suspects she'll be able to get in to see her PCP early next week. 3. Follow up CXR in 3-4 weeks to exclude malignancy 4. Wean O2 as tolerated  Discharge Diagnoses:  Principal Problem:   Lobar pneumonia ([place]) Active Problems:   Hypertension   GERD (gastroesophageal reflux disease)   Diabetes mellitus without complication ([place])   Sepsis ([place])   Normocytic anemia   Acute respiratory failure with hypoxia [facility])   Discharge Condition: stable  Diet recommendation: heart healthy   Filed Weights   09/29/18 0442 09/30/18 0539 10/01/18 0338  Weight: 91.9 kg 93.4 kg 89.5 kg    History of present illness:  Tonya Rasnic [place]is a 77 y.o.femalewith medical history significant ofhypertension, diet-controlled diabetes, GERD, fibromyalgia, ovarian cancer, who presents with shortness of breath, chest pain, fever and chills with CXR findings concerning for pneumonia.  She was found to have legionella pneumonia.  She improved on on 10/26, she was on room air at rest.  She did desat with activity, but given her clinical improvement, discharged with oxygen with ambulation and sleep.  Discharged with 7 day course of azithromycin.  Hospital Course:  Acute respiratory failure with hypoxidue to lobar pneumonia and sepsis:Legionella postive.  Pt afebrile overnight.  Last fever 10/24.  She notes she's feeling better today.  - discharged with azithromycin to complete 7 day total course (will also send with amox to complete a 5 day  total course of beta lactam) - Initially pt walked and maintained O2 sats, but when working with PT, she desatted to 84%.  With overall improvement and on room air with rest, will discharge with O2 with activity and at night. - mucinex - nebs - urine legionella positive - Negative urine strep - negative influenza - follow sputum cx - follow blood cx  Hypertension: -resume home BP meds  GERD: -Protonix  Diet controledDiabetes mellitus without complication (HCC):Last G3O7.5 on 04/07/18, well controled. Patient isnottaking medsat home.  AM BG appropriate, follow. -check CBG qAM  Normocytic anemia:Continue to follow, will need outpatient follow up.  Elevated Alk Phos: follow up outpatient   Procedures:  none  Consultations:  none  Discharge Exam: Vitals:   09/30/18 2001 10/01/18 0426  BP: (!) 145/78 (!) 128/58  Pulse: 92 85  Resp: 18 18  Temp: 98 F (36.7 C) 99 F (37.2 C)  SpO2: 94% 94%   Feeling better today. Would like to go home. Discussed return precautions  General: No acute distress. Cardiovascular: Heart sounds show a regular rate, and rhythm Lungs: Clear to auscultation bilaterally Abdomen: Soft, nontender, nondistended  Neurological: Alert and oriented 3. Moves all extremities 4Cranial nerves II through XII grossly intact. Skin: Warm and dry. No rashes or lesions. Extremities: No clubbing or cyanosis. No edema. Pedal pulses 2+. Psychiatric: Mood and affect are normal. Insight and judgment are appropriate.  Discharge Instructions   Discharge Instructions    Call MD for:  difficulty breathing, headache or visual disturbances   Complete by:  As directed    Call MD for:  extreme fatigue   Complete  by:  As directed    Call MD for:  persistant dizziness or light-headedness   Complete by:  As directed    Call MD for:  persistant nausea and vomiting   Complete by:  As directed    Call MD for:  redness, tenderness, or signs of infection  (pain, swelling, redness, odor or green/yellow discharge around incision site)   Complete by:  As directed    Call MD for:  severe uncontrolled pain   Complete by:  As directed    Call MD for:  temperature >100.4   Complete by:  As directed    Diet - low sodium heart healthy   Complete by:  As directed    Diet - low sodium heart healthy   Complete by:  As directed    Discharge instructions   Complete by:  As directed    You were seen for pneumonia.  You have legionella pneumonia.  We are treating you with azithromycin for this.  You will continue azithromycin for an additional 4 days.  We're also going to keep you on amoxicillin which covers other common infectious causes of pneumonia (you'll continue this another 2 days).  Please follow up with your PCP early next week.    Return for new, recurrent, or worsening symptoms.  Please ask your PCP to request records from this hospitalization so they know what was done and what the next steps will be.   Increase activity slowly   Complete by:  As directed    Increase activity slowly   Complete by:  As directed      Allergies as of 10/01/2018      Reactions   Latex Dermatitis   (tape)   Morphine And Related    Hallucinations, headache      Medication List    STOP taking these medications   meloxicam 7.5 MG tablet Commonly known as:  MOBIC     TAKE these medications   acetaminophen 500 MG tablet Commonly known as:  TYLENOL Take 500 mg by mouth every 6 (six) hours as needed for moderate pain.   amLODipine 5 MG tablet Commonly known as:  NORVASC Take 5 mg by mouth daily.   amoxicillin 500 MG capsule Commonly known as:  AMOXIL Take 2 capsules (1,000 mg total) by mouth 3 (three) times daily for 2 days. Start tomorrow morning   azithromycin 500 MG tablet Commonly known as:  ZITHROMAX Take 1 tablet (500 mg total) by mouth daily for 4 days. Start tomorrow morning   BILBERRY PO Take 1 tablet by mouth daily.   cetirizine  10 MG tablet Commonly known as:  ZYRTEC Take 10 mg by mouth daily.   diphenhydrAMINE 25 MG tablet Commonly known as:  BENADRYL Take 25 mg by mouth every 6 (six) hours as needed for allergies.   fluticasone 50 MCG/ACT nasal spray Commonly known as:  FLONASE Place 2 sprays into the nose daily.   GARLIC PO Take 1 tablet by mouth daily.   GELATIN PO Take 1 Dose by mouth daily.   ibuprofen 200 MG tablet Commonly known as:  ADVIL,MOTRIN Take 200 mg by mouth every 6 (six) hours as needed for mild pain.   MILK OF MAGNESIA PO Take 30 mLs by mouth daily as needed (indigestion).   multivitamin with minerals Tabs tablet Take 1 tablet by mouth daily.   pantoprazole 40 MG tablet Commonly known as:  PROTONIX Take 40 mg by mouth daily as needed. Heart Burn  TURMERIC PO Take 1 Dose by mouth daily as needed (arthritis).   Vitamin D3 2000 units Tabs Take 1 tablet by mouth daily.            Durable Medical Equipment  (From admission, onward)         Start     Ordered   10/01/18 1356  DME Oxygen  Once    Comments:  Pt desatted to 84% with activity.  Sats were maintained at 92-95% with 2 L by [place]  Recommend 2 L [place] with ambulation and at night  Question Answer Comment  Mode or (Route) Nasal cannula   Liters per Minute 2   Frequency Continuous (stationary and portable oxygen unit needed)   Oxygen conserving device Yes   Oxygen delivery system Gas      10/01/18 1355         Allergies  Allergen Reactions  . Latex Dermatitis    (tape)  . Morphine And Related     Hallucinations, headache   Follow-up Information    Chriss Czar, MD.   Specialty:  Family Medicine Contact information: [place] [address] [phone]            The results of significant diagnostics from this hospitalization (including imaging, microbiology, ancillary and laboratory) are listed below for reference.    Significant Diagnostic Studies: Dg Chest 2  View  Result Date: 09/29/2018 CLINICAL DATA:  Fatigue, fever, chest pain EXAM: CHEST - 2 VIEW COMPARISON:  04/08/2018 FINDINGS: Rounded masslike airspace opacities noted in both upper lobes, left larger than right. Given the these are new since prior study, these likely reflect pneumonia. Heart is upper limits normal in size. No effusions or acute bony abnormality. IMPRESSION: Rounded airspace opacities in the upper lobes bilaterally, new since prior study, most likely rounded pneumonia. Followup PA and lateral chest X-ray is recommended in 3-4 weeks following trial of antibiotic therapy to ensure resolution and exclude underlying malignancy. Electronically Signed   By: Rolm Baptise M.D.   On: 09/29/2018 00:43    Microbiology: Recent Results (from the past 240 hour(s))  Blood Culture (routine x 2)     Status: None (Preliminary result)   Collection Time: 09/29/18  1:15 AM  Result Value Ref Range Status   Specimen Description BLOOD RIGHT FOREARM  Final   Special Requests   Final    BOTTLES DRAWN AEROBIC AND ANAEROBIC Blood Culture results may not be optimal due to an inadequate volume of blood received in culture bottles   Culture   Final    NO GROWTH 2 DAYS Performed at [facility], [place] [address], [place], [place] [address]    Report Status PENDING  Incomplete  Blood Culture (routine x 2)     Status: None (Preliminary result)   Collection Time: 09/29/18  1:25 AM  Result Value Ref Range Status   Specimen Description BLOOD RIGHT HAND  Final   Special Requests   Final    BOTTLES DRAWN AEROBIC AND ANAEROBIC Blood Culture results may not be optimal due to an inadequate volume of blood received in culture bottles   Culture   Final    NO GROWTH 2 DAYS Performed at [facility], [place] [address], [place],  [address]    Report Status PENDING  Incomplete  MRSA PCR Screening     Status: None   Collection Time: 09/30/18 11:49 AM  Result Value Ref Range Status   MRSA by PCR  NEGATIVE NEGATIVE Final    Comment:        The GeneXpert MRSA Assay (FDA approved for NASAL specimens only), is one component of a comprehensive MRSA colonization surveillance program. It is not intended to diagnose MRSA infection nor to guide or monitor treatment for MRSA infections. Performed at [facility], [place] [address] [place], [place] [address]      Labs: Basic Metabolic Panel: Recent Labs  Lab 09/29/18 0011 09/30/18 0619 10/01/18 0640  NA 133* 142 140  K 4.0 3.6 3.6  CL 103 112* 107  CO2 '22 22 25  ' GLUCOSE 174* 110* 107*  BUN 15 9 <5*  CREATININE 1.01* 0.74 0.69  CALCIUM 8.3* 8.2* 8.7*  MG  --   --  2.2   Liver Function Tests: Recent Labs  Lab 09/29/18 0125 10/01/18 0640  AST 19 27  ALT 18 21  ALKPHOS 150* 168*  BILITOT 0.8 0.5  PROT 6.3* 6.4*  ALBUMIN 2.7* 2.5*   Recent Labs  Lab 09/29/18 0125  LIPASE 23   No results for input(s): AMMONIA in the last 168 hours. CBC: Recent Labs  Lab 09/29/18 0011 09/30/18 0619 10/01/18 0640  WBC 11.1* 7.2 8.6  HGB 9.8* 9.2* 9.4*  HCT 31.6* 30.0* 31.2*  MCV 86.6 87.5 86.4  PLT 234 195 275   Cardiac Enzymes: No results for input(s): CKTOTAL, CKMB, CKMBINDEX, TROPONINI in the last 168 hours. BNP: BNP (last 3 results) No results for input(s): BNP in the last 8760 hours.  ProBNP (last 3 results) No results for input(s): PROBNP in the last 8760 hours.  CBG: Recent Labs  Lab 09/29/18 0745 09/30/18 0811 10/01/18 0721  GLUCAP 124* 108* 86       Signed:  Fayrene Helper MD.  [facility] 10/01/2018, 7:18 PM

## 2018-10-01 NOTE — Evaluation (Addendum)
Physical Therapy Evaluation Patient Details Name: Tonya Miranda MRN: [id number] DOB: 12/19/1940 Today's Date: 10/01/2018   History of Present Illness  Patient is a 77 y/o female who presents with fever, chills, chest pain and SOB. Found to have bil upper lobe PNA and sepsis. PMH includes ovarian ca, HTN, fibromyalgia, Dm, migraines.   Clinical Impression  Patient presents with mild balance deficits and dyspnea on exertion impacting mobility. Tolerated gait training and stair training with supervision for safety. Pt's Sp02 ranged from 84-91% on RA during mobility and recovered quickly with cues for pursed lip breathing. Encouraged performing shorter bouts of ambulation throughout the day to improve ventilation. Took pt off supplemental 02 after session. Rn notified. Discussed importance of short bouts of activity with longer rest breaks at home. Pt independent PTA and lives with spouse. Does not require skilled therapy services. Anticipate mobility, strength and respiration will improve with increased activity. Discharge from therapy.    Follow Up Recommendations No PT follow up;Supervision - Intermittent    Equipment Recommendations  None recommended by PT    Recommendations for Other Services       Precautions / Restrictions Precautions Precautions: Other (comment) Precaution Comments: watch 02 Restrictions Weight Bearing Restrictions: No      Mobility  Bed Mobility Overal bed mobility: Modified Independent             General bed mobility comments: HOB elevated, no assist needed.   Transfers Overall transfer level: Needs assistance Equipment used: None Transfers: Sit to/from Stand Sit to Stand: Modified independent (Device/Increase time)         General transfer comment: Stood from EOB without difficulty.   Ambulation/Gait Ambulation/Gait assistance: Supervision Gait Distance (Feet): 250 Feet Assistive device: None Gait Pattern/deviations: Step-through  pattern;Decreased stride length;Drifts right/left     General Gait Details: Slow, mildly unsteady gait with drifting to right/left noted esp with head turns but no overt LOB. 2/4 DOE. Sp02 ranged from 84-92% on RA.   Stairs Stairs: Yes Stairs assistance: Supervision Stair Management: One rail Left;Step to pattern Number of Stairs: 4 General stair comments: Cues for technique, HR in low 100s and Sp02 dropped to 84%.  Wheelchair Mobility    Modified Rankin (Stroke Patients Only)       Balance Overall balance assessment: Needs assistance Sitting-balance support: Feet supported;No upper extremity supported Sitting balance-Leahy Scale: Good     Standing balance support: During functional activity Standing balance-Leahy Scale: Fair                               Pertinent Vitals/Pain Pain Assessment: 0-10 Pain Score: 5  Pain Location: headache Pain Descriptors / Indicators: Headache Pain Intervention(s): Monitored during session;Repositioned    Home Living Family/patient expects to be discharged to:: Private residence Living Arrangements: Spouse/significant other Available Help at Discharge: Family;Available 24 hours/day Type of Home: House Home Access: Stairs to enter Entrance Stairs-Rails: Right Entrance Stairs-Number of Steps: 5 Home Layout: One level Home Equipment: Walker - 2 wheels      Prior Function Level of Independence: Independent         Comments: Drives.     Hand Dominance        Extremity/Trunk Assessment   Upper Extremity Assessment Upper Extremity Assessment: Defer to OT evaluation    Lower Extremity Assessment Lower Extremity Assessment: Generalized weakness(but functional)       Communication   Communication: No difficulties  Cognition Arousal/Alertness: Awake/alert Behavior  During Therapy: WFL for tasks assessed/performed Overall Cognitive Status: Within Functional Limits for tasks assessed                                         General Comments General comments (skin integrity, edema, etc.): Family present during session. Pt had 3L/min 02 donned on arrival. Left pt on RA upon departure at rest with Sp02 91%. RN notified.     Exercises     Assessment/Plan    PT Assessment Patent does not need any further PT services  PT Problem List         PT Treatment Interventions      PT Goals (Current goals can be found in the Care Plan section)  Acute Rehab PT Goals Patient Stated Goal: to go home PT Goal Formulation: All assessment and education complete, DC therapy    Frequency     Barriers to discharge        Co-evaluation               AM-PAC PT "6 Clicks" Daily Activity  Outcome Measure Difficulty turning over in bed (including adjusting bedclothes, sheets and blankets)?: A Little Difficulty moving from lying on back to sitting on the side of the bed? : None Difficulty sitting down on and standing up from a chair with arms (e.g., wheelchair, bedside commode, etc,.)?: None Help needed moving to and from a bed to chair (including a wheelchair)?: None Help needed walking in hospital room?: None Help needed climbing 3-5 steps with a railing? : A Little 6 Click Score: 22    End of Session   Activity Tolerance: Patient tolerated treatment well;Treatment limited secondary to medical complications (Comment)(drop in Sp02) Patient left: in bed;with call bell/phone within reach;with family/visitor present Nurse Communication: Mobility status PT Visit Diagnosis: Unsteadiness on feet (R26.81)    Time: 1140-1200 PT Time Calculation (min) (ACUTE ONLY): 20 min   Charges:   PT Evaluation $PT Eval Moderate Complexity: 1 Mod          Wray Kearns, PT, DPT Acute Rehabilitation Services Pager [phone] Office [phone]      Marguarite Arbour A Sabra Heck 10/01/2018, 12:30 PM

## 2018-10-01 NOTE — Progress Notes (Signed)
Patient tolerated ambulating without 02 well.  Ambulated the distance of the hallway with staff and maintained her sats between 89-92.

## 2018-10-01 NOTE — Progress Notes (Signed)
SATURATION QUALIFICATIONS: (This note is used to comply with regulatory documentation for home oxygen)  Patient Saturations on Room Air at Rest = 89%  Patient Saturations on Room Air while Ambulating = 86-88%  Patient Saturations on 2Liters of oxygen while Ambulating =92-95%  Please briefly explain why patient needs home oxygen:

## 2018-10-04 LAB — CULTURE, BLOOD (ROUTINE X 2)
Culture: NO GROWTH
Culture: NO GROWTH

## 2018-11-18 IMAGING — CR DG CHEST 2V
2 series · 2 of 2 positions shown · non-contrast
Comparison: 04/08/2018

CLINICAL DATA: Fatigue, fever, chest pain

EXAM:
CHEST - 2 VIEW

[chest lat]
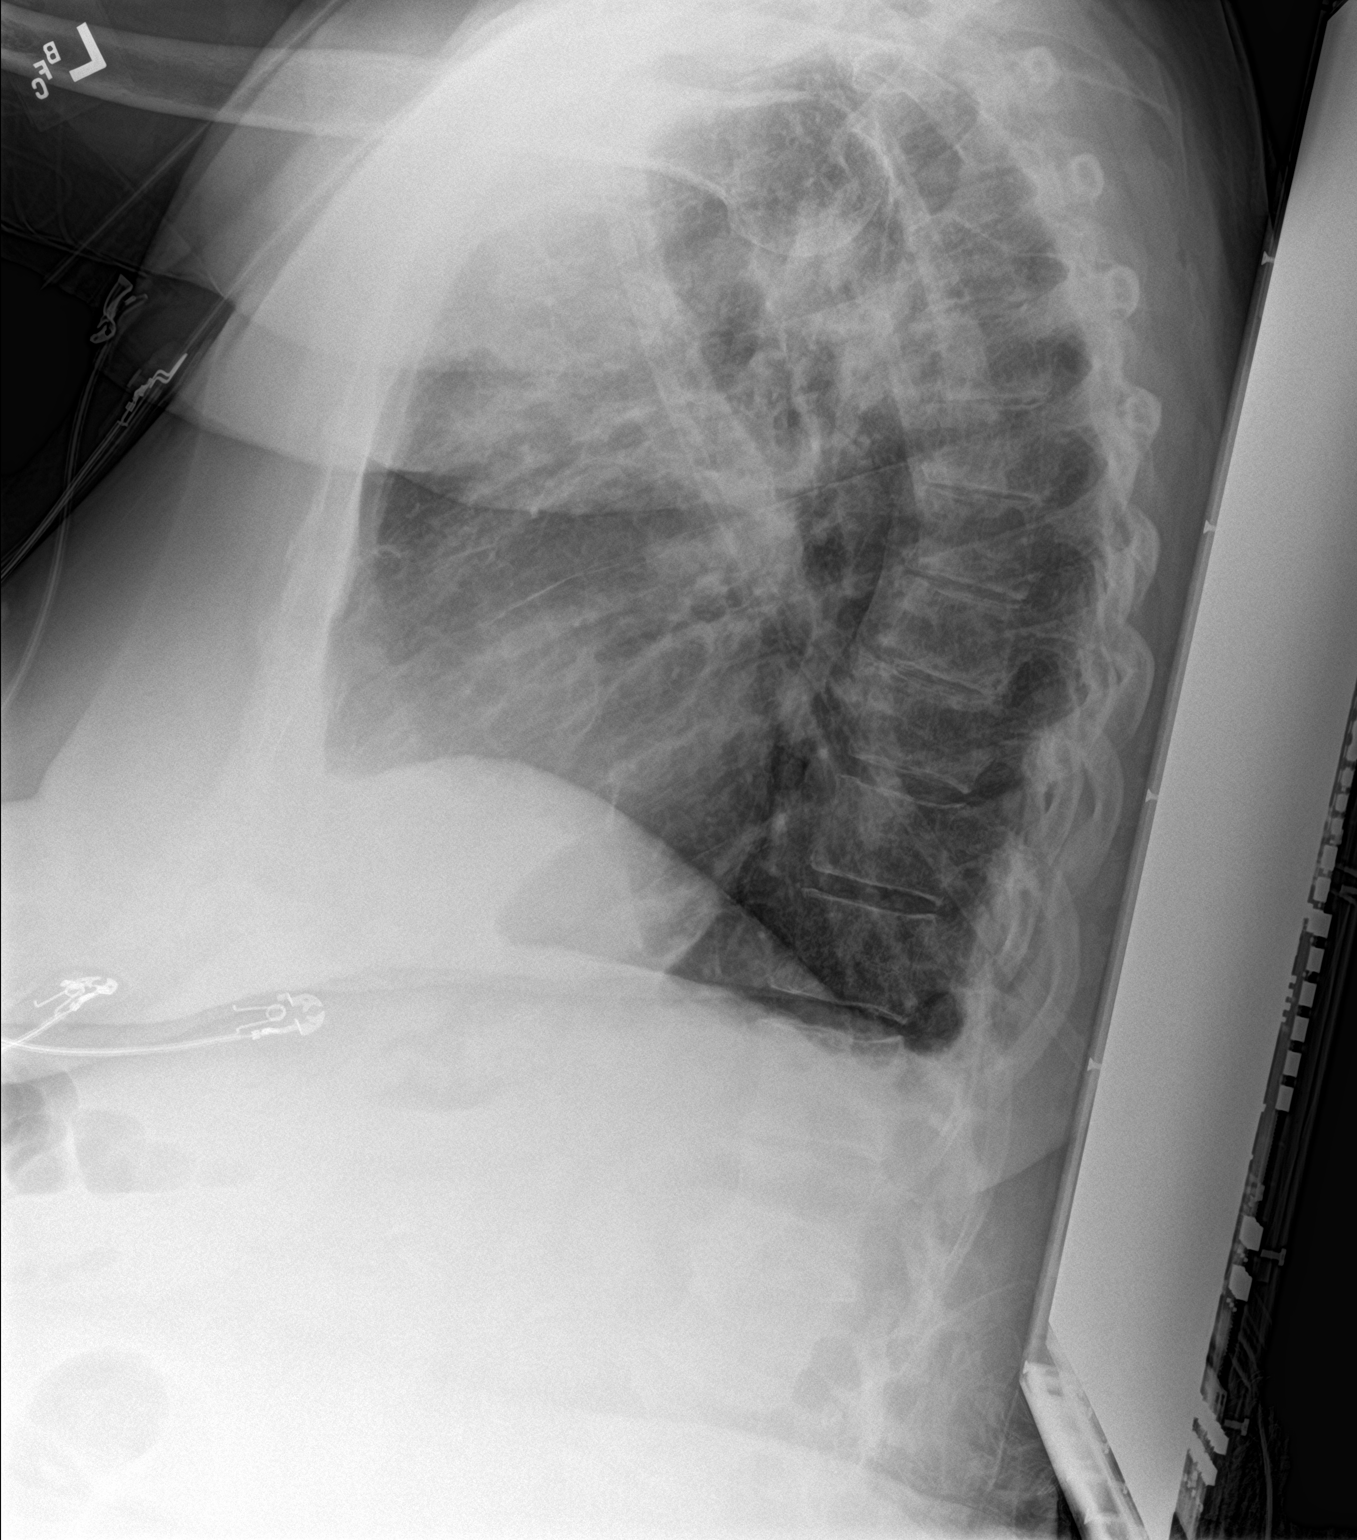

[chest ap]
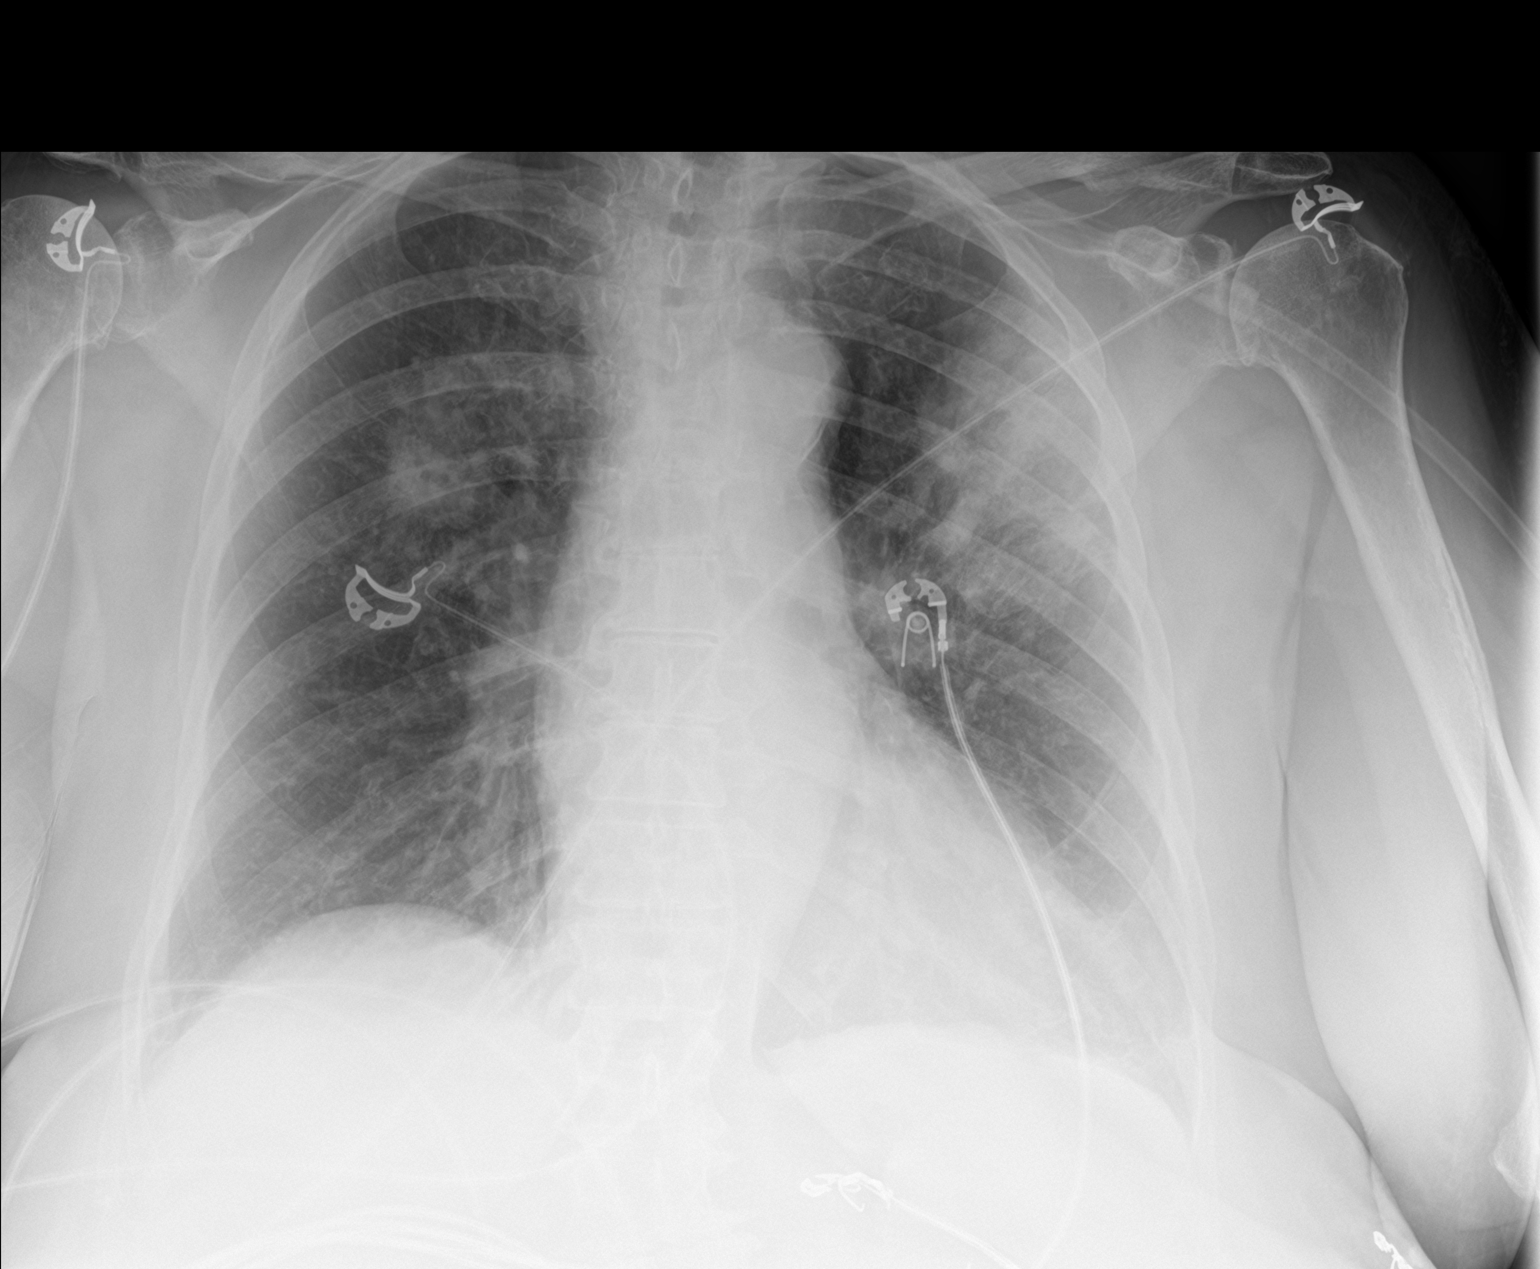

[2 of 2 positions shown; findings below may reference images not displayed]

FINDINGS: Rounded masslike airspace opacities noted in both upper lobes, left
larger than right. Given the these are new since prior study, these
likely reflect pneumonia. Heart is upper limits normal in size. No
effusions or acute bony abnormality.
IMPRESSION: Rounded airspace opacities in the upper lobes bilaterally, new since
prior study, most likely rounded pneumonia. Followup PA and lateral
chest X-ray is recommended in 3-4 weeks following trial of
antibiotic therapy to ensure resolution and exclude underlying
malignancy.

## 2018-12-04 ENCOUNTER — Encounter (HOSPITAL_COMMUNITY): Payer: Self-pay | Admitting: Emergency Medicine

## 2018-12-04 ENCOUNTER — Observation Stay (HOSPITAL_COMMUNITY): Payer: Medicare Other

## 2018-12-04 ENCOUNTER — Other Ambulatory Visit: Payer: Self-pay

## 2018-12-04 ENCOUNTER — Emergency Department (HOSPITAL_COMMUNITY): Payer: Medicare Other

## 2018-12-04 ENCOUNTER — Inpatient Hospital Stay (HOSPITAL_COMMUNITY)
Admission: EM | Admit: 2018-12-04 | Discharge: 2018-12-06 | DRG: 389 | Disposition: A | Discharge status: Home / Self Care | Payer: Medicare Other | Attending: Internal Medicine | Admitting: Internal Medicine

## 2018-12-04 DIAGNOSIS — Z91018 Allergy to other foods: Secondary | ICD-10-CM

## 2018-12-04 DIAGNOSIS — Z885 Allergy status to narcotic agent status: Secondary | ICD-10-CM

## 2018-12-04 DIAGNOSIS — Z9221 Personal history of antineoplastic chemotherapy: Secondary | ICD-10-CM

## 2018-12-04 DIAGNOSIS — K5651 Intestinal adhesions [bands], with partial obstruction: Principal | ICD-10-CM | POA: Diagnosis present

## 2018-12-04 DIAGNOSIS — N179 Acute kidney failure, unspecified: Secondary | ICD-10-CM | POA: Diagnosis present

## 2018-12-04 DIAGNOSIS — Z808 Family history of malignant neoplasm of other organs or systems: Secondary | ICD-10-CM

## 2018-12-04 DIAGNOSIS — Z8701 Personal history of pneumonia (recurrent): Secondary | ICD-10-CM

## 2018-12-04 DIAGNOSIS — Z6837 Body mass index (BMI) 37.0-37.9, adult: Secondary | ICD-10-CM

## 2018-12-04 DIAGNOSIS — E860 Dehydration: Secondary | ICD-10-CM | POA: Diagnosis present

## 2018-12-04 DIAGNOSIS — Z8679 Personal history of other diseases of the circulatory system: Secondary | ICD-10-CM

## 2018-12-04 DIAGNOSIS — Z8543 Personal history of malignant neoplasm of ovary: Secondary | ICD-10-CM

## 2018-12-04 DIAGNOSIS — K56609 Unspecified intestinal obstruction, unspecified as to partial versus complete obstruction: Secondary | ICD-10-CM | POA: Diagnosis present

## 2018-12-04 DIAGNOSIS — Z79899 Other long term (current) drug therapy: Secondary | ICD-10-CM

## 2018-12-04 DIAGNOSIS — R52 Pain, unspecified: Secondary | ICD-10-CM | POA: Diagnosis not present

## 2018-12-04 DIAGNOSIS — K5730 Diverticulosis of large intestine without perforation or abscess without bleeding: Secondary | ICD-10-CM | POA: Diagnosis present

## 2018-12-04 DIAGNOSIS — E119 Type 2 diabetes mellitus without complications: Secondary | ICD-10-CM | POA: Diagnosis present

## 2018-12-04 DIAGNOSIS — D649 Anemia, unspecified: Secondary | ICD-10-CM | POA: Diagnosis present

## 2018-12-04 DIAGNOSIS — Z90710 Acquired absence of both cervix and uterus: Secondary | ICD-10-CM

## 2018-12-04 DIAGNOSIS — M797 Fibromyalgia: Secondary | ICD-10-CM | POA: Diagnosis present

## 2018-12-04 DIAGNOSIS — Z8619 Personal history of other infectious and parasitic diseases: Secondary | ICD-10-CM

## 2018-12-04 DIAGNOSIS — E669 Obesity, unspecified: Secondary | ICD-10-CM | POA: Diagnosis present

## 2018-12-04 DIAGNOSIS — M549 Dorsalgia, unspecified: Secondary | ICD-10-CM

## 2018-12-04 DIAGNOSIS — I10 Essential (primary) hypertension: Secondary | ICD-10-CM

## 2018-12-04 DIAGNOSIS — Z9049 Acquired absence of other specified parts of digestive tract: Secondary | ICD-10-CM

## 2018-12-04 DIAGNOSIS — Z91040 Latex allergy status: Secondary | ICD-10-CM

## 2018-12-04 DIAGNOSIS — K219 Gastro-esophageal reflux disease without esophagitis: Secondary | ICD-10-CM | POA: Diagnosis present

## 2018-12-04 LAB — COMPREHENSIVE METABOLIC PANEL
ALBUMIN: 4.5 g/dL (ref 3.5–5.0)
ALT: 15 U/L (ref 0–44)
ANION GAP: 12 (ref 5–15)
AST: 21 U/L (ref 15–41)
Alkaline Phosphatase: 89 U/L (ref 38–126)
BILIRUBIN TOTAL: 0.8 mg/dL (ref 0.3–1.2)
BUN: 27 mg/dL — ABNORMAL HIGH (ref 8–23)
CHLORIDE: 101 mmol/L (ref 98–111)
CO2: 27 mmol/L (ref 22–32)
Calcium: 9.5 mg/dL (ref 8.9–10.3)
Creatinine, Ser: 1.21 mg/dL — ABNORMAL HIGH (ref 0.44–1.00)
GFR calc Af Amer: 50 mL/min — ABNORMAL LOW (ref >60)
GFR calc non Af Amer: 43 mL/min — ABNORMAL LOW (ref >60)
GLUCOSE: 171 mg/dL — AB (ref 70–99)
POTASSIUM: 3.5 mmol/L (ref 3.5–5.1)
SODIUM: 140 mmol/L (ref 135–145)
TOTAL PROTEIN: 7.7 g/dL (ref 6.5–8.1)

## 2018-12-04 LAB — CBC
HEMATOCRIT: 34.3 % — AB (ref 36.0–46.0)
Hemoglobin: 10.5 g/dL — ABNORMAL LOW (ref 12.0–15.0)
MCH: 26.4 pg (ref 26.0–34.0)
MCHC: 30.6 g/dL (ref 30.0–36.0)
MCV: 86.4 fL (ref 80.0–100.0)
PLATELETS: 244 10*3/uL (ref 150–400)
RBC: 3.97 MIL/uL (ref 3.87–5.11)
RDW: 15.2 % (ref 11.5–15.5)
WBC: 8.4 10*3/uL (ref 4.0–10.5)
nRBC: 0 % (ref 0.0–0.2)

## 2018-12-04 LAB — URINALYSIS, ROUTINE W REFLEX MICROSCOPIC
BILIRUBIN URINE: NEGATIVE
GLUCOSE, UA: NEGATIVE mg/dL
Hgb urine dipstick: NEGATIVE
KETONES UR: 5 mg/dL — AB
LEUKOCYTES UA: NEGATIVE
Nitrite: NEGATIVE
PH: 8 (ref 5.0–8.0)
PROTEIN: NEGATIVE mg/dL
Specific Gravity, Urine: 1.016 (ref 1.005–1.030)

## 2018-12-04 LAB — LIPASE, BLOOD: Lipase: 27 U/L (ref 11–51)

## 2018-12-04 MED ORDER — SODIUM CHLORIDE 0.9 % IV SOLN
INTRAVENOUS | Status: DC
  Administered 2018-12-04 – 2018-12-06 (×3): via INTRAVENOUS

## 2018-12-04 MED ORDER — ENOXAPARIN SODIUM 30 MG/0.3ML ~~LOC~~ SOLN
30.0000 mg | SUBCUTANEOUS | Status: DC
  Administered 2018-12-04: 30 mg via SUBCUTANEOUS
  Filled 2018-12-04: qty 0.3

## 2018-12-04 MED ORDER — HYDRALAZINE HCL 20 MG/ML IJ SOLN: 10.0000 mg | Freq: Four times a day (QID) | INTRAMUSCULAR | Status: DC | PRN

## 2018-12-04 MED ORDER — IOPAMIDOL (ISOVUE-300) INJECTION 61%
80.0000 mL | Freq: Once | INTRAVENOUS | Status: AC | PRN
  Administered 2018-12-04: 80 mL via INTRAVENOUS

## 2018-12-04 MED ORDER — ONDANSETRON HCL 4 MG/2ML IJ SOLN
4.0000 mg | Freq: Once | INTRAMUSCULAR | Status: AC
  Administered 2018-12-04: 4 mg via INTRAVENOUS
  Filled 2018-12-04: qty 2

## 2018-12-04 MED ORDER — SODIUM CHLORIDE 0.9 % IV BOLUS
1000.0000 mL | Freq: Once | INTRAVENOUS | Status: AC
  Administered 2018-12-04: 1000 mL via INTRAVENOUS

## 2018-12-04 MED ORDER — HYDROMORPHONE HCL 1 MG/ML IJ SOLN
0.5000 mg | INTRAMUSCULAR | Status: DC | PRN
  Administered 2018-12-04 (×2): 0.5 mg via INTRAVENOUS
  Filled 2018-12-04 (×2): qty 0.5

## 2018-12-04 MED ORDER — IOPAMIDOL (ISOVUE-300) INJECTION 61%
INTRAVENOUS | Status: AC
  Filled 2018-12-04: qty 100

## 2018-12-04 MED ORDER — FENTANYL CITRATE (PF) 100 MCG/2ML IJ SOLN
50.0000 ug | Freq: Once | INTRAMUSCULAR | Status: AC
  Administered 2018-12-04: 50 ug via INTRAVENOUS
  Filled 2018-12-04: qty 2

## 2018-12-04 MED ORDER — ONDANSETRON HCL 4 MG/2ML IJ SOLN
4.0000 mg | Freq: Once | INTRAMUSCULAR | Status: DC
  Filled 2018-12-04: qty 2

## 2018-12-04 MED ORDER — SODIUM CHLORIDE 0.9 % IV SOLN
Freq: Once | INTRAVENOUS | Status: AC
  Administered 2018-12-04: 100 mL/h via INTRAVENOUS

## 2018-12-04 MED ORDER — SODIUM CHLORIDE (PF) 0.9 % IJ SOLN
INTRAMUSCULAR | Status: AC
  Filled 2018-12-04: qty 50

## 2018-12-04 NOTE — ED Provider Notes (Signed)
[facility] DEPT Provider Note   CSN: [id number] Arrival date & time: 12/04/18  1025     History   Chief Complaint Chief Complaint  Patient presents with  . Abdominal Pain    HPI RICKAYLA WIELAND is a 77 y.o. female.  The history is provided by the patient.  Abdominal Pain   This is a recurrent problem. The current episode started yesterday. The problem occurs constantly. The problem has been gradually worsening. The pain is associated with an unknown factor. The pain is located in the generalized abdominal region. The quality of the pain is aching and dull. The pain is at a severity of 7/10. The pain is moderate. Associated symptoms include anorexia, nausea and vomiting. Pertinent negatives include fever, diarrhea, flatus, dysuria, frequency, hematuria, headaches and arthralgias. Nothing aggravates the symptoms. Nothing relieves the symptoms. Past workup includes CT scan and surgery. Past workup comments: hx of SBO.    Past Medical History:  Diagnosis Date  . 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 Chronic headaches   . Diabetes mellitus   . Diverticulosis of sigmoid colon 04/02/2008   Dr. Oretha Ellis  . Fibromyalgia   . GERD (gastroesophageal reflux disease)   . Hypertension   . Osteoarthritis   . Ovarian cancer ([place])   . PONV (postoperative nausea and vomiting)     Patient Active Problem List   Diagnosis Date Noted  . SBO (small bowel obstruction) ([place]) 12/04/2018  . AKI (acute kidney injury) ([place]) 12/04/2018  . HTN (hypertension) 12/04/2018  . Pain   . Legionella pneumonia ([place]) 10/01/2018  . Diabetes mellitus without complication ([place]) 40/07/6760  . Sepsis ([place]) 09/29/2018  . Lobar pneumonia ([place]) 09/29/2018  . Normocytic anemia 09/29/2018  . Acute respiratory failure with hypoxia ([place]) 09/29/2018  . Bilateral upper lobe community acquired pneumonia ([place])   . Small bowel obstruction ([place]) 04/07/2018  . Hypertension 04/07/2018  . GERD  (gastroesophageal reflux disease) 04/07/2018  . Cyclic vomiting syndrome 10/09/2013  . 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 Serum calcium elevated 10/09/2013    Past Surgical History:  Procedure Laterality Date  . ABDOMINAL HYSTERECTOMY     ovarian cancer  . BRAIN SURGERY     aneurysm  . CATARACT EXTRACTION W/PHACO Left 07/11/2018   Procedure: CATARACT EXTRACTION PHACO AND INTRAOCULAR LENS PLACEMENT ([place])  LEFT;  Surgeon: Eulogio Bear, MD;  Location: [facility];  Service: Ophthalmology;  Laterality: Left;  Diabetic - diet controlled  . CATARACT EXTRACTION W/PHACO Right 08/22/2018   Procedure: CATARACT EXTRACTION PHACO AND INTRAOCULAR LENS PLACEMENT ([place]) RIGHT;  Surgeon: Eulogio Bear, MD;  Location: [facility];  Service: Ophthalmology;  Laterality: Right;  . CHOLECYSTECTOMY    . COLONOSCOPY  04/02/2008   Dr. Oretha Ellis, [place].  Marland Kitchen COLONOSCOPY W/ BIOPSIES  09/09/1999   Dr. Durwin Reges Imam  . CRANIECTOMY  08/10/2012   Procedure: CRANIECTOMY FOR TIC DOULOUREUX;  Surgeon: Hosie Spangle, MD;  Location: [facility];  Service: Neurosurgery;  Laterality: Right;  Right Retromastoid Craniectomy with Microvascular Decompression, Decompression of the trigeminal nerve  . ESOPHAGOGASTRODUODENOSCOPY  01/07/2012   Dr. Oretha Ellis, [place].  . HERNIA REPAIR     left inguinal repair and adhesion repair     OB History   No obstetric history on file.      Home Medications    Prior to Admission medications   Medication Sig Start Date End Date Taking? Authorizing Provider  acetaminophen (TYLENOL) 500 MG tablet  Take 500 mg by mouth every 6 (six) hours as needed for moderate pain.    Yes [provider]  amLODipine (NORVASC) 5 MG tablet Take 5 mg by mouth daily.  08/26/17  Yes [provider]  Bilberry, Vaccinium myrtillus, (BILBERRY PO) Take 1 tablet by mouth daily.   Yes [provider]  cetirizine (ZYRTEC) 10 MG tablet Take 10 mg by mouth daily.   Yes  [provider]  Cholecalciferol (VITAMIN D3) 2000 units TABS Take 1 tablet by mouth daily.    Yes [provider]  diphenhydrAMINE (BENADRYL) 25 MG tablet Take 25 mg by mouth every 6 (six) hours as needed for allergies.    Yes [provider]  fluticasone (FLONASE) 50 MCG/ACT nasal spray Place 2 sprays into the nose daily.   Yes [provider]  ibuprofen (ADVIL,MOTRIN) 200 MG tablet Take 200 mg by mouth every 6 (six) hours as needed for mild pain.    Yes [provider]  Magnesium Hydroxide (MILK OF MAGNESIA PO) Take 30 mLs by mouth daily as needed (indigestion).    Yes [provider]  meloxicam (MOBIC) 15 MG tablet Take 15 mg by mouth daily as needed for pain.   Yes [provider]  Multiple Vitamin (MULTIVITAMIN WITH MINERALS) TABS Take 1 tablet by mouth daily.   Yes [provider]  pantoprazole (PROTONIX) 40 MG tablet Take 40 mg by mouth daily as needed. Heart Burn   Yes [provider]    Family History Family History  Problem Relation Age of Onset  . Mesothelioma Sister   . Bone cancer Sister   . Cancer Brother        unknown  . Colon cancer Neg Hx   . Esophageal cancer Neg Hx     Social History Social History   Tobacco Use  . Smoking status: Never Smoker  . Smokeless tobacco: Never Used  Substance Use Topics  . Alcohol use: Yes    Comment: wine rarely  . Drug use: No     Allergies   Cabbage; Latex; and Morphine and related   Review of Systems Review of Systems  Constitutional: Negative for chills and fever.  HENT: Negative for ear pain and sore throat.   Eyes: Negative for pain and visual disturbance.  Respiratory: Negative for cough and shortness of breath.   Cardiovascular: Negative for chest pain and palpitations.  Gastrointestinal: Positive for abdominal pain, anorexia, nausea and vomiting. Negative for diarrhea and flatus.  Genitourinary: Negative for dysuria, frequency and  hematuria.  Musculoskeletal: Negative for arthralgias and back pain.  Skin: Negative for color change and rash.  Neurological: Negative for seizures, syncope and headaches.  All other systems reviewed and are negative.    Physical Exam Updated Vital Signs  ED Triage Vitals  Enc Vitals Group     BP 12/04/18 1031 137/78     Pulse Rate 12/04/18 1031 88     Resp 12/04/18 1031 17     Temp 12/04/18 1031 97.6 F (36.4 C)     Temp Source 12/04/18 1031 Oral     SpO2 12/04/18 1031 100 %     Weight --      Height --      Head Circumference --      Peak Flow --      Pain Score 12/04/18 1028 8     Pain Loc --      Pain Edu? --      Excl. in  GC? --     Physical Exam Vitals signs and nursing note reviewed.  Constitutional:      General: She is not in acute distress.    Appearance: She is well-developed.  HENT:     Head: Normocephalic and atraumatic.  Eyes:     Conjunctiva/sclera: Conjunctivae normal.  Neck:     Musculoskeletal: Neck supple.  Cardiovascular:     Rate and Rhythm: Normal rate and regular rhythm.     Heart sounds: No murmur.  Pulmonary:     Effort: Pulmonary effort is normal. No respiratory distress.     Breath sounds: Normal breath sounds.  Abdominal:     General: Bowel sounds are normal. There is distension.     Palpations: Abdomen is soft.     Tenderness: There is generalized abdominal tenderness. There is no right CVA tenderness, left CVA tenderness, guarding or rebound. Negative signs include Murphy's sign.     Hernia: No hernia is present.  Skin:    General: Skin is warm and dry.  Neurological:     Mental Status: She is alert.      ED Treatments / Results  Labs (all labs ordered are listed, but only abnormal results are displayed) Labs Reviewed  COMPREHENSIVE METABOLIC PANEL - Abnormal; Notable for the following components:      Result Value   Glucose, Bld 171 (*)    BUN 27 (*)    Creatinine, Ser 1.21 (*)    GFR calc non Af Amer 43 (*)    GFR  calc Af Amer 50 (*)    All other components within normal limits  CBC - Abnormal; Notable for the following components:   Hemoglobin 10.5 (*)    HCT 34.3 (*)    All other components within normal limits  URINALYSIS, ROUTINE W REFLEX MICROSCOPIC - Abnormal; Notable for the following components:   APPearance CLOUDY (*)    Ketones, ur 5 (*)    All other components within normal limits  LIPASE, BLOOD    EKG None  Radiology Ct Abdomen Pelvis W Contrast  Result Date: 12/04/2018 CLINICAL DATA:  77 year old female with history of lower abdominal pain. History of cancer. EXAM: CT ABDOMEN AND PELVIS WITH CONTRAST TECHNIQUE: Multidetector CT imaging of the abdomen and pelvis was performed using the standard protocol following bolus administration of intravenous contrast. CONTRAST:  3mL ISOVUE-300 IOPAMIDOL (ISOVUE-300) INJECTION 61% COMPARISON:  CT the abdomen and pelvis 04/07/2018. FINDINGS: Lower chest: 2 mm subpleural nodule in the periphery of the right lower lobe (axial image 27 of series 5), stable dating back to 07/23/2017, considered definitively benign, presumably a subpleural lymph node. Aortic atherosclerosis. Hepatobiliary: No suspicious cystic or solid hepatic lesions. Status post cholecystectomy. Mild intrahepatic biliary ductal dilatation. Common bile duct measures up to 16 mm in the porta hepatis, similar to the prior examination, presumably related to benign post cholecystectomy physiology. Pancreas: No pancreatic mass. No pancreatic ductal dilatation. No pancreatic or peripancreatic fluid or inflammatory changes. Spleen: Unremarkable. Adrenals/Urinary Tract: Subcentimeter low-attenuation lesion in the interpolar region of the right kidney, too small to characterize, but statistically likely a cyst. Left kidney and bilateral adrenal glands are normal in appearance. No hydroureteronephrosis. Urinary bladder is normal in appearance. Stomach/Bowel: Normal appearance of the stomach. Numerous  colonic diverticulae are noted, without surrounding inflammatory changes to suggest an acute diverticulitis at this time. Numerous borderline dilated and mildly dilated loops of mid to distal small bowel measuring up to 4.8 cm in diameter. Several air-fluid levels in  these regions. Distal ileum is decompressed. No discrete transition point is confidently identified. The appendix is not confidently identified and may be surgically absent. Regardless, there are no inflammatory changes noted adjacent to the cecum to suggest the presence of an acute appendicitis at this time. Vascular/Lymphatic: Aortic atherosclerosis, without evidence of aneurysm or dissection in the abdominal or pelvic vasculature. No lymphadenopathy noted in the abdomen or pelvis. Reproductive: Status post hysterectomy. Ovaries are not confidently identified may be surgically absent are trophic. Other: No significant volume of ascites. No pneumoperitoneum. Pelvic floor laxity. Musculoskeletal: There are no aggressive appearing lytic or blastic lesions noted in the visualized portions of the skeleton. IMPRESSION: 1. Multiple borderline and mildly dilated loops of mid small bowel. Decompression of the distal ileum and colon. Findings are concerning for early or partial small bowel obstruction. No discrete transition point is confidently identified at this time. Additionally, stomach and or proximal small bowel do not appear dilated at this time. 2. Colonic diverticulosis without evidence of acute diverticulitis at this time. 3. Aortic atherosclerosis. 4. Additional incidental findings, as above. Electronically Signed   By: Vinnie Langton M.D.   On: 12/04/2018 14:12    Procedures Procedures (including critical care time)  Medications Ordered in ED Medications  iopamidol (ISOVUE-300) 61 % injection (has no administration in time range)  sodium chloride (PF) 0.9 % injection (has no administration in time range)  ondansetron (ZOFRAN) injection  4 mg (has no administration in time range)  enoxaparin (LOVENOX) injection 30 mg (has no administration in time range)  0.9 %  sodium chloride infusion (has no administration in time range)  hydrALAZINE (APRESOLINE) injection 10 mg (has no administration in time range)  HYDROmorphone (DILAUDID) injection 0.5 mg (has no administration in time range)  sodium chloride 0.9 % bolus 1,000 mL (0 mLs Intravenous Stopped 12/04/18 1333)  fentaNYL (SUBLIMAZE) injection 50 mcg (50 mcg Intravenous Given 12/04/18 1218)  ondansetron (ZOFRAN) injection 4 mg (4 mg Intravenous Given 12/04/18 1306)  iopamidol (ISOVUE-300) 61 % injection 80 mL (80 mLs Intravenous Contrast Given 12/04/18 1329)  0.9 %  sodium chloride infusion (100 mL/hr Intravenous New Bag/Given 12/04/18 1602)     Initial Impression / Assessment and Plan / ED Course  I have reviewed the triage vital signs and the nursing notes.  Pertinent labs & imaging results that were available during my care of the patient were reviewed by me and considered in my medical decision making (see chart for details).     MICKAELA STARLIN is a 77 year old female history of hypertension, reflux, ovarian cancer who presents to the ED with abdominal pain.  Patient with normal vitals.  No fever.  Patient with pain for the last 2 days.  States that it feels like her prior small bowel obstructions.  She has had nausea, vomiting.  Patient is not passing gas.  She is distended on exam with mild tenderness throughout.  Denies any urinary symptoms.  Will get labs, CT scan.  Given IV fluids.  Given IV pain medicine.  Patient with CT scan that shows developing small bowel obstruction.  Patient had some improvement following fluids, pain medicine but continues to not pass gas.  Continues to feel nauseous.  General surgery was consulted and will evaluate the patient.  Patient to be admitted to the hospitalist for further care.  She had no significant anemia, electrolyte  abnormality, kidney injury.  No signs of urinary tract infection.  Patient admitted in stable condition.  This chart was dictated  using voice recognition software.  Despite best efforts to proofread,  errors can occur which can change the documentation meaning.   Final Clinical Impressions(s) / ED Diagnoses   Final diagnoses:  SBO (small bowel obstruction) [facility])    ED Discharge Orders    None       Lennice Sites, DO 12/04/18 1621

## 2018-12-04 NOTE — ED Notes (Signed)
ED TO INPATIENT HANDOFF REPORT  Name/Age/Gender Tonya Miranda 77 y.o. female  Code Status    Code Status Orders  (From admission, onward)         Start     Ordered   12/04/18 1552  Full code  Continuous     12/04/18 1552        Code Status History    Date Active Date Inactive Code Status Order ID Comments User Context   09/29/2018 0342 10/01/2018 2030 Full Code [id number]  Ivor Costa, MD ED   04/07/2018 1714 04/11/2018 1618 Full Code [id number]  Annita Brod, MD Inpatient   08/10/2012 1512 08/13/2012 1323 Full Code [id number]  Latrelle Dodrill, RN Inpatient      Home/SNF/Other Home  Chief Complaint abd pain  Level of Care/Admitting Diagnosis ED Disposition    ED Disposition Condition [facility] Area: [facility] [[id number]]  Level of Care: Med-Surg [16]  Diagnosis: SBO (small bowel obstruction) [facility]) [[id number]]  Admitting Physician: Georgette Shell [[id number]]  Attending Physician: Georgette Shell [phone]  PT Class (Do Not Modify): Observation [104]  PT Acc Code (Do Not Modify): Observation [10022]       Medical History Past Medical History:  Diagnosis Date  . 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 Chronic headaches   . Diabetes mellitus   . Diverticulosis of sigmoid colon 04/02/2008   Dr. Oretha Ellis  . Fibromyalgia   . GERD (gastroesophageal reflux disease)   . Hypertension   . Osteoarthritis   . Ovarian cancer ([place])   . PONV (postoperative nausea and vomiting)     Allergies Allergies  Allergen Reactions  . Cabbage Other (See Comments)    congestion  . Latex Dermatitis    (tape)  . Morphine And Related     Hallucinations, headache    IV Location/Drains/Wounds Patient Lines/Drains/Airways Status   Active Line/Drains/Airways    Name:   Placement date:   Placement time:   Site:   Days:   Peripheral IV 12/04/18 Right;Proximal Forearm   12/04/18    1158    Forearm   less than 1           Labs/Imaging Results for orders placed or performed during the hospital encounter of 12/04/18 (from the past 48 hour(s))  Lipase, blood     Status: None   Collection Time: 12/04/18 11:32 AM  Result Value Ref Range   Lipase 27 11 - 51 U/L    Comment: Performed at [facility], [place] [address], [place], [place] [address]  Comprehensive metabolic panel     Status: Abnormal   Collection Time: 12/04/18 11:32 AM  Result Value Ref Range   Sodium 140 135 - 145 mmol/L   Potassium 3.5 3.5 - 5.1 mmol/L   Chloride 101 98 - 111 mmol/L   CO2 27 22 - 32 mmol/L   Glucose, Bld 171 (H) 70 - 99 mg/dL   BUN 27 (H) 8 - 23 mg/dL   Creatinine, Ser 1.21 (H) 0.44 - 1.00 mg/dL   Calcium 9.5 8.9 - 10.3 mg/dL   Total Protein 7.7 6.5 - 8.1 g/dL   Albumin 4.5 3.5 - 5.0 g/dL   AST 21 15 - 41 U/L   ALT 15 0 - 44 U/L   Alkaline Phosphatase 89 38 - 126 U/L   Total Bilirubin 0.8 0.3 - 1.2 mg/dL   GFR calc non Af Amer 43 (L) >60 mL/min  GFR calc Af Amer 50 (L) >60 mL/min   Anion gap 12 5 - 15    Comment: Performed at [facility], [place] [address] [place], [place] [address]  CBC     Status: Abnormal   Collection Time: 12/04/18 11:32 AM  Result Value Ref Range   WBC 8.4 4.0 - 10.5 K/uL   RBC 3.97 3.87 - 5.11 MIL/uL   Hemoglobin 10.5 (L) 12.0 - 15.0 g/dL   HCT 34.3 (L) 36.0 - 46.0 %   MCV 86.4 80.0 - 100.0 fL   MCH 26.4 26.0 - 34.0 pg   MCHC 30.6 30.0 - 36.0 g/dL   RDW 15.2 11.5 - 15.5 %   Platelets 244 150 - 400 K/uL   nRBC 0.0 0.0 - 0.2 %    Comment: Performed at [facility], [place] [address], [place], [place] [address]  Urinalysis, Routine w reflex microscopic     Status: Abnormal   Collection Time: 12/04/18 11:32 AM  Result Value Ref Range   Color, Urine YELLOW YELLOW   APPearance CLOUDY (A) CLEAR   Specific Gravity, Urine 1.016 1.005 - 1.030   pH 8.0 5.0 - 8.0   Glucose, UA NEGATIVE NEGATIVE mg/dL   Hgb urine dipstick NEGATIVE NEGATIVE    Bilirubin Urine NEGATIVE NEGATIVE   Ketones, ur 5 (A) NEGATIVE mg/dL   Protein, ur NEGATIVE NEGATIVE mg/dL   Nitrite NEGATIVE NEGATIVE   Leukocytes, UA NEGATIVE NEGATIVE    Comment: Performed at [facility] [address], [place], [place] [address]   Ct Abdomen Pelvis W Contrast  Result Date: 12/04/2018 CLINICAL DATA:  77 year old female with history of lower abdominal pain. History of cancer. EXAM: CT ABDOMEN AND PELVIS WITH CONTRAST TECHNIQUE: Multidetector CT imaging of the abdomen and pelvis was performed using the standard protocol following bolus administration of intravenous contrast. CONTRAST:  47mL ISOVUE-300 IOPAMIDOL (ISOVUE-300) INJECTION 61% COMPARISON:  CT the abdomen and pelvis 04/07/2018. FINDINGS: Lower chest: 2 mm subpleural nodule in the periphery of the right lower lobe (axial image 27 of series 5), stable dating back to 07/23/2017, considered definitively benign, presumably a subpleural lymph node. Aortic atherosclerosis. Hepatobiliary: No suspicious cystic or solid hepatic lesions. Status post cholecystectomy. Mild intrahepatic biliary ductal dilatation. Common bile duct measures up to 16 mm in the porta hepatis, similar to the prior examination, presumably related to benign post cholecystectomy physiology. Pancreas: No pancreatic mass. No pancreatic ductal dilatation. No pancreatic or peripancreatic fluid or inflammatory changes. Spleen: Unremarkable. Adrenals/Urinary Tract: Subcentimeter low-attenuation lesion in the interpolar region of the right kidney, too small to characterize, but statistically likely a cyst. Left kidney and bilateral adrenal glands are normal in appearance. No hydroureteronephrosis. Urinary bladder is normal in appearance. Stomach/Bowel: Normal appearance of the stomach. Numerous colonic diverticulae are noted, without surrounding inflammatory changes to suggest an acute diverticulitis at this time. Numerous borderline dilated and mildly  dilated loops of mid to distal small bowel measuring up to 4.8 cm in diameter. Several air-fluid levels in these regions. Distal ileum is decompressed. No discrete transition point is confidently identified. The appendix is not confidently identified and may be surgically absent. Regardless, there are no inflammatory changes noted adjacent to the cecum to suggest the presence of an acute appendicitis at this time. Vascular/Lymphatic: Aortic atherosclerosis, without evidence of aneurysm or dissection in the abdominal or pelvic vasculature. No lymphadenopathy noted in the abdomen or pelvis. Reproductive: Status post hysterectomy. Ovaries are not confidently identified may be surgically  absent are trophic. Other: No significant volume of ascites. No pneumoperitoneum. Pelvic floor laxity. Musculoskeletal: There are no aggressive appearing lytic or blastic lesions noted in the visualized portions of the skeleton. IMPRESSION: 1. Multiple borderline and mildly dilated loops of mid small bowel. Decompression of the distal ileum and colon. Findings are concerning for early or partial small bowel obstruction. No discrete transition point is confidently identified at this time. Additionally, stomach and or proximal small bowel do not appear dilated at this time. 2. Colonic diverticulosis without evidence of acute diverticulitis at this time. 3. Aortic atherosclerosis. 4. Additional incidental findings, as above. Electronically Signed   By: Vinnie Langton M.D.   On: 12/04/2018 14:12   None  Pending Labs Unresulted Labs (From admission, onward)    Start     Ordered   12/05/18 7121  Basic metabolic panel  Tomorrow morning,   R     12/04/18 1604   12/05/18 0500  Magnesium  Tomorrow morning,   R     12/04/18 1604   12/05/18 0500  CBC  Tomorrow morning,   R     12/04/18 1604          Vitals/Pain Today's Vitals   12/04/18 1245 12/04/18 1247 12/04/18 1300 12/04/18 1630  BP: 135/72  135/63 132/69  Pulse:   77 77   Resp:    18  Temp:      TempSrc:      SpO2:   97% 96%  PainSc:  5       Isolation Precautions No active isolations  Medications Medications  iopamidol (ISOVUE-300) 61 % injection (has no administration in time range)  sodium chloride (PF) 0.9 % injection (has no administration in time range)  ondansetron (ZOFRAN) injection 4 mg (4 mg Intravenous Refused 12/04/18 1624)  enoxaparin (LOVENOX) injection 30 mg (has no administration in time range)  0.9 %  sodium chloride infusion (has no administration in time range)  hydrALAZINE (APRESOLINE) injection 10 mg (has no administration in time range)  HYDROmorphone (DILAUDID) injection 0.5 mg (has no administration in time range)  sodium chloride 0.9 % bolus 1,000 mL (0 mLs Intravenous Stopped 12/04/18 1333)  fentaNYL (SUBLIMAZE) injection 50 mcg (50 mcg Intravenous Given 12/04/18 1218)  ondansetron (ZOFRAN) injection 4 mg (4 mg Intravenous Given 12/04/18 1306)  iopamidol (ISOVUE-300) 61 % injection 80 mL (80 mLs Intravenous Contrast Given 12/04/18 1329)  0.9 %  sodium chloride infusion (100 mL/hr Intravenous New Bag/Given 12/04/18 1602)    Mobility walks

## 2018-12-04 NOTE — Consult Note (Signed)
Reason for Consult: Abdominal pain, SBO  Referring Physician: Lennice Sites EDP  Tonya Miranda is an 77 y.o. female.   HPI: Asked to evaluate Tonya Miranda for abdominal pain and evidence of SBO.  Patient has a previous abdominal surgical history of TAH and BSO followed by chemotherapy for ovarian cancer.  This was removed.  She is also had cholecystectomy.  She gives a history of several episodes of small bowel obstruction in the past.  She has had at least one laparotomy for bowel obstruction per the patient's history.  This was a number of years ago.  She was admitted as recently as May of this year with partial small bowel obstruction that resolved quickly.  In terms of her GI tract she has done well since May until yesterday.  She developed the onset of crampy mid abdominal pain similar to what she has had previously.  This was fairly severe overnight and she presented to the emergency department this morning.  She vomited once after arriving in the emergency department.  No flatus or bowel movement since yesterday.  Fever chills.  She states she feels currently significantly better than on arrival with less pain and less tightness in her abdomen.  Denies nausea currently.  Past Medical History:  Diagnosis Date  . 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 Chronic headaches   . Diabetes mellitus   . Diverticulosis of sigmoid colon 04/02/2008   Dr. Oretha Ellis  . Fibromyalgia   . GERD (gastroesophageal reflux disease)   . Hypertension   . Osteoarthritis   . Ovarian cancer ([place])   . PONV (postoperative nausea and vomiting)     Past Surgical History:  Procedure Laterality Date  . ABDOMINAL HYSTERECTOMY     ovarian cancer  . BRAIN SURGERY     aneurysm  . CATARACT EXTRACTION W/PHACO Left 07/11/2018   Procedure: CATARACT EXTRACTION PHACO AND INTRAOCULAR LENS PLACEMENT ([place])  LEFT;  Surgeon: Eulogio Bear, MD;  Location: [place];  Service: Ophthalmology;  Laterality: Left;   Diabetic - diet controlled  . CATARACT EXTRACTION W/PHACO Right 08/22/2018   Procedure: CATARACT EXTRACTION PHACO AND INTRAOCULAR LENS PLACEMENT ([place]) RIGHT;  Surgeon: Eulogio Bear, MD;  Location: [place];  Service: Ophthalmology;  Laterality: Right;  . CHOLECYSTECTOMY    . COLONOSCOPY  04/02/2008   Dr. Oretha Ellis, [place].  Marland Kitchen COLONOSCOPY W/ BIOPSIES  09/09/1999   Dr. Durwin Reges Imam  . CRANIECTOMY  08/10/2012   Procedure: CRANIECTOMY FOR TIC DOULOUREUX;  Surgeon: Hosie Spangle, MD;  Location: [place] [facility];  Service: Neurosurgery;  Laterality: Right;  Right Retromastoid Craniectomy with Microvascular Decompression, Decompression of the trigeminal nerve  . ESOPHAGOGASTRODUODENOSCOPY  01/07/2012   Dr. Oretha Ellis, [place].  . HERNIA REPAIR     left inguinal repair and adhesion repair    Family History  Problem Relation Age of Onset  . Mesothelioma Sister   . Bone cancer Sister   . Cancer Brother        unknown  . Colon cancer Neg Hx   . Esophageal cancer Neg Hx     Social History:  reports that she has never smoked. She has never used smokeless tobacco. She reports current alcohol use. She reports that she does not use drugs.  Allergies:  Allergies  Allergen Reactions  . Cabbage Other (See Comments)    congestion  . Latex Dermatitis    (tape)  . Morphine And Related     Hallucinations,  headache    Current Facility-Administered Medications  Medication Dose Route Frequency Provider Last Rate Last Dose  . 0.9 %  sodium chloride infusion   Intravenous Once Curatolo, Adam, DO      . iopamidol (ISOVUE-300) 61 % injection           . ondansetron (ZOFRAN) injection 4 mg  4 mg Intravenous Once Curatolo, Adam, DO      . sodium chloride (PF) 0.9 % injection            Current Outpatient Medications  Medication Sig Dispense Refill  . acetaminophen (TYLENOL) 500 MG tablet Take 500 mg by mouth every 6 (six) hours as needed for moderate pain.     Marland Kitchen amLODipine (NORVASC) 5 MG  tablet Take 5 mg by mouth daily.     . Bilberry, Vaccinium myrtillus, (BILBERRY PO) Take 1 tablet by mouth daily.    . cetirizine (ZYRTEC) 10 MG tablet Take 10 mg by mouth daily.    . Cholecalciferol (VITAMIN D3) 2000 units TABS Take 1 tablet by mouth daily.     . diphenhydrAMINE (BENADRYL) 25 MG tablet Take 25 mg by mouth every 6 (six) hours as needed for allergies.     . fluticasone (FLONASE) 50 MCG/ACT nasal spray Place 2 sprays into the nose daily.    Marland Kitchen ibuprofen (ADVIL,MOTRIN) 200 MG tablet Take 200 mg by mouth every 6 (six) hours as needed for mild pain.     . Magnesium Hydroxide (MILK OF MAGNESIA PO) Take 30 mLs by mouth daily as needed (indigestion).     . meloxicam (MOBIC) 15 MG tablet Take 15 mg by mouth daily as needed for pain.    . Multiple Vitamin (MULTIVITAMIN WITH MINERALS) TABS Take 1 tablet by mouth daily.    . pantoprazole (PROTONIX) 40 MG tablet Take 40 mg by mouth daily as needed. Heart Burn       Results for orders placed or performed during the hospital encounter of 12/04/18 (from the past 48 hour(s))  Lipase, blood     Status: None   Collection Time: 12/04/18 11:32 AM  Result Value Ref Range   Lipase 27 11 - 51 U/L    Comment: Performed at [facility], [place] [address] [place], [place] [address]  Comprehensive metabolic panel     Status: Abnormal   Collection Time: 12/04/18 11:32 AM  Result Value Ref Range   Sodium 140 135 - 145 mmol/L   Potassium 3.5 3.5 - 5.1 mmol/L   Chloride 101 98 - 111 mmol/L   CO2 27 22 - 32 mmol/L   Glucose, Bld 171 (H) 70 - 99 mg/dL   BUN 27 (H) 8 - 23 mg/dL   Creatinine, Ser 1.21 (H) 0.44 - 1.00 mg/dL   Calcium 9.5 8.9 - 10.3 mg/dL   Total Protein 7.7 6.5 - 8.1 g/dL   Albumin 4.5 3.5 - 5.0 g/dL   AST 21 15 - 41 U/L   ALT 15 0 - 44 U/L   Alkaline Phosphatase 89 38 - 126 U/L   Total Bilirubin 0.8 0.3 - 1.2 mg/dL   GFR calc non Af Amer 43 (L) >60 mL/min   GFR calc Af Amer 50 (L) >60 mL/min   Anion gap 12 5 - 15     Comment: Performed at [facility], [place] [address], [place], [place] [address]  CBC     Status: Abnormal   Collection Time: 12/04/18 11:32 AM  Result Value Ref Range  WBC 8.4 4.0 - 10.5 K/uL   RBC 3.97 3.87 - 5.11 MIL/uL   Hemoglobin 10.5 (L) 12.0 - 15.0 g/dL   HCT 34.3 (L) 36.0 - 46.0 %   MCV 86.4 80.0 - 100.0 fL   MCH 26.4 26.0 - 34.0 pg   MCHC 30.6 30.0 - 36.0 g/dL   RDW 15.2 11.5 - 15.5 %   Platelets 244 150 - 400 K/uL   nRBC 0.0 0.0 - 0.2 %    Comment: Performed at [facility], [place] [address] [place], [place] [address]  Urinalysis, Routine w reflex microscopic     Status: Abnormal   Collection Time: 12/04/18 11:32 AM  Result Value Ref Range   Color, Urine YELLOW YELLOW   APPearance CLOUDY (A) CLEAR   Specific Gravity, Urine 1.016 1.005 - 1.030   pH 8.0 5.0 - 8.0   Glucose, UA NEGATIVE NEGATIVE mg/dL   Hgb urine dipstick NEGATIVE NEGATIVE   Bilirubin Urine NEGATIVE NEGATIVE   Ketones, ur 5 (A) NEGATIVE mg/dL   Protein, ur NEGATIVE NEGATIVE mg/dL   Nitrite NEGATIVE NEGATIVE   Leukocytes, UA NEGATIVE NEGATIVE    Comment: Performed at [place] [address] [place], [place] [address]    Ct Abdomen Pelvis W Contrast  Result Date: 12/04/2018 CLINICAL DATA:  77 year old female with history of lower abdominal pain. History of cancer. EXAM: CT ABDOMEN AND PELVIS WITH CONTRAST TECHNIQUE: Multidetector CT imaging of the abdomen and pelvis was performed using the standard protocol following bolus administration of intravenous contrast. CONTRAST:  61mL ISOVUE-300 IOPAMIDOL (ISOVUE-300) INJECTION 61% COMPARISON:  CT the abdomen and pelvis 04/07/2018. FINDINGS: Lower chest: 2 mm subpleural nodule in the periphery of the right lower lobe (axial image 27 of series 5), stable dating back to 07/23/2017, considered definitively benign, presumably a subpleural lymph node. Aortic atherosclerosis. Hepatobiliary: No suspicious  cystic or solid hepatic lesions. Status post cholecystectomy. Mild intrahepatic biliary ductal dilatation. Common bile duct measures up to 16 mm in the porta hepatis, similar to the prior examination, presumably related to benign post cholecystectomy physiology. Pancreas: No pancreatic mass. No pancreatic ductal dilatation. No pancreatic or peripancreatic fluid or inflammatory changes. Spleen: Unremarkable. Adrenals/Urinary Tract: Subcentimeter low-attenuation lesion in the interpolar region of the right kidney, too small to characterize, but statistically likely a cyst. Left kidney and bilateral adrenal glands are normal in appearance. No hydroureteronephrosis. Urinary bladder is normal in appearance. Stomach/Bowel: Normal appearance of the stomach. Numerous colonic diverticulae are noted, without surrounding inflammatory changes to suggest an acute diverticulitis at this time. Numerous borderline dilated and mildly dilated loops of mid to distal small bowel measuring up to 4.8 cm in diameter. Several air-fluid levels in these regions. Distal ileum is decompressed. No discrete transition point is confidently identified. The appendix is not confidently identified and may be surgically absent. Regardless, there are no inflammatory changes noted adjacent to the cecum to suggest the presence of an acute appendicitis at this time. Vascular/Lymphatic: Aortic atherosclerosis, without evidence of aneurysm or dissection in the abdominal or pelvic vasculature. No lymphadenopathy noted in the abdomen or pelvis. Reproductive: Status post hysterectomy. Ovaries are not confidently identified may be surgically absent are trophic. Other: No significant volume of ascites. No pneumoperitoneum. Pelvic floor laxity. Musculoskeletal: There are no aggressive appearing lytic or blastic lesions noted in the visualized portions of the skeleton. IMPRESSION: 1. Multiple borderline and mildly dilated loops of mid small bowel. Decompression of  the distal ileum and colon. Findings are concerning  for early or partial small bowel obstruction. No discrete transition point is confidently identified at this time. Additionally, stomach and or proximal small bowel do not appear dilated at this time. 2. Colonic diverticulosis without evidence of acute diverticulitis at this time. 3. Aortic atherosclerosis. 4. Additional incidental findings, as above. Electronically Signed   By: Vinnie Langton M.D.   On: 12/04/2018 14:12    Review of Systems  Constitutional: Negative for chills and fever.  Respiratory: Negative.   Cardiovascular: Negative.   Gastrointestinal: Positive for abdominal pain, nausea and vomiting. Negative for constipation and diarrhea.  Genitourinary: Negative.    Blood pressure 135/63, pulse 77, temperature 97.6 F (36.4 C), temperature source Oral, resp. rate 19, SpO2 97 %. Physical Exam General: Alert, pleasant, obese Caucasian female, in no distress Skin: Warm and dry without rash or infection. HEENT: No palpable masses or thyromegaly. Sclera nonicteric. Pupils equal round and reactive.  Lymph nodes: No cervical, supraclavicular, or inguinal nodes palpable. Lungs: Breath sounds clear and equal without increased work of breathing Cardiovascular: Regular rate and rhythm without murmur. No JVD or edema. Peripheral pulses intact. Abdomen: Obese.  Possibly mildly distended.  Several centimeter soft reducible incisional hernia mid abdomen.  Soft and nontender. No masses palpable. No organomegaly.  Extremities: No edema or joint swelling or deformity. No chronic venous stasis changes. Neurologic: Alert and fully oriented.  Affect normal.  No gross motor deficits.  Assessment/Plan: Patient with previous extensive abdominal surgery as above and history of previous episodes of small bowel obstruction presents with typical symptoms for early bowel obstruction.  Have personally reviewed her imaging which does show moderately dilated  proximal loops of small bowel and decompressed terminal ileum without a definite transition point. Fortunately she is already feeling better since arrival in the emergency department.  Her abdomen is nontender.  I would think we might try to avoid an NG tube and just leave her n.p.o. with IV fluids and bowel rest for now.  If she has recurrent significant pain or vomiting would place NG tube and start small bowel protocol.  Repeat abdominal x-rays tomorrow morning.  Likely to resolve nonoperatively.  Gust with the patient and her husband and all questions answered.  Darene Lamer Tehani Mersman 12/04/2018, 3:34 PM

## 2018-12-04 NOTE — H&P (Addendum)
History and Physical    Tonya Miranda XTG:[id number] DOB: 06/25/1941 DOA: 27/02/5008  PCP: Chriss Czar, MD Patient coming from: Home Chief Complaint: Abdominal pain nausea and vomiting  HPI: Tonya Miranda is a 77 y.o. female with medical history significant of cholecystectomy total abdominal hysterectomy and bilateral salpingo-oophorectomy secondary to ovarian cancer with history of multiple small bowel obstructions secondary to adhesions admitted with complaints of nausea vomiting abdominal pain not able to pass gas for 1 to 2 days.  She gives a history of recurrent small bowel obstruction in the every year.  She gets at least one episode of obstruction where she needs to get admitted to the hospital but this year has been the worst year for her.  She attributes this to excess stress that she has.  Her last hospital admission for SBO was in May 2019.  Last night after dinner she started developing crampy abdominal pain similar to her previous episodes of SBO and she waited till this morning to come to the ER as her pain got worse and she was not passing gas.  She reported after coming to the ER she was used restroom and had a small BM though.  She did have nausea and vomiting at home none here no fever chills or urinary complaints chest pain shortness of breath cough headaches.    ED Course: Patient received fentanyl Zofran and IV fluids.  A CT scan of the abdomen shows multiple borderline mildly dilated loops of mid small bowel.  Decompression of the distal ileum and colon findings concerning for partial SBO.  No discrete transition point is identified.  Colonic diverticulosis without evidence of diverticulitis noted.  Her lipase is 27.  Sodium 140 potassium 3.5 BUN 27 creatinine 1.21.  Creatinine October 2019 was 0.69.  White count 8.4 hemoglobin 10.5 platelet count 244.  Review of Systems: As per HPI otherwise all other systems reviewed and are negative  Ambulatory Status: Patient  is ambulatory at baseline she lives at home with her husband.  Past Medical History:  Diagnosis Date  . 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 Chronic headaches   . Diabetes mellitus   . Diverticulosis of sigmoid colon 04/02/2008   Dr. Oretha Ellis  . Fibromyalgia   . GERD (gastroesophageal reflux disease)   . Hypertension   . Osteoarthritis   . Ovarian cancer ([place])   . PONV (postoperative nausea and vomiting)     Past Surgical History:  Procedure Laterality Date  . ABDOMINAL HYSTERECTOMY     ovarian cancer  . BRAIN SURGERY     aneurysm  . CATARACT EXTRACTION W/PHACO Left 07/11/2018   Procedure: CATARACT EXTRACTION PHACO AND INTRAOCULAR LENS PLACEMENT ([place])  LEFT;  Surgeon: Eulogio Bear, MD;  Location: [place];  Service: Ophthalmology;  Laterality: Left;  Diabetic - diet controlled  . CATARACT EXTRACTION W/PHACO Right 08/22/2018   Procedure: CATARACT EXTRACTION PHACO AND INTRAOCULAR LENS PLACEMENT ([place]) RIGHT;  Surgeon: Eulogio Bear, MD;  Location: [place];  Service: Ophthalmology;  Laterality: Right;  . CHOLECYSTECTOMY    . COLONOSCOPY  04/02/2008   Dr. Oretha Ellis, [place].  Marland Kitchen COLONOSCOPY W/ BIOPSIES  09/09/1999   Dr. Durwin Reges Imam  . CRANIECTOMY  08/10/2012   Procedure: CRANIECTOMY FOR TIC DOULOUREUX;  Surgeon: Hosie Spangle, MD;  Location: [place] [facility];  Service: Neurosurgery;  Laterality: Right;  Right Retromastoid Craniectomy with Microvascular Decompression, Decompression of the trigeminal nerve  . ESOPHAGOGASTRODUODENOSCOPY  01/07/2012  Dr. Oretha Ellis, [place].  . HERNIA REPAIR     left inguinal repair and adhesion repair    Social History   Socioeconomic History  . Marital status: Married    Spouse name: Not on file  . Number of children: 3  . Years of education: Not on file  . Highest education level: Not on file  Occupational History  . Occupation: Environmental health practitioner: [employer]  Social Needs  . Financial resource strain: Not  on file  . Food insecurity:    Worry: Not on file    Inability: Not on file  . Transportation needs:    Medical: Not on file    Non-medical: Not on file  Tobacco Use  . Smoking status: Never Smoker  . Smokeless tobacco: Never Used  Substance and Sexual Activity  . Alcohol use: Yes    Comment: wine rarely  . Drug use: No  . Sexual activity: Not on file  Lifestyle  . Physical activity:    Days per week: Not on file    Minutes per session: Not on file  . Stress: Not on file  Relationships  . Social connections:    Talks on phone: Not on file    Gets together: Not on file    Attends religious service: Not on file    Active member of club or organization: Not on file    Attends meetings of clubs or organizations: Not on file    Relationship status: Not on file  . Intimate partner violence:    Fear of current or ex partner: Not on file    Emotionally abused: Not on file    Physically abused: Not on file    Forced sexual activity: Not on file  Other Topics Concern  . Not on file  Social History Narrative  . Not on file    Allergies  Allergen Reactions  . Cabbage Other (See Comments)    congestion  . Latex Dermatitis    (tape)  . Morphine And Related     Hallucinations, headache    Family History  Problem Relation Age of Onset  . Mesothelioma Sister   . Bone cancer Sister   . Cancer Brother        unknown  . Colon cancer Neg Hx   . Esophageal cancer Neg Hx      Prior to Admission medications   Medication Sig Start Date End Date Taking? Authorizing Provider  acetaminophen (TYLENOL) 500 MG tablet Take 500 mg by mouth every 6 (six) hours as needed for moderate pain.    Yes [provider]  amLODipine (NORVASC) 5 MG tablet Take 5 mg by mouth daily.  08/26/17  Yes [provider]  Bilberry, Vaccinium myrtillus, (BILBERRY PO) Take 1 tablet by mouth daily.   Yes [provider]  cetirizine (ZYRTEC) 10 MG tablet Take 10 mg by mouth daily.    Yes [provider]  Cholecalciferol (VITAMIN D3) 2000 units TABS Take 1 tablet by mouth daily.    Yes [provider]  diphenhydrAMINE (BENADRYL) 25 MG tablet Take 25 mg by mouth every 6 (six) hours as needed for allergies.    Yes [provider]  fluticasone (FLONASE) 50 MCG/ACT nasal spray Place 2 sprays into the nose daily.   Yes [provider]  ibuprofen (ADVIL,MOTRIN) 200 MG tablet Take 200 mg by mouth every 6 (six) hours as needed for mild pain.    Yes [provider]  Magnesium Hydroxide (MILK OF MAGNESIA PO) Take 30 mLs by mouth daily as needed (indigestion).    Yes [provider]  meloxicam (MOBIC) 15 MG tablet Take 15 mg by mouth daily as needed for pain.   Yes [provider]  Multiple Vitamin (MULTIVITAMIN WITH MINERALS) TABS Take 1 tablet by mouth daily.   Yes [provider]  pantoprazole (PROTONIX) 40 MG tablet Take 40 mg by mouth daily as needed. Heart Burn   Yes [provider]    Physical Exam: Vitals:   12/04/18 1154 12/04/18 1200 12/04/18 1245 12/04/18 1300  BP: 112/75 127/72 135/72 135/63  Pulse: 88 78  77  Resp: 19     Temp:      TempSrc:      SpO2: 99% 97%  97%     . General:  Appears calm and comfortable . Eyes:  PERRL, EOMI, normal lids, iris . ENT: grossly normal hearing, lips & tongue, mmm . Neck:  no LAD, masses or thyromegaly . Cardiovascular:  RRR, no m/r/g. No LE edema.  Marland Kitchen Respiratory:  CTA bilaterally, no w/r/r. Normal respiratory effort. . Abdomen: Distended soft hyperactive bowel sounds nontender(patient just received fentanyl.) . Skin: no rash or induration seen on limited exam . Musculoskeletal:  grossly normal tone BUE/BLE, good ROM, no bony abnormality . Psychiatric: grossly normal mood and affect, speech fluent and appropriate, AOx3 . Neurologic: CN 2-12 grossly intact, moves all extremities in coordinated fashion, sensation intact  Labs on Admission: I have  personally reviewed following labs and imaging studies  CBC: Recent Labs  Lab 12/04/18 1132  WBC 8.4  HGB 10.5*  HCT 34.3*  MCV 86.4  PLT 220   Basic Metabolic Panel: Recent Labs  Lab 12/04/18 1132  NA 140  K 3.5  CL 101  CO2 27  GLUCOSE 171*  BUN 27*  CREATININE 1.21*  CALCIUM 9.5   GFR: CrCl cannot be calculated (Unknown ideal weight.). Liver Function Tests: Recent Labs  Lab 12/04/18 1132  AST 21  ALT 15  ALKPHOS 89  BILITOT 0.8  PROT 7.7  ALBUMIN 4.5   Recent Labs  Lab 12/04/18 1132  LIPASE 27   No results for input(s): AMMONIA in the last 168 hours. Coagulation Profile: No results for input(s): INR, PROTIME in the last 168 hours. Cardiac Enzymes: No results for input(s): CKTOTAL, CKMB, CKMBINDEX, TROPONINI in the last 168 hours. BNP (last 3 results) No results for input(s): PROBNP in the last 8760 hours. HbA1C: No results for input(s): HGBA1C in the last 72 hours. CBG: No results for input(s): GLUCAP in the last 168 hours. Lipid Profile: No results for input(s): CHOL, HDL, LDLCALC, TRIG, CHOLHDL, LDLDIRECT in the last 72 hours. Thyroid Function Tests: No results for input(s): TSH, T4TOTAL, FREET4, T3FREE, THYROIDAB in the last 72 hours. Anemia Panel: No results for input(s): VITAMINB12, FOLATE, FERRITIN, TIBC, IRON, RETICCTPCT in the last 72 hours. Urine analysis:    Component Value Date/Time   COLORURINE YELLOW 12/04/2018 1132   APPEARANCEUR CLOUDY (A) 12/04/2018 1132   LABSPEC 1.016 12/04/2018 1132   PHURINE 8.0 12/04/2018 1132   GLUCOSEU NEGATIVE 12/04/2018 1132   HGBUR NEGATIVE 12/04/2018 1132   BILIRUBINUR NEGATIVE 12/04/2018 1132   KETONESUR 5 (A) 12/04/2018 1132   PROTEINUR NEGATIVE 12/04/2018 1132   NITRITE NEGATIVE 12/04/2018 1132   LEUKOCYTESUR NEGATIVE 12/04/2018 1132    Creatinine Clearance: CrCl cannot be calculated (Unknown ideal weight.).  Sepsis Labs: @LABRCNTIP (procalcitonin:4,lacticidven:4) )No results found for  this or any previous  visit (from the past 240 hour(s)).   Radiological Exams on Admission: Ct Abdomen Pelvis W Contrast  Result Date: 12/04/2018 CLINICAL DATA:  77 year old female with history of lower abdominal pain. History of cancer. EXAM: CT ABDOMEN AND PELVIS WITH CONTRAST TECHNIQUE: Multidetector CT imaging of the abdomen and pelvis was performed using the standard protocol following bolus administration of intravenous contrast. CONTRAST:  31mL ISOVUE-300 IOPAMIDOL (ISOVUE-300) INJECTION 61% COMPARISON:  CT the abdomen and pelvis 04/07/2018. FINDINGS: Lower chest: 2 mm subpleural nodule in the periphery of the right lower lobe (axial image 27 of series 5), stable dating back to 07/23/2017, considered definitively benign, presumably a subpleural lymph node. Aortic atherosclerosis. Hepatobiliary: No suspicious cystic or solid hepatic lesions. Status post cholecystectomy. Mild intrahepatic biliary ductal dilatation. Common bile duct measures up to 16 mm in the porta hepatis, similar to the prior examination, presumably related to benign post cholecystectomy physiology. Pancreas: No pancreatic mass. No pancreatic ductal dilatation. No pancreatic or peripancreatic fluid or inflammatory changes. Spleen: Unremarkable. Adrenals/Urinary Tract: Subcentimeter low-attenuation lesion in the interpolar region of the right kidney, too small to characterize, but statistically likely a cyst. Left kidney and bilateral adrenal glands are normal in appearance. No hydroureteronephrosis. Urinary bladder is normal in appearance. Stomach/Bowel: Normal appearance of the stomach. Numerous colonic diverticulae are noted, without surrounding inflammatory changes to suggest an acute diverticulitis at this time. Numerous borderline dilated and mildly dilated loops of mid to distal small bowel measuring up to 4.8 cm in diameter. Several air-fluid levels in these regions. Distal ileum is decompressed. No discrete transition point is  confidently identified. The appendix is not confidently identified and may be surgically absent. Regardless, there are no inflammatory changes noted adjacent to the cecum to suggest the presence of an acute appendicitis at this time. Vascular/Lymphatic: Aortic atherosclerosis, without evidence of aneurysm or dissection in the abdominal or pelvic vasculature. No lymphadenopathy noted in the abdomen or pelvis. Reproductive: Status post hysterectomy. Ovaries are not confidently identified may be surgically absent are trophic. Other: No significant volume of ascites. No pneumoperitoneum. Pelvic floor laxity. Musculoskeletal: There are no aggressive appearing lytic or blastic lesions noted in the visualized portions of the skeleton. IMPRESSION: 1. Multiple borderline and mildly dilated loops of mid small bowel. Decompression of the distal ileum and colon. Findings are concerning for early or partial small bowel obstruction. No discrete transition point is confidently identified at this time. Additionally, stomach and or proximal small bowel do not appear dilated at this time. 2. Colonic diverticulosis without evidence of acute diverticulitis at this time. 3. Aortic atherosclerosis. 4. Additional incidental findings, as above. Electronically Signed   By: Vinnie Langton M.D.   On: 12/04/2018 14:12    EKG:  Assessment/Plan Active Problems:   * No active hospital problems. *    #1 recurrent partial small bowel obstruction secondary to adhesions from previous abdominal surgeries.  Keep the patient n.p.o. start IV fluids and pain control.  Surgery has been consulted and has seen the patient in the ER.  Since patient is not having active vomiting and better abdominal pain they are trying to avoid placing NG tube.  Continue bowel rest.  Follow-up KUB in the morning.  #2 history of hypertension patient takes Norvasc at home I will place her on PRN hydralazine restart Norvasc when she is able to take p.o.  #3 AKI  secondary to decreased p.o. intake dehydration and vomiting which should resolve with hydration.  Recheck labs tomorrow.     Estimated body  mass index is 37.28 kg/m as calculated from the following:   Height as of 09/29/18: 5\' 1"  (1.549 m).   Weight as of 10/01/18: 89.5 kg.   DVT prophylaxis: Lovenox Code Status: Full code Family Communication: Discussed with husband who was in the room Disposition Plan: Pending clinical improvement Consults called: General surgery Admission status: Observation   Georgette Shell MD [facility]  If 7PM-7AM, please contact night-coverage [email or web address] Password [facility]  12/04/2018, 3:49 PM

## 2018-12-04 NOTE — ED Triage Notes (Signed)
Pt c/o abd pains since yesterday. Denies n/v/d or urinary problems.

## 2018-12-04 NOTE — Plan of Care (Signed)
Patient received from ED via stretcher. Complains of abdominal pain and requests pain medication. Will continue to monitor.

## 2018-12-05 ENCOUNTER — Inpatient Hospital Stay (HOSPITAL_COMMUNITY): Payer: Medicare Other

## 2018-12-05 DIAGNOSIS — K56609 Unspecified intestinal obstruction, unspecified as to partial versus complete obstruction: Secondary | ICD-10-CM

## 2018-12-05 DIAGNOSIS — Z90710 Acquired absence of both cervix and uterus: Secondary | ICD-10-CM | POA: Diagnosis not present

## 2018-12-05 DIAGNOSIS — Z9221 Personal history of antineoplastic chemotherapy: Secondary | ICD-10-CM | POA: Diagnosis not present

## 2018-12-05 DIAGNOSIS — Z885 Allergy status to narcotic agent status: Secondary | ICD-10-CM | POA: Diagnosis not present

## 2018-12-05 DIAGNOSIS — K219 Gastro-esophageal reflux disease without esophagitis: Secondary | ICD-10-CM | POA: Diagnosis present

## 2018-12-05 DIAGNOSIS — E669 Obesity, unspecified: Secondary | ICD-10-CM | POA: Diagnosis present

## 2018-12-05 DIAGNOSIS — Z8679 Personal history of other diseases of the circulatory system: Secondary | ICD-10-CM | POA: Diagnosis not present

## 2018-12-05 DIAGNOSIS — Z79899 Other long term (current) drug therapy: Secondary | ICD-10-CM | POA: Diagnosis not present

## 2018-12-05 DIAGNOSIS — I10 Essential (primary) hypertension: Secondary | ICD-10-CM | POA: Diagnosis not present

## 2018-12-05 DIAGNOSIS — Z808 Family history of malignant neoplasm of other organs or systems: Secondary | ICD-10-CM | POA: Diagnosis not present

## 2018-12-05 DIAGNOSIS — Z8619 Personal history of other infectious and parasitic diseases: Secondary | ICD-10-CM | POA: Diagnosis not present

## 2018-12-05 DIAGNOSIS — Z91018 Allergy to other foods: Secondary | ICD-10-CM | POA: Diagnosis not present

## 2018-12-05 DIAGNOSIS — E860 Dehydration: Secondary | ICD-10-CM | POA: Diagnosis present

## 2018-12-05 DIAGNOSIS — N179 Acute kidney failure, unspecified: Secondary | ICD-10-CM | POA: Diagnosis not present

## 2018-12-05 DIAGNOSIS — D649 Anemia, unspecified: Secondary | ICD-10-CM | POA: Diagnosis present

## 2018-12-05 DIAGNOSIS — Z91040 Latex allergy status: Secondary | ICD-10-CM | POA: Diagnosis not present

## 2018-12-05 DIAGNOSIS — Z8701 Personal history of pneumonia (recurrent): Secondary | ICD-10-CM | POA: Diagnosis not present

## 2018-12-05 DIAGNOSIS — M797 Fibromyalgia: Secondary | ICD-10-CM | POA: Diagnosis present

## 2018-12-05 DIAGNOSIS — Z8543 Personal history of malignant neoplasm of ovary: Secondary | ICD-10-CM | POA: Diagnosis not present

## 2018-12-05 DIAGNOSIS — E119 Type 2 diabetes mellitus without complications: Secondary | ICD-10-CM | POA: Diagnosis present

## 2018-12-05 DIAGNOSIS — K5651 Intestinal adhesions [bands], with partial obstruction: Secondary | ICD-10-CM | POA: Diagnosis present

## 2018-12-05 DIAGNOSIS — Z6837 Body mass index (BMI) 37.0-37.9, adult: Secondary | ICD-10-CM | POA: Diagnosis not present

## 2018-12-05 DIAGNOSIS — K5730 Diverticulosis of large intestine without perforation or abscess without bleeding: Secondary | ICD-10-CM | POA: Diagnosis present

## 2018-12-05 DIAGNOSIS — Z9049 Acquired absence of other specified parts of digestive tract: Secondary | ICD-10-CM | POA: Diagnosis not present

## 2018-12-05 LAB — BASIC METABOLIC PANEL
Anion gap: 7 (ref 5–15)
BUN: 18 mg/dL (ref 8–23)
CO2: 21 mmol/L — ABNORMAL LOW (ref 22–32)
Calcium: 7.9 mg/dL — ABNORMAL LOW (ref 8.9–10.3)
Chloride: 116 mmol/L — ABNORMAL HIGH (ref 98–111)
Creatinine, Ser: 0.98 mg/dL (ref 0.44–1.00)
GFR calc Af Amer: >60 mL/min (ref >60)
GFR calc non Af Amer: 55 mL/min — ABNORMAL LOW (ref >60)
Glucose, Bld: 94 mg/dL (ref 70–99)
Potassium: 4 mmol/L (ref 3.5–5.1)
SODIUM: 144 mmol/L (ref 135–145)

## 2018-12-05 LAB — CBC
HCT: 34.3 % — ABNORMAL LOW (ref 36.0–46.0)
Hemoglobin: 9.8 g/dL — ABNORMAL LOW (ref 12.0–15.0)
MCH: 26 pg (ref 26.0–34.0)
MCHC: 28.6 g/dL — AB (ref 30.0–36.0)
MCV: 91 fL (ref 80.0–100.0)
Platelets: 198 10*3/uL (ref 150–400)
RBC: 3.77 MIL/uL — ABNORMAL LOW (ref 3.87–5.11)
RDW: 15.5 % (ref 11.5–15.5)
WBC: 4.1 10*3/uL (ref 4.0–10.5)
nRBC: 0 % (ref 0.0–0.2)

## 2018-12-05 LAB — MAGNESIUM: MAGNESIUM: 2.6 mg/dL — AB (ref 1.7–2.4)

## 2018-12-05 MED ORDER — ENOXAPARIN SODIUM 40 MG/0.4ML ~~LOC~~ SOLN
40.0000 mg | SUBCUTANEOUS | Status: DC
  Administered 2018-12-05: 40 mg via SUBCUTANEOUS
  Filled 2018-12-05: qty 0.4

## 2018-12-05 MED ORDER — MENTHOL 3 MG MT LOZG: 1.0000 | LOZENGE | OROMUCOSAL | Status: DC | PRN

## 2018-12-05 NOTE — Progress Notes (Signed)
Pharmacy:  [facility] 30 q24>>[facility] 40 q24 for improved renal fxn Eudelia Bunch, Pharm.D [phone] 12/05/2018 11:01 AM

## 2018-12-05 NOTE — Progress Notes (Signed)
Patient ID: Tonya Miranda, female   DOB: [birth year], 77 y.o.   MRN: [id number]  PROGRESS NOTE    DARLING CIESLEWICZ  MWU:[id number] DOB: 1941-10-09 DOA: 72/53/6644 PCP: Chriss Czar, MD   Brief Narrative:  77 year old female with history of cholecystectomy, total abdominal hysterectomy and bilateral salpingo-oophorectomy second ovarian cancer with history of multiple small bowel obstructions secondary to adhesions presented with abdominal pain, nausea and vomiting.  She was found to have partial small bowel obstruction on CT of the abdomen.  General surgery was consulted.   Assessment & Plan:   Active Problems:   SBO (small bowel obstruction) ([facility])   AKI (acute kidney injury) ([place])   HTN (hypertension)  Partial small bowel obstruction in a patient with history of multiple small bowel obstructions -Most likely secondary to adhesions from previous abdominal surgeries -No bowel movement yet.  Not passing gas yet.  Currently not nauseous.  No current abdominal pain.   -General surgery following.  Diet advancement as per general surgery.  Continue IV fluids.  Pain management  Hypertension  -Monitor blood pressure.  Norvasc on hold  Acute kidney injury -Secondary to dehydration and poor oral intake -Improving.  Monitor creatinine  Obesity -Outpatient follow-up  DVT prophylaxis: Lovenox Code Status: Full Family Communication: Husband at bedside Disposition Plan: Home once patient has bowel movements and cleared by general surgery  Consultants: General surgery  Procedures: None  Antimicrobials: None   Subjective: Patient seen and examined at bedside.  She complains of some sore throat and back pain.  No current nausea or abdominal pain has not had bowel movement or pass gas.  Objective: Vitals:   12/04/18 1711 12/04/18 1940 12/04/18 2152 12/05/18 0523  BP: (!) 88/78  125/63 (!) 117/56  Pulse: 78  86 83  Resp: 15  14 16   Temp: 98.7 F (37.1 C)  97.8 F (36.6 C)  98.4 F (36.9 C)  TempSrc: Oral  Oral Oral  SpO2: 97%  91% 97%  Weight:  92.7 kg    Height:  5\' 1"  (1.549 m)      Intake/Output Summary (Last 24 hours) at 12/05/2018 1254 Last data filed at 12/05/2018 0347 Gross per 24 hour  Intake 2279.95 ml  Output 800 ml  Net 1479.95 ml   Filed Weights   12/04/18 1940  Weight: 92.7 kg    Examination:  General exam: Appears calm and comfortable.  No distress.  No posterior pharyngeal wall erythema or congestion Respiratory system: Bilateral decreased breath sounds at bases, no wheezing or crackles Cardiovascular system: S1 & S2 heard, Rate controlled Gastrointestinal system: Abdomen is nondistended, soft and nontender.  Bowel sounds sluggish extremities: No cyanosis, clubbing, edema    Data Reviewed: I have personally reviewed following labs and imaging studies  CBC: Recent Labs  Lab 12/04/18 1132 12/05/18 0354  WBC 8.4 4.1  HGB 10.5* 9.8*  HCT 34.3* 34.3*  MCV 86.4 91.0  PLT 244 425   Basic Metabolic Panel: Recent Labs  Lab 12/04/18 1132 12/05/18 0354  NA 140 144  K 3.5 4.0  CL 101 116*  CO2 27 21*  GLUCOSE 171* 94  BUN 27* 18  CREATININE 1.21* 0.98  CALCIUM 9.5 7.9*  MG  --  2.6*   GFR: Estimated Creatinine Clearance: 49.1 mL/min (by C-G formula based on SCr of 0.98 mg/dL). Liver Function Tests: Recent Labs  Lab 12/04/18 1132  AST 21  ALT 15  ALKPHOS 89  BILITOT 0.8  PROT 7.7  ALBUMIN 4.5  Recent Labs  Lab 12/04/18 1132  LIPASE 27   No results for input(s): AMMONIA in the last 168 hours. Coagulation Profile: No results for input(s): INR, PROTIME in the last 168 hours. Cardiac Enzymes: No results for input(s): CKTOTAL, CKMB, CKMBINDEX, TROPONINI in the last 168 hours. BNP (last 3 results) No results for input(s): PROBNP in the last 8760 hours. HbA1C: No results for input(s): HGBA1C in the last 72 hours. CBG: No results for input(s): GLUCAP in the last 168 hours. Lipid Profile: No results for  input(s): CHOL, HDL, LDLCALC, TRIG, CHOLHDL, LDLDIRECT in the last 72 hours. Thyroid Function Tests: No results for input(s): TSH, T4TOTAL, FREET4, T3FREE, THYROIDAB in the last 72 hours. Anemia Panel: No results for input(s): VITAMINB12, FOLATE, FERRITIN, TIBC, IRON, RETICCTPCT in the last 72 hours. Sepsis Labs: No results for input(s): PROCALCITON, LATICACIDVEN in the last 168 hours.  No results found for this or any previous visit (from the past 240 hour(s)).       Radiology Studies: Dg Abd 1 View  Result Date: 12/04/2018 CLINICAL DATA:  Acute generalized abdominal pain. EXAM: ABDOMEN - 1 VIEW COMPARISON:  CT scan of same day.  Radiographs of Apr 08, 2018. FINDINGS: Status post cholecystectomy. Residual contrast is noted in the kidneys and urinary bladder. No abnormal calcifications are noted. No significant bowel dilatation is noted. IMPRESSION: No definite evidence of bowel obstruction or ileus. Electronically Signed   By: Marijo Conception, M.D.   On: 12/04/2018 17:16   Ct Abdomen Pelvis W Contrast  Result Date: 12/04/2018 CLINICAL DATA:  77 year old female with history of lower abdominal pain. History of cancer. EXAM: CT ABDOMEN AND PELVIS WITH CONTRAST TECHNIQUE: Multidetector CT imaging of the abdomen and pelvis was performed using the standard protocol following bolus administration of intravenous contrast. CONTRAST:  52mL ISOVUE-300 IOPAMIDOL (ISOVUE-300) INJECTION 61% COMPARISON:  CT the abdomen and pelvis 04/07/2018. FINDINGS: Lower chest: 2 mm subpleural nodule in the periphery of the right lower lobe (axial image 27 of series 5), stable dating back to 07/23/2017, considered definitively benign, presumably a subpleural lymph node. Aortic atherosclerosis. Hepatobiliary: No suspicious cystic or solid hepatic lesions. Status post cholecystectomy. Mild intrahepatic biliary ductal dilatation. Common bile duct measures up to 16 mm in the porta hepatis, similar to the prior examination,  presumably related to benign post cholecystectomy physiology. Pancreas: No pancreatic mass. No pancreatic ductal dilatation. No pancreatic or peripancreatic fluid or inflammatory changes. Spleen: Unremarkable. Adrenals/Urinary Tract: Subcentimeter low-attenuation lesion in the interpolar region of the right kidney, too small to characterize, but statistically likely a cyst. Left kidney and bilateral adrenal glands are normal in appearance. No hydroureteronephrosis. Urinary bladder is normal in appearance. Stomach/Bowel: Normal appearance of the stomach. Numerous colonic diverticulae are noted, without surrounding inflammatory changes to suggest an acute diverticulitis at this time. Numerous borderline dilated and mildly dilated loops of mid to distal small bowel measuring up to 4.8 cm in diameter. Several air-fluid levels in these regions. Distal ileum is decompressed. No discrete transition point is confidently identified. The appendix is not confidently identified and may be surgically absent. Regardless, there are no inflammatory changes noted adjacent to the cecum to suggest the presence of an acute appendicitis at this time. Vascular/Lymphatic: Aortic atherosclerosis, without evidence of aneurysm or dissection in the abdominal or pelvic vasculature. No lymphadenopathy noted in the abdomen or pelvis. Reproductive: Status post hysterectomy. Ovaries are not confidently identified may be surgically absent are trophic. Other: No significant volume of ascites. No pneumoperitoneum. Pelvic floor  laxity. Musculoskeletal: There are no aggressive appearing lytic or blastic lesions noted in the visualized portions of the skeleton. IMPRESSION: 1. Multiple borderline and mildly dilated loops of mid small bowel. Decompression of the distal ileum and colon. Findings are concerning for early or partial small bowel obstruction. No discrete transition point is confidently identified at this time. Additionally, stomach and or  proximal small bowel do not appear dilated at this time. 2. Colonic diverticulosis without evidence of acute diverticulitis at this time. 3. Aortic atherosclerosis. 4. Additional incidental findings, as above. Electronically Signed   By: Vinnie Langton M.D.   On: 12/04/2018 14:12        Scheduled Meds: . enoxaparin (LOVENOX) injection  40 mg Subcutaneous Q24H  . ondansetron (ZOFRAN) IV  4 mg Intravenous Once   Continuous Infusions: . sodium chloride 100 mL/hr at 12/05/18 0600     LOS: 0 days        Aline August, MD [facility] Pager [phone]  If 7PM-7AM, please contact night-coverage [email or web address] Password [email or web address] 12/05/2018, 12:54 PM

## 2018-12-05 NOTE — Progress Notes (Signed)
[facility]/Trauma Progress Note      Assessment/Plan  Hx of extensive abd surgeries Hx of previous SBO's and ex lap  SBO - pt is feeling better and repeat xray yesterday showed no definite evidence of SBO or ileus - no flatus or BM - no nausea or vomiting  FEN: CLD, do not advance until copious flatus VTE: SCD's, lovenox ID: none Foley: none Follow up: TBD  DISPO: hopefully this will resolve without need for surgery. She is improving so we will hold off on NGT for now. CLD. AXR if symptoms return.     LOS: 0 days    Subjective: CC: SBO  She is feeling much better. Improved abdominal pain and bloating. No nausea or vomiting overnight. No flatus or BM's. Husband at bedside.   Objective: Vital signs in last 24 hours: Temp:  [97.6 F (36.4 C)-98.7 F (37.1 C)] 98.4 F (36.9 C) (12/30 0523) Pulse Rate:  [77-88] 83 (12/30 0523) Resp:  [14-19] 16 (12/30 0523) BP: (88-137)/(56-78) 117/56 (12/30 0523) SpO2:  [91 %-100 %] 97 % (12/30 0523) Weight:  [92.7 kg] 92.7 kg (12/29 1940) Last BM Date: 12/04/18  Intake/Output from previous day: 12/29 0701 - 12/30 0700 In: 2280 [I.V.:1280; IV Piggyback:1000] Out: 300 [Urine:300] Intake/Output this shift: Total I/O In: -  Out: 500 [Urine:500]  PE: Gen:  Alert, NAD, pleasant, cooperative Pulm:  Rate and effort normal Abd: Soft, ND, +BS, no HSM, very mild generalized TTP without guarding. No peritonitis.  Skin: no rashes noted, warm and dry   Anti-infectives: Anti-infectives (From admission, onward)   None      Lab Results:  Recent Labs    12/04/18 1132 12/05/18 0354  WBC 8.4 4.1  HGB 10.5* 9.8*  HCT 34.3* 34.3*  PLT 244 198   BMET Recent Labs    12/04/18 1132 12/05/18 0354  NA 140 144  K 3.5 4.0  CL 101 116*  CO2 27 21*  GLUCOSE 171* 94  BUN 27* 18  CREATININE 1.21* 0.98  CALCIUM 9.5 7.9*   PT/INR No results for input(s): LABPROT, INR in the last 72 hours. CMP     Component Value  Date/Time   NA 144 12/05/2018 0354   K 4.0 12/05/2018 0354   CL 116 (H) 12/05/2018 0354   CO2 21 (L) 12/05/2018 0354   GLUCOSE 94 12/05/2018 0354   BUN 18 12/05/2018 0354   CREATININE 0.98 12/05/2018 0354   CALCIUM 7.9 (L) 12/05/2018 0354   PROT 7.7 12/04/2018 1132   ALBUMIN 4.5 12/04/2018 1132   AST 21 12/04/2018 1132   ALT 15 12/04/2018 1132   ALKPHOS 89 12/04/2018 1132   BILITOT 0.8 12/04/2018 1132   GFRNONAA 55 (L) 12/05/2018 0354   GFRAA >60 12/05/2018 0354   Lipase     Component Value Date/Time   LIPASE 27 12/04/2018 1132    Studies/Results: Dg Abd 1 View  Result Date: 12/04/2018 CLINICAL DATA:  Acute generalized abdominal pain. EXAM: ABDOMEN - 1 VIEW COMPARISON:  CT scan of same day.  Radiographs of Apr 08, 2018. FINDINGS: Status post cholecystectomy. Residual contrast is noted in the kidneys and urinary bladder. No abnormal calcifications are noted. No significant bowel dilatation is noted. IMPRESSION: No definite evidence of bowel obstruction or ileus. Electronically Signed   By: Marijo Conception, M.D.   On: 12/04/2018 17:16   Ct Abdomen Pelvis W Contrast  Result Date: 12/04/2018 CLINICAL DATA:  77 year old female with history of lower abdominal pain. History of cancer.  EXAM: CT ABDOMEN AND PELVIS WITH CONTRAST TECHNIQUE: Multidetector CT imaging of the abdomen and pelvis was performed using the standard protocol following bolus administration of intravenous contrast. CONTRAST:  25mL ISOVUE-300 IOPAMIDOL (ISOVUE-300) INJECTION 61% COMPARISON:  CT the abdomen and pelvis 04/07/2018. FINDINGS: Lower chest: 2 mm subpleural nodule in the periphery of the right lower lobe (axial image 27 of series 5), stable dating back to 07/23/2017, considered definitively benign, presumably a subpleural lymph node. Aortic atherosclerosis. Hepatobiliary: No suspicious cystic or solid hepatic lesions. Status post cholecystectomy. Mild intrahepatic biliary ductal dilatation. Common bile duct  measures up to 16 mm in the porta hepatis, similar to the prior examination, presumably related to benign post cholecystectomy physiology. Pancreas: No pancreatic mass. No pancreatic ductal dilatation. No pancreatic or peripancreatic fluid or inflammatory changes. Spleen: Unremarkable. Adrenals/Urinary Tract: Subcentimeter low-attenuation lesion in the interpolar region of the right kidney, too small to characterize, but statistically likely a cyst. Left kidney and bilateral adrenal glands are normal in appearance. No hydroureteronephrosis. Urinary bladder is normal in appearance. Stomach/Bowel: Normal appearance of the stomach. Numerous colonic diverticulae are noted, without surrounding inflammatory changes to suggest an acute diverticulitis at this time. Numerous borderline dilated and mildly dilated loops of mid to distal small bowel measuring up to 4.8 cm in diameter. Several air-fluid levels in these regions. Distal ileum is decompressed. No discrete transition point is confidently identified. The appendix is not confidently identified and may be surgically absent. Regardless, there are no inflammatory changes noted adjacent to the cecum to suggest the presence of an acute appendicitis at this time. Vascular/Lymphatic: Aortic atherosclerosis, without evidence of aneurysm or dissection in the abdominal or pelvic vasculature. No lymphadenopathy noted in the abdomen or pelvis. Reproductive: Status post hysterectomy. Ovaries are not confidently identified may be surgically absent are trophic. Other: No significant volume of ascites. No pneumoperitoneum. Pelvic floor laxity. Musculoskeletal: There are no aggressive appearing lytic or blastic lesions noted in the visualized portions of the skeleton. IMPRESSION: 1. Multiple borderline and mildly dilated loops of mid small bowel. Decompression of the distal ileum and colon. Findings are concerning for early or partial small bowel obstruction. No discrete transition  point is confidently identified at this time. Additionally, stomach and or proximal small bowel do not appear dilated at this time. 2. Colonic diverticulosis without evidence of acute diverticulitis at this time. 3. Aortic atherosclerosis. 4. Additional incidental findings, as above. Electronically Signed   By: Vinnie Langton M.D.   On: 12/04/2018 14:12      Kalman Drape , [facility] 12/05/2018, 9:54 AM  Pager: [phone] Mon-Wed, Friday 7:00am-4:30pm Thurs 7am-11:30am  Consults: [phone]

## 2018-12-06 LAB — CBC WITH DIFFERENTIAL/PLATELET
Abs Immature Granulocytes: 0.02 10*3/uL (ref 0.00–0.07)
BASOS PCT: 1 %
Basophils Absolute: 0 10*3/uL (ref 0.0–0.1)
Eosinophils Absolute: 0.2 10*3/uL (ref 0.0–0.5)
Eosinophils Relative: 6 %
HCT: 32.4 % — ABNORMAL LOW (ref 36.0–46.0)
Hemoglobin: 9.4 g/dL — ABNORMAL LOW (ref 12.0–15.0)
Immature Granulocytes: 1 %
Lymphocytes Relative: 33 %
Lymphs Abs: 1.1 10*3/uL (ref 0.7–4.0)
MCH: 26.3 pg (ref 26.0–34.0)
MCHC: 29 g/dL — ABNORMAL LOW (ref 30.0–36.0)
MCV: 90.5 fL (ref 80.0–100.0)
Monocytes Absolute: 0.3 10*3/uL (ref 0.1–1.0)
Monocytes Relative: 10 %
Neutro Abs: 1.7 10*3/uL (ref 1.7–7.7)
Neutrophils Relative %: 49 %
PLATELETS: 198 10*3/uL (ref 150–400)
RBC: 3.58 MIL/uL — ABNORMAL LOW (ref 3.87–5.11)
RDW: 15.2 % (ref 11.5–15.5)
WBC: 3.4 10*3/uL — ABNORMAL LOW (ref 4.0–10.5)
nRBC: 0 % (ref 0.0–0.2)

## 2018-12-06 LAB — BASIC METABOLIC PANEL
Anion gap: 7 (ref 5–15)
BUN: 10 mg/dL (ref 8–23)
CO2: 21 mmol/L — ABNORMAL LOW (ref 22–32)
Calcium: 8 mg/dL — ABNORMAL LOW (ref 8.9–10.3)
Chloride: 114 mmol/L — ABNORMAL HIGH (ref 98–111)
Creatinine, Ser: 0.74 mg/dL (ref 0.44–1.00)
GFR calc Af Amer: >60 mL/min (ref >60)
GFR calc non Af Amer: >60 mL/min (ref >60)
Glucose, Bld: 89 mg/dL (ref 70–99)
Potassium: 3.6 mmol/L (ref 3.5–5.1)
Sodium: 142 mmol/L (ref 135–145)

## 2018-12-06 LAB — MAGNESIUM: Magnesium: 2 mg/dL (ref 1.7–2.4)

## 2018-12-06 MED ORDER — ONDANSETRON 4 MG PO TBDP: 4.0000 mg | ORAL_TABLET | Freq: Three times a day (TID) | ORAL | 0 refills | Status: DC | PRN

## 2018-12-06 MED ORDER — TRAMADOL HCL 50 MG PO TABS: 50.0000 mg | ORAL_TABLET | Freq: Four times a day (QID) | ORAL | 0 refills | Status: AC | PRN

## 2018-12-06 NOTE — Progress Notes (Signed)
Patient tolerated her soft tray. Discharge and medication instructions reviewed with patient. Questions answered. Patient denies further questions. No prescriptions given to patient. Spouse is here to drive patient home. Donne Hazel, RN

## 2018-12-06 NOTE — Discharge Summary (Signed)
Physician Discharge Summary  Tonya Miranda XNA:[id number] DOB: [birth year] DOA: 54/27/0623  PCP: Chriss Czar, MD  Admit date: 12/04/2018 Discharge date: 12/06/2018  Admitted From: Home  disposition: Home  Recommendations for Outpatient Follow-up:  1. Follow up with PCP in 1 week 2. 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  She was found to have partial small bowel obstruction on CT of the abdomen.  General surgery was consulted.  She was treated conservatively with IV fluids and n.p.o. status.  Her symptoms improved.  She started passing gas.  She was started on liquid diet and general surgery has advanced diet to soft diet today.  She wants to go home.  General surgery has cleared the patient for discharge.  Discharge Diagnoses:  Active Problems:   SBO (small bowel obstruction) ([facility])   AKI (acute kidney injury) ([facility])   HTN (hypertension)  Partial small bowel obstruction in a patient with history of multiple small bowel obstructions -Most likely secondary to adhesions from previous abdominal surgeries -She was treated conservatively with IV fluids and n.p.o. status.  Her symptoms improved.  She started passing gas.  She was started on liquid diet and general surgery has advanced diet to soft diet today.   She wants to go home.  General surgery has cleared the patient for discharge. -Outpatient follow-up with general surgery  Hypertension  -Resume Norvasc on discharge.  Acute kidney injury -Secondary to dehydration and poor oral intake -Improved.   Outpatient follow-up  Obesity -Outpatient follow-up  Discharge Instructions  Discharge Instructions    Call MD for:  persistant nausea and vomiting   Complete by:  As directed    Call MD for:  severe uncontrolled pain   Complete by:  As directed    Diet - low sodium heart healthy   Complete by:  As directed    Discharge instructions   Complete by:  As directed    Full liquid diet order soft diet for the next few days till bowel function returns back to normal   Increase activity slowly   Complete by:  As directed      Allergies as of 12/06/2018      Reactions   Cabbage Other (See Comments)   congestion   Latex Dermatitis   (tape)   Morphine And Related    Hallucinations, headache      Medication List    TAKE these medications   acetaminophen 500 MG tablet Commonly known as:  TYLENOL Take 500 mg by mouth every 6 (six) hours as needed for moderate pain.   amLODipine 5 MG tablet Commonly known as:  NORVASC Take 5 mg by mouth daily.   BILBERRY PO Take 1 tablet by mouth daily.   cetirizine 10 MG tablet Commonly known as:  ZYRTEC Take 10 mg by mouth daily.   diphenhydrAMINE 25 MG tablet Commonly known as:  BENADRYL Take 25 mg by mouth every 6 (six) hours as needed for allergies.   fluticasone 50 MCG/ACT nasal spray Commonly known as:  FLONASE Place 2 sprays into the nose daily.   ibuprofen 200 MG tablet Commonly known as:  ADVIL,MOTRIN Take 200 mg by mouth every 6 (six) hours as  needed for mild pain.   meloxicam 15 MG tablet Commonly known as:  MOBIC Take 15 mg by mouth daily as needed for pain.   MILK OF MAGNESIA PO Take 30 mLs by mouth daily as needed (indigestion).   multivitamin with minerals Tabs tablet Take 1 tablet by mouth daily.   ondansetron 4 MG disintegrating tablet Commonly known as:  ZOFRAN ODT Take 1 tablet (4 mg total) by mouth every 8 (eight) hours as needed for nausea or vomiting.   pantoprazole 40 MG tablet Commonly known as:   PROTONIX Take 40 mg by mouth daily as needed. Heart Burn   traMADol 50 MG tablet Commonly known as:  ULTRAM Take 1 tablet (50 mg total) by mouth every 6 (six) hours as needed for moderate pain.   Vitamin D3 50 MCG (2000 UT) Tabs Take 1 tablet by mouth daily.      Follow-up Information    Chriss Czar, MD. Schedule an appointment as soon as possible for a visit in 1 week(s).   Specialty:  Family Medicine Contact information: [place] [address] [phone]        Stark Klein, MD. Schedule an appointment as soon as possible for a visit in 1 week(s).   Specialty:  General Surgery Contact information: [address] [place]  [address] [phone]          Allergies  Allergen Reactions  . Cabbage Other (See Comments)    congestion  . Latex Dermatitis    (tape)  . Morphine And Related     Hallucinations, headache    Consultations:  General surgery   Procedures/Studies: Dg Chest 2 View  Result Date: 12/05/2018 CLINICAL DATA:  Acute chest and UPPER back pain with cough. EXAM: CHEST - 2 VIEW COMPARISON:  09/29/2018 FINDINGS: The cardiomediastinal silhouette is unremarkable. Mild elevation of the RIGHT hemidiaphragm is again noted. There is no evidence of focal airspace disease, pulmonary edema, suspicious pulmonary nodule/mass, pleural effusion, or pneumothorax. No acute bony abnormalities are identified. IMPRESSION: No active cardiopulmonary disease. Electronically Signed   By: Margarette Canada M.D.   On: 12/05/2018 17:13   Dg Abd 1 View  Result Date: 12/04/2018 CLINICAL DATA:  Acute generalized abdominal pain. EXAM: ABDOMEN - 1 VIEW COMPARISON:  CT scan of same day.  Radiographs of Apr 08, 2018. FINDINGS: Status post cholecystectomy. Residual contrast is noted in the kidneys and urinary bladder. No abnormal calcifications are noted. No significant bowel dilatation is noted. IMPRESSION: No definite evidence of bowel obstruction or  ileus. Electronically Signed   By: Marijo Conception, M.D.   On: 12/04/2018 17:16   Ct Abdomen Pelvis W Contrast  Result Date: 12/04/2018 CLINICAL DATA:  77 year old female with history of lower abdominal pain. History of cancer. EXAM: CT ABDOMEN AND PELVIS WITH CONTRAST TECHNIQUE: Multidetector CT imaging of the abdomen and pelvis was performed using the standard protocol following bolus administration of intravenous contrast. CONTRAST:  87mL ISOVUE-300 IOPAMIDOL (ISOVUE-300) INJECTION 61% COMPARISON:  CT the abdomen and pelvis 04/07/2018. FINDINGS: Lower chest: 2 mm subpleural nodule in the periphery of the right lower lobe (axial image 27 of series 5), stable dating back to 07/23/2017, considered definitively benign, presumably a subpleural lymph node. Aortic atherosclerosis. Hepatobiliary: No suspicious cystic or solid hepatic lesions. Status post cholecystectomy. Mild intrahepatic biliary ductal dilatation. Common bile duct measures up to 16 mm in the porta hepatis, similar to the prior examination, presumably related to benign post cholecystectomy physiology. Pancreas: No  pancreatic mass. No pancreatic ductal dilatation. No pancreatic or peripancreatic fluid or inflammatory changes. Spleen: Unremarkable. Adrenals/Urinary Tract: Subcentimeter low-attenuation lesion in the interpolar region of the right kidney, too small to characterize, but statistically likely a cyst. Left kidney and bilateral adrenal glands are normal in appearance. No hydroureteronephrosis. Urinary bladder is normal in appearance. Stomach/Bowel: Normal appearance of the stomach. Numerous colonic diverticulae are noted, without surrounding inflammatory changes to suggest an acute diverticulitis at this time. Numerous borderline dilated and mildly dilated loops of mid to distal small bowel measuring up to 4.8 cm in diameter. Several air-fluid levels in these regions. Distal ileum is decompressed. No discrete transition point is confidently  identified. The appendix is not confidently identified and may be surgically absent. Regardless, there are no inflammatory changes noted adjacent to the cecum to suggest the presence of an acute appendicitis at this time. Vascular/Lymphatic: Aortic atherosclerosis, without evidence of aneurysm or dissection in the abdominal or pelvic vasculature. No lymphadenopathy noted in the abdomen or pelvis. Reproductive: Status post hysterectomy. Ovaries are not confidently identified may be surgically absent are trophic. Other: No significant volume of ascites. No pneumoperitoneum. Pelvic floor laxity. Musculoskeletal: There are no aggressive appearing lytic or blastic lesions noted in the visualized portions of the skeleton. IMPRESSION: 1. Multiple borderline and mildly dilated loops of mid small bowel. Decompression of the distal ileum and colon. Findings are concerning for early or partial small bowel obstruction. No discrete transition point is confidently identified at this time. Additionally, stomach and or proximal small bowel do not appear dilated at this time. 2. Colonic diverticulosis without evidence of acute diverticulitis at this time. 3. Aortic atherosclerosis. 4. Additional incidental findings, as above. Electronically Signed   By: Vinnie Langton M.D.   On: 12/04/2018 14:12     Subjective: Patient seen and examined at bedside.  She is passing gas and tolerating diet.  She feels that she is ready to be discharged although she has not moved her bowel yet.  No worsening abdominal pain  Discharge Exam: Vitals:   12/05/18 2155 12/06/18 0550  BP: 139/75 133/68  Pulse: 79 79  Resp: 18 18  Temp: 98.4 F (36.9 C) 98.7 F (37.1 C)  SpO2: 94% 98%   Vitals:   12/05/18 0523 12/05/18 1425 12/05/18 2155 12/06/18 0550  BP: (!) 117/56 (!) 127/110 139/75 133/68  Pulse: 83 82 79 79  Resp: 16 16 18 18   Temp: 98.4 F (36.9 C) 99.2 F (37.3 C) 98.4 F (36.9 C) 98.7 F (37.1 C)  TempSrc: Oral Oral Oral  Oral  SpO2: 97% 95% 94% 98%  Weight:      Height:        General: Pt is alert, awake, not in acute distress Cardiovascular: rate controlled, S1/S2 + Respiratory: bilateral decreased breath sounds at bases Abdominal: Soft, distended, nontender, bowel sounds sluggish Extremities: no edema, no cyanosis    The results of significant diagnostics from this hospitalization (including imaging, microbiology, ancillary and laboratory) are listed below for reference.     Microbiology: No results found for this or any previous visit (from the past 240 hour(s)).   Labs: BNP (last 3 results) No results for input(s): BNP in the last 8760 hours. Basic Metabolic Panel: Recent Labs  Lab 12/04/18 1132 12/05/18 0354 12/06/18 0349  NA 140 144 142  K 3.5 4.0 3.6  CL 101 116* 114*  CO2 27 21* 21*  GLUCOSE 171* 94 89  BUN 27* 18 10  CREATININE 1.21* 0.98 0.74  CALCIUM 9.5 7.9* 8.0*  MG  --  2.6* 2.0   Liver Function Tests: Recent Labs  Lab 12/04/18 1132  AST 21  ALT 15  ALKPHOS 89  BILITOT 0.8  PROT 7.7  ALBUMIN 4.5   Recent Labs  Lab 12/04/18 1132  LIPASE 27   No results for input(s): AMMONIA in the last 168 hours. CBC: Recent Labs  Lab 12/04/18 1132 12/05/18 0354 12/06/18 0349  WBC 8.4 4.1 3.4*  NEUTROABS  --   --  1.7  HGB 10.5* 9.8* 9.4*  HCT 34.3* 34.3* 32.4*  MCV 86.4 91.0 90.5  PLT 244 198 198   Cardiac Enzymes: No results for input(s): CKTOTAL, CKMB, CKMBINDEX, TROPONINI in the last 168 hours. BNP: Invalid input(s): POCBNP CBG: No results for input(s): GLUCAP in the last 168 hours. D-Dimer No results for input(s): DDIMER in the last 72 hours. Hgb A1c No results for input(s): HGBA1C in the last 72 hours. Lipid Profile No results for input(s): CHOL, HDL, LDLCALC, TRIG, CHOLHDL, LDLDIRECT in the last 72 hours. Thyroid function studies No results for input(s): TSH, T4TOTAL, T3FREE, THYROIDAB in the last 72 hours.  Invalid input(s): FREET3 Anemia work  up No results for input(s): VITAMINB12, FOLATE, FERRITIN, TIBC, IRON, RETICCTPCT in the last 72 hours. Urinalysis    Component Value Date/Time   COLORURINE YELLOW 12/04/2018 1132   APPEARANCEUR CLOUDY (A) 12/04/2018 1132   LABSPEC 1.016 12/04/2018 1132   PHURINE 8.0 12/04/2018 1132   GLUCOSEU NEGATIVE 12/04/2018 1132   HGBUR NEGATIVE 12/04/2018 1132   BILIRUBINUR NEGATIVE 12/04/2018 1132   KETONESUR 5 (A) 12/04/2018 1132   PROTEINUR NEGATIVE 12/04/2018 1132   NITRITE NEGATIVE 12/04/2018 1132   LEUKOCYTESUR NEGATIVE 12/04/2018 1132   Sepsis Labs Invalid input(s): PROCALCITONIN,  WBC,  LACTICIDVEN Microbiology No results found for this or any previous visit (from the past 240 hour(s)).   Time coordinating discharge: 35 minutes  SIGNED:   Aline August, MD  [facility] 12/06/2018, 2:26 PM Pager: [phone]  If 7PM-7AM, please contact night-coverage [email or web address] Password [email or web address]

## 2018-12-06 NOTE — Progress Notes (Signed)
[facility]/Trauma Progress Note      Assessment/Plan  Hx of extensive abd surgeries Hx of previous SBO's and ex lap  SBO - pt is feeling better and repeat xray yesterday showed no definite evidence of SBO or ileus Tolerated clears well. Had flatus.  Pain improved.    FEN: advance diet to soft over the morning/early afternoon.   VTE: SCD's, lovenox ID: none Foley: none Follow up: TBD  DISPO: home today as long as no recurrent n/v with diet.  Discussed staying on fulls/soft for a few days.  .     LOS: 1 day    Subjective: CC: SBO  Improved.    Objective: Vital signs in last 24 hours: Temp:  [98.4 F (36.9 C)-99.2 F (37.3 C)] 98.7 F (37.1 C) (12/31 0550) Pulse Rate:  [79-82] 79 (12/31 0550) Resp:  [16-18] 18 (12/31 0550) BP: (127-139)/(68-110) 133/68 (12/31 0550) SpO2:  [94 %-98 %] 98 % (12/31 0550) Last BM Date: 12/04/18  Intake/Output from previous day: 12/30 0701 - 12/31 0700 In: 3339.2 [P.O.:960; I.V.:2379.2] Out: 3800 [Urine:3800] Intake/Output this shift: Total I/O In: 240 [P.O.:240] Out: 650 [Urine:650]  PE: Gen:  Alert, NAD, pleasant, cooperative Pulm:  Rate and effort normal Abd: S, nt, nd..  Skin: no rashes noted, warm and dry   Anti-infectives: Anti-infectives (From admission, onward)   None      Lab Results:  Recent Labs    12/05/18 0354 12/06/18 0349  WBC 4.1 3.4*  HGB 9.8* 9.4*  HCT 34.3* 32.4*  PLT 198 198   BMET Recent Labs    12/05/18 0354 12/06/18 0349  NA 144 142  K 4.0 3.6  CL 116* 114*  CO2 21* 21*  GLUCOSE 94 89  BUN 18 10  CREATININE 0.98 0.74  CALCIUM 7.9* 8.0*   PT/INR No results for input(s): LABPROT, INR in the last 72 hours. CMP     Component Value Date/Time   NA 142 12/06/2018 0349   K 3.6 12/06/2018 0349   CL 114 (H) 12/06/2018 0349   CO2 21 (L) 12/06/2018 0349   GLUCOSE 89 12/06/2018 0349   BUN 10 12/06/2018 0349   CREATININE 0.74 12/06/2018 0349   CALCIUM 8.0 (L)  12/06/2018 0349   PROT 7.7 12/04/2018 1132   ALBUMIN 4.5 12/04/2018 1132   AST 21 12/04/2018 1132   ALT 15 12/04/2018 1132   ALKPHOS 89 12/04/2018 1132   BILITOT 0.8 12/04/2018 1132   GFRNONAA >60 12/06/2018 0349   GFRAA >60 12/06/2018 0349   Lipase     Component Value Date/Time   LIPASE 27 12/04/2018 1132    Studies/Results: Dg Chest 2 View  Result Date: 12/05/2018 CLINICAL DATA:  Acute chest and UPPER back pain with cough. EXAM: CHEST - 2 VIEW COMPARISON:  09/29/2018 FINDINGS: The cardiomediastinal silhouette is unremarkable. Mild elevation of the RIGHT hemidiaphragm is again noted. There is no evidence of focal airspace disease, pulmonary edema, suspicious pulmonary nodule/mass, pleural effusion, or pneumothorax. No acute bony abnormalities are identified. IMPRESSION: No active cardiopulmonary disease. Electronically Signed   By: Margarette Canada M.D.   On: 12/05/2018 17:13   Dg Abd 1 View  Result Date: 12/04/2018 CLINICAL DATA:  Acute generalized abdominal pain. EXAM: ABDOMEN - 1 VIEW COMPARISON:  CT scan of same day.  Radiographs of Apr 08, 2018. FINDINGS: Status post cholecystectomy. Residual contrast is noted in the kidneys and urinary bladder. No abnormal calcifications are noted. No significant bowel dilatation is noted. IMPRESSION: No definite evidence  of bowel obstruction or ileus. Electronically Signed   By: Marijo Conception, M.D.   On: 12/04/2018 17:16   Ct Abdomen Pelvis W Contrast  Result Date: 12/04/2018 CLINICAL DATA:  77 year old female with history of lower abdominal pain. History of cancer. EXAM: CT ABDOMEN AND PELVIS WITH CONTRAST TECHNIQUE: Multidetector CT imaging of the abdomen and pelvis was performed using the standard protocol following bolus administration of intravenous contrast. CONTRAST:  42mL ISOVUE-300 IOPAMIDOL (ISOVUE-300) INJECTION 61% COMPARISON:  CT the abdomen and pelvis 04/07/2018. FINDINGS: Lower chest: 2 mm subpleural nodule in the periphery of the  right lower lobe (axial image 27 of series 5), stable dating back to 07/23/2017, considered definitively benign, presumably a subpleural lymph node. Aortic atherosclerosis. Hepatobiliary: No suspicious cystic or solid hepatic lesions. Status post cholecystectomy. Mild intrahepatic biliary ductal dilatation. Common bile duct measures up to 16 mm in the porta hepatis, similar to the prior examination, presumably related to benign post cholecystectomy physiology. Pancreas: No pancreatic mass. No pancreatic ductal dilatation. No pancreatic or peripancreatic fluid or inflammatory changes. Spleen: Unremarkable. Adrenals/Urinary Tract: Subcentimeter low-attenuation lesion in the interpolar region of the right kidney, too small to characterize, but statistically likely a cyst. Left kidney and bilateral adrenal glands are normal in appearance. No hydroureteronephrosis. Urinary bladder is normal in appearance. Stomach/Bowel: Normal appearance of the stomach. Numerous colonic diverticulae are noted, without surrounding inflammatory changes to suggest an acute diverticulitis at this time. Numerous borderline dilated and mildly dilated loops of mid to distal small bowel measuring up to 4.8 cm in diameter. Several air-fluid levels in these regions. Distal ileum is decompressed. No discrete transition point is confidently identified. The appendix is not confidently identified and may be surgically absent. Regardless, there are no inflammatory changes noted adjacent to the cecum to suggest the presence of an acute appendicitis at this time. Vascular/Lymphatic: Aortic atherosclerosis, without evidence of aneurysm or dissection in the abdominal or pelvic vasculature. No lymphadenopathy noted in the abdomen or pelvis. Reproductive: Status post hysterectomy. Ovaries are not confidently identified may be surgically absent are trophic. Other: No significant volume of ascites. No pneumoperitoneum. Pelvic floor laxity. Musculoskeletal:  There are no aggressive appearing lytic or blastic lesions noted in the visualized portions of the skeleton. IMPRESSION: 1. Multiple borderline and mildly dilated loops of mid small bowel. Decompression of the distal ileum and colon. Findings are concerning for early or partial small bowel obstruction. No discrete transition point is confidently identified at this time. Additionally, stomach and or proximal small bowel do not appear dilated at this time. 2. Colonic diverticulosis without evidence of acute diverticulitis at this time. 3. Aortic atherosclerosis. 4. Additional incidental findings, as above. Electronically Signed   By: Vinnie Langton M.D.   On: 12/04/2018 14:12      Stark Klein , [facility] 12/06/2018, 11:16 AM  Pager: [phone] Mon-Wed, Friday 7:00am-4:30pm Thurs 7am-11:30am  Consults: [phone]

## 2019-06-07 DIAGNOSIS — K56609 Unspecified intestinal obstruction, unspecified as to partial versus complete obstruction: Secondary | ICD-10-CM

## 2019-06-07 HISTORY — DX: Unspecified intestinal obstruction, unspecified as to partial versus complete obstruction: K56.609

## 2019-06-10 ENCOUNTER — Encounter (HOSPITAL_COMMUNITY): Payer: Self-pay | Admitting: *Deleted

## 2019-06-10 ENCOUNTER — Inpatient Hospital Stay (HOSPITAL_COMMUNITY)
Admission: EM | Admit: 2019-06-10 | Discharge: 2019-06-13 | DRG: 390 | Disposition: A | Discharge status: Home / Self Care | Payer: Medicare Other | Attending: Internal Medicine | Admitting: Internal Medicine

## 2019-06-10 ENCOUNTER — Emergency Department (HOSPITAL_COMMUNITY): Payer: Medicare Other

## 2019-06-10 ENCOUNTER — Other Ambulatory Visit: Payer: Self-pay

## 2019-06-10 DIAGNOSIS — K565 Intestinal adhesions [bands], unspecified as to partial versus complete obstruction: Secondary | ICD-10-CM | POA: Diagnosis not present

## 2019-06-10 DIAGNOSIS — Z885 Allergy status to narcotic agent status: Secondary | ICD-10-CM

## 2019-06-10 DIAGNOSIS — Z79899 Other long term (current) drug therapy: Secondary | ICD-10-CM

## 2019-06-10 DIAGNOSIS — R042 Hemoptysis: Secondary | ICD-10-CM

## 2019-06-10 DIAGNOSIS — K56609 Unspecified intestinal obstruction, unspecified as to partial versus complete obstruction: Secondary | ICD-10-CM | POA: Diagnosis not present

## 2019-06-10 DIAGNOSIS — Z1159 Encounter for screening for other viral diseases: Secondary | ICD-10-CM

## 2019-06-10 DIAGNOSIS — Z9841 Cataract extraction status, right eye: Secondary | ICD-10-CM

## 2019-06-10 DIAGNOSIS — E119 Type 2 diabetes mellitus without complications: Secondary | ICD-10-CM | POA: Diagnosis present

## 2019-06-10 DIAGNOSIS — Z91040 Latex allergy status: Secondary | ICD-10-CM

## 2019-06-10 DIAGNOSIS — M797 Fibromyalgia: Secondary | ICD-10-CM | POA: Diagnosis present

## 2019-06-10 DIAGNOSIS — Z8543 Personal history of malignant neoplasm of ovary: Secondary | ICD-10-CM

## 2019-06-10 DIAGNOSIS — Z961 Presence of intraocular lens: Secondary | ICD-10-CM | POA: Diagnosis present

## 2019-06-10 DIAGNOSIS — R51 Headache: Secondary | ICD-10-CM | POA: Diagnosis present

## 2019-06-10 DIAGNOSIS — K5730 Diverticulosis of large intestine without perforation or abscess without bleeding: Secondary | ICD-10-CM | POA: Diagnosis present

## 2019-06-10 DIAGNOSIS — R1030 Lower abdominal pain, unspecified: Secondary | ICD-10-CM | POA: Diagnosis not present

## 2019-06-10 DIAGNOSIS — K219 Gastro-esophageal reflux disease without esophagitis: Secondary | ICD-10-CM | POA: Diagnosis present

## 2019-06-10 DIAGNOSIS — M1990 Unspecified osteoarthritis, unspecified site: Secondary | ICD-10-CM | POA: Diagnosis present

## 2019-06-10 DIAGNOSIS — Z90710 Acquired absence of both cervix and uterus: Secondary | ICD-10-CM

## 2019-06-10 DIAGNOSIS — Z91018 Allergy to other foods: Secondary | ICD-10-CM

## 2019-06-10 DIAGNOSIS — I10 Essential (primary) hypertension: Secondary | ICD-10-CM | POA: Diagnosis present

## 2019-06-10 DIAGNOSIS — Z9842 Cataract extraction status, left eye: Secondary | ICD-10-CM

## 2019-06-10 DIAGNOSIS — Z7951 Long term (current) use of inhaled steroids: Secondary | ICD-10-CM

## 2019-06-10 LAB — LIPASE, BLOOD: Lipase: 24 U/L (ref 11–51)

## 2019-06-10 LAB — URINALYSIS, ROUTINE W REFLEX MICROSCOPIC
Bacteria, UA: NONE SEEN
Bilirubin Urine: NEGATIVE
Glucose, UA: NEGATIVE mg/dL
Hgb urine dipstick: NEGATIVE
Ketones, ur: NEGATIVE mg/dL
Leukocytes,Ua: NEGATIVE
Nitrite: NEGATIVE
Protein, ur: 30 mg/dL — AB
Specific Gravity, Urine: 1.019 (ref 1.005–1.030)
pH: 8 (ref 5.0–8.0)

## 2019-06-10 LAB — CBC WITH DIFFERENTIAL/PLATELET
Abs Immature Granulocytes: 0.02 10*3/uL (ref 0.00–0.07)
Basophils Absolute: 0 10*3/uL (ref 0.0–0.1)
Basophils Relative: 0 %
Eosinophils Absolute: 0 10*3/uL (ref 0.0–0.5)
Eosinophils Relative: 0 %
HCT: 36.3 % (ref 36.0–46.0)
Hemoglobin: 11 g/dL — ABNORMAL LOW (ref 12.0–15.0)
Immature Granulocytes: 0 %
Lymphocytes Relative: 7 %
Lymphs Abs: 0.5 10*3/uL — ABNORMAL LOW (ref 0.7–4.0)
MCH: 25.8 pg — ABNORMAL LOW (ref 26.0–34.0)
MCHC: 30.3 g/dL (ref 30.0–36.0)
MCV: 85.2 fL (ref 80.0–100.0)
Monocytes Absolute: 0.2 10*3/uL (ref 0.1–1.0)
Monocytes Relative: 3 %
Neutro Abs: 6.3 10*3/uL (ref 1.7–7.7)
Neutrophils Relative %: 90 %
Platelets: 245 10*3/uL (ref 150–400)
RBC: 4.26 MIL/uL (ref 3.87–5.11)
RDW: 16.4 % — ABNORMAL HIGH (ref 11.5–15.5)
WBC: 7.1 10*3/uL (ref 4.0–10.5)
nRBC: 0 % (ref 0.0–0.2)

## 2019-06-10 LAB — COMPREHENSIVE METABOLIC PANEL
ALT: 19 U/L (ref 0–44)
AST: 23 U/L (ref 15–41)
Albumin: 3.8 g/dL (ref 3.5–5.0)
Alkaline Phosphatase: 90 U/L (ref 38–126)
Anion gap: 9 (ref 5–15)
BUN: 17 mg/dL (ref 8–23)
CO2: 24 mmol/L (ref 22–32)
Calcium: 9.1 mg/dL (ref 8.9–10.3)
Chloride: 108 mmol/L (ref 98–111)
Creatinine, Ser: 0.88 mg/dL (ref 0.44–1.00)
GFR calc Af Amer: >60 mL/min (ref >60)
GFR calc non Af Amer: >60 mL/min (ref >60)
Glucose, Bld: 155 mg/dL — ABNORMAL HIGH (ref 70–99)
Potassium: 3.7 mmol/L (ref 3.5–5.1)
Sodium: 141 mmol/L (ref 135–145)
Total Bilirubin: 0.7 mg/dL (ref 0.3–1.2)
Total Protein: 6.9 g/dL (ref 6.5–8.1)

## 2019-06-10 LAB — HEMOGLOBIN A1C
Hgb A1c MFr Bld: 5.9 % — ABNORMAL HIGH (ref 4.8–5.6)
Mean Plasma Glucose: 122.63 mg/dL

## 2019-06-10 LAB — GLUCOSE, CAPILLARY
Glucose-Capillary: 112 mg/dL — ABNORMAL HIGH (ref 70–99)
Glucose-Capillary: 131 mg/dL — ABNORMAL HIGH (ref 70–99)
Glucose-Capillary: 93 mg/dL (ref 70–99)

## 2019-06-10 LAB — SARS CORONAVIRUS 2 BY RT PCR (HOSPITAL ORDER, PERFORMED IN ~~LOC~~ HOSPITAL LAB): SARS Coronavirus 2: NEGATIVE

## 2019-06-10 MED ORDER — SODIUM CHLORIDE 0.9 % IV BOLUS
500.0000 mL | Freq: Once | INTRAVENOUS | Status: AC
  Administered 2019-06-10: 11:00:00 500 mL via INTRAVENOUS

## 2019-06-10 MED ORDER — HYDROMORPHONE HCL 1 MG/ML IJ SOLN
0.5000 mg | INTRAMUSCULAR | Status: DC | PRN
  Administered 2019-06-10 (×2): 0.5 mg via INTRAVENOUS
  Filled 2019-06-10 (×2): qty 1

## 2019-06-10 MED ORDER — AMLODIPINE BESYLATE 5 MG PO TABS
5.0000 mg | ORAL_TABLET | Freq: Every day | ORAL | Status: DC
  Administered 2019-06-11 – 2019-06-12 (×2): 5 mg via ORAL
  Filled 2019-06-10 (×2): qty 1

## 2019-06-10 MED ORDER — ONDANSETRON HCL 4 MG/2ML IJ SOLN
4.0000 mg | Freq: Once | INTRAMUSCULAR | Status: AC
  Administered 2019-06-10: 11:00:00 4 mg via INTRAVENOUS
  Filled 2019-06-10: qty 2

## 2019-06-10 MED ORDER — ONDANSETRON HCL 4 MG/2ML IJ SOLN
4.0000 mg | Freq: Four times a day (QID) | INTRAMUSCULAR | Status: DC | PRN
  Administered 2019-06-10 (×2): 4 mg via INTRAVENOUS
  Filled 2019-06-10 (×2): qty 2

## 2019-06-10 MED ORDER — DIATRIZOATE MEGLUMINE & SODIUM 66-10 % PO SOLN
90.0000 mL | Freq: Once | ORAL | Status: DC
  Filled 2019-06-10: qty 90

## 2019-06-10 MED ORDER — FLUTICASONE PROPIONATE 50 MCG/ACT NA SUSP
1.0000 | Freq: Every day | NASAL | Status: DC
  Administered 2019-06-13: 1 via NASAL
  Filled 2019-06-10: qty 16

## 2019-06-10 MED ORDER — ONDANSETRON HCL 4 MG PO TABS: 4.0000 mg | ORAL_TABLET | Freq: Four times a day (QID) | ORAL | Status: DC | PRN

## 2019-06-10 MED ORDER — ACETAMINOPHEN 325 MG PO TABS: 650.0000 mg | ORAL_TABLET | Freq: Four times a day (QID) | ORAL | Status: DC | PRN

## 2019-06-10 MED ORDER — ACETAMINOPHEN 650 MG RE SUPP: 650.0000 mg | Freq: Four times a day (QID) | RECTAL | Status: DC | PRN

## 2019-06-10 MED ORDER — HYDROMORPHONE HCL 1 MG/ML IJ SOLN
0.5000 mg | Freq: Once | INTRAMUSCULAR | Status: AC
  Administered 2019-06-10: 0.5 mg via INTRAVENOUS
  Filled 2019-06-10: qty 1

## 2019-06-10 MED ORDER — LACTATED RINGERS IV SOLN
INTRAVENOUS | Status: DC
  Administered 2019-06-10 – 2019-06-12 (×5): via INTRAVENOUS

## 2019-06-10 MED ORDER — INSULIN ASPART 100 UNIT/ML ~~LOC~~ SOLN: 0.0000 [IU] | Freq: Three times a day (TID) | SUBCUTANEOUS | Status: DC

## 2019-06-10 MED ORDER — IOHEXOL 300 MG/ML  SOLN
100.0000 mL | Freq: Once | INTRAMUSCULAR | Status: AC | PRN
  Administered 2019-06-10: 100 mL via INTRAVENOUS

## 2019-06-10 NOTE — Progress Notes (Signed)
Patient refused the PO gastrografin at this time since she is not yet ready.  Will attempt in the next hour.

## 2019-06-10 NOTE — ED Triage Notes (Signed)
PT from home with  Reported ABD pain with vomiting x 1 this AM.  Pt has previous ABD  Adhesions and blockages.

## 2019-06-10 NOTE — Consult Note (Signed)
Reason for Consult:n/v/abd pain Referring Physician: Dr Cheral Almas is an 78 y.o. female.  HPI: 26 yof with multiple abdominal surgeries. She states she doesn't know how many. Appears she has been treated for ovarian cancer with surgery and then has at least one elap for sbo previously. None in a number of years. She has been admitted for sbo in past and most recent was here in December 2019.  She had pain beginning overnight that progressed to being sharp and constant. Nothing was helping. It was associated with n/v. She has no flatus. Had bm this am. She tells me that she cannot have an ng tube even if placed while asleep because she was buried alive as a [age] year old and cant have anything on her face.  She adamantly refuses. 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 Oretha Ellis   Fibromyalgia    GERD (gastroesophageal reflux disease)    Hypertension    Osteoarthritis    Ovarian cancer ([place])    PONV (postoperative nausea and vomiting)     Past Surgical History:  Procedure Laterality Date   ABDOMINAL HYSTERECTOMY     ovarian cancer   BRAIN SURGERY     aneurysm   CATARACT EXTRACTION W/PHACO Left 07/11/2018   Procedure: CATARACT EXTRACTION PHACO AND INTRAOCULAR LENS PLACEMENT ([place])  LEFT;  Surgeon: Eulogio Bear, MD;  Location: [place];  Service: Ophthalmology;  Laterality: Left;  Diabetic - diet controlled   CATARACT EXTRACTION W/PHACO Right 08/22/2018   Procedure: CATARACT EXTRACTION PHACO AND INTRAOCULAR LENS PLACEMENT ([facility]) RIGHT;  Surgeon: Eulogio Bear, MD;  Location: [facility];  Service: Ophthalmology;  Laterality: Right;   CHOLECYSTECTOMY     COLONOSCOPY  04/02/2008   Dr. Oretha Ellis, Jr.   COLONOSCOPY W/ BIOPSIES  09/09/1999   Dr. Durwin Reges Imam    CRANIECTOMY  08/10/2012   Procedure: CRANIECTOMY FOR TIC DOULOUREUX;  Surgeon: Hosie Spangle, MD;  Location: [place] [facility];  Service: Neurosurgery;  Laterality: Right;  Right Retromastoid Craniectomy with Microvascular Decompression, Decompression of the trigeminal nerve   ESOPHAGOGASTRODUODENOSCOPY  01/07/2012   Dr. Oretha Ellis, [facility].   HERNIA REPAIR     left inguinal repair and adhesion repair    Family History  Problem Relation Age of Onset   Mesothelioma Sister    Bone cancer Sister    Cancer Brother        unknown   Colon cancer Neg Hx    Esophageal cancer Neg Hx     Social History:  reports that she has never smoked. She has never used smokeless tobacco. She reports current alcohol use. She reports that she does not use drugs.  Allergies:  Allergies  Allergen Reactions   Cabbage Other (See Comments)    Congestion- nasal    Latex Dermatitis    (tape)   Mold Extract [Trichophyton] Other (See Comments)    Sinus congestion   Morphine And Related Other (See Comments)    Hallucinations, headaches   Mushroom Extract Complex Other (See Comments)    Sinus congestion   Tape Dermatitis    Medications: I have reviewed the patient's current medications.  Results for orders placed or performed during the hospital encounter of 06/10/19 (from the past 48 hour(s))  Comprehensive metabolic  panel     Status: Abnormal   Collection Time: 06/10/19 10:45 AM  Result Value Ref Range   Sodium 141 135 - 145 mmol/L   Potassium 3.7 3.5 - 5.1 mmol/L   Chloride 108 98 - 111 mmol/L   CO2 24 22 - 32 mmol/L   Glucose, Bld 155 (H) 70 - 99 mg/dL   BUN 17 8 - 23 mg/dL   Creatinine, Ser 0.88 0.44 - 1.00 mg/dL   Calcium 9.1 8.9 - 10.3 mg/dL   Total Protein 6.9 6.5 - 8.1 g/dL   Albumin 3.8 3.5 - 5.0 g/dL   AST 23 15 - 41 U/L   ALT 19 0 - 44 U/L   Alkaline Phosphatase 90 38 - 126 U/L   Total Bilirubin 0.7 0.3 - 1.2 mg/dL   GFR calc non Af Amer >60 >60 mL/min   GFR calc Af Amer >60  >60 mL/min   Anion gap 9 5 - 15    Comment: Performed at [facility], [address], [place], [place] [address]  Lipase, blood     Status: None   Collection Time: 06/10/19 10:45 AM  Result Value Ref Range   Lipase 24 11 - 51 U/L    Comment: Performed at [facility] [address], [place], [place] [address]  CBC with Differential     Status: Abnormal   Collection Time: 06/10/19 10:45 AM  Result Value Ref Range   WBC 7.1 4.0 - 10.5 K/uL   RBC 4.26 3.87 - 5.11 MIL/uL   Hemoglobin 11.0 (L) 12.0 - 15.0 g/dL   HCT 36.3 36.0 - 46.0 %   MCV 85.2 80.0 - 100.0 fL   MCH 25.8 (L) 26.0 - 34.0 pg   MCHC 30.3 30.0 - 36.0 g/dL   RDW 16.4 (H) 11.5 - 15.5 %   Platelets 245 150 - 400 K/uL   nRBC 0.0 0.0 - 0.2 %   Neutrophils Relative % 90 %   Neutro Abs 6.3 1.7 - 7.7 K/uL   Lymphocytes Relative 7 %   Lymphs Abs 0.5 (L) 0.7 - 4.0 K/uL   Monocytes Relative 3 %   Monocytes Absolute 0.2 0.1 - 1.0 K/uL   Eosinophils Relative 0 %   Eosinophils Absolute 0.0 0.0 - 0.5 K/uL   Basophils Relative 0 %   Basophils Absolute 0.0 0.0 - 0.1 K/uL   Immature Granulocytes 0 %   Abs Immature Granulocytes 0.02 0.00 - 0.07 K/uL    Comment: Performed at [facility], [place] [address] [place], [place] [address]  Urinalysis, Routine w reflex microscopic     Status: Abnormal   Collection Time: 06/10/19 12:45 PM  Result Value Ref Range   Color, Urine YELLOW YELLOW   APPearance CLOUDY (A) CLEAR   Specific Gravity, Urine 1.019 1.005 - 1.030   pH 8.0 5.0 - 8.0   Glucose, UA NEGATIVE NEGATIVE mg/dL   Hgb urine dipstick NEGATIVE NEGATIVE   Bilirubin Urine NEGATIVE NEGATIVE   Ketones, ur NEGATIVE NEGATIVE mg/dL   Protein, ur 30 (A) NEGATIVE mg/dL   Nitrite NEGATIVE NEGATIVE   Leukocytes,Ua NEGATIVE NEGATIVE   RBC / HPF 0-5 0 - 5 RBC/hpf   Bacteria, UA NONE SEEN NONE SEEN   Squamous Epithelial / LPF 0-5 0 - 5   Uric Acid Crys, UA PRESENT     Comment: Performed at [facility]  [address], [place], [place] [address]    Ct Abdomen Pelvis  W Contrast  Result Date: 06/10/2019 CLINICAL DATA:  Abdominal pain and vomiting. Recurrent small bowel obstruction. EXAM: CT ABDOMEN AND PELVIS WITH CONTRAST TECHNIQUE: Multidetector CT imaging of the abdomen and pelvis was performed using the standard protocol following bolus administration of intravenous contrast. CONTRAST:  168mL OMNIPAQUE IOHEXOL 300 MG/ML  SOLN COMPARISON:  12/04/2018 CT abdomen/pelvis. FINDINGS: Lower chest: Mild dependent platelike atelectasis at the lung bases. Hepatobiliary: Normal liver size. No liver mass. Cholecystectomy. Stable mild diffuse intrahepatic biliary ductal dilatation. Stable CBD diameter 9 mm, within normal post cholecystectomy limits. No radiopaque choledocholithiasis. Pancreas: Normal, with no mass or duct dilation. Spleen: Normal size. No mass. Adrenals/Urinary Tract: Normal adrenals. Subcentimeter hypodense renal cortical lesions in the interpolar kidneys bilaterally, too small to characterize, which require no follow-up. No hydronephrosis. Normal bladder. Stomach/Bowel: Normal non-distended stomach. There are multiple moderately dilated fluid-filled proximal to mid small bowel loops measuring up to 4.3 cm diameter. Distal small bowel is collapsed. There is a caliber transition in the mid small bowel adjacent to the lower ventral laparotomy scar (series 3/image 70). No small bowel wall thickening or pneumatosis. Appendix not discretely visualized. No pericecal inflammatory changes. Moderate sigmoid diverticulosis, with no large bowel wall thickening or significant pericolonic fat stranding. Vascular/Lymphatic: Atherosclerotic nonaneurysmal abdominal aorta. Patent portal, splenic, hepatic and renal veins. No pathologically enlarged lymph nodes in the abdomen or pelvis. Reproductive: Status post hysterectomy, with no abnormal findings at the vaginal cuff. No adnexal mass. Other: Trace pelvic and right pericolic gutter  ascites. No focal fluid collection. Mild central mesenteric edema. No free air. Musculoskeletal: No aggressive appearing focal osseous lesions. Marked lumbar spondylosis. IMPRESSION: 1. CT findings are compatible with mechanical mid small bowel obstruction at the level of the ventral lower laparotomy scar, presumably due to adhesions. 2. Trace pelvic and right paracolic gutter ascites. Mild central mesenteric edema. No focal fluid collection. No free air. No bowel wall thickening. 3. Moderate sigmoid diverticulosis. 4.  Aortic Atherosclerosis (ICD10-I70.0). Electronically Signed   By: Ilona Sorrel M.D.   On: 06/10/2019 13:32    Review of Systems  Gastrointestinal: Positive for abdominal pain, constipation, nausea and vomiting.  Psychiatric/Behavioral: The patient is nervous/anxious.   All other systems reviewed and are negative.  Blood pressure 112/76, pulse 100, temperature 98 F (36.7 C), temperature source Oral, resp. rate 20, height 5\' 1"  (1.549 m), weight 90.7 kg, SpO2 99 %. Physical Exam  Vitals reviewed. Constitutional: She is oriented to person, place, and time. She appears well-developed and well-nourished.  HENT:  Head: Normocephalic and atraumatic.  Right Ear: External ear normal.  Left Ear: External ear normal.  Mouth/Throat: Oropharynx is clear and moist.  Eyes: EOM are normal. No scleral icterus.  Neck: Neck supple.  Cardiovascular: Normal rate, regular rhythm, normal heart sounds and intact distal pulses.  Respiratory: Effort normal and breath sounds normal. She has no wheezes.  GI: She exhibits distension. Bowel sounds are decreased. There is generalized abdominal tenderness. There is no rebound. No hernia.    Musculoskeletal: Normal range of motion.        General: No tenderness or edema.  Neurological: She is alert and oriented to person, place, and time.  Skin: Skin is warm and dry.  Psychiatric: Her behavior is normal. Her mood appears anxious.     Assessment/Plan: SBO, likely adhesive -ideally ng tube decompression with gg protocol but she will not do the ng tube -will do gg orally and repeat film and hopefully improves with this -will follow with you -  discussed with her indications for surgery including worsening as well as nonprogression  Rolm Bookbinder 06/10/2019, 2:58 PM

## 2019-06-10 NOTE — ED Triage Notes (Signed)
Pt tearful after Covid Adm Swab and does not want NG tube placed.

## 2019-06-10 NOTE — ED Triage Notes (Signed)
TC to Pt Daughter to report Her mother will be ADM  for bowel obs.. Pt also called  Her husband to report the plan for admission.

## 2019-06-10 NOTE — ED Provider Notes (Signed)
[facility] EMERGENCY DEPARTMENT Provider Note   CSN: [id number] Arrival date & time: 06/10/19  1007    History   Chief Complaint Chief Complaint  Patient presents with   Abdominal Pain    HPI Tonya Miranda is a 78 y.o. female with history of hypertension, diabetes, recurrent SBO, GERD who presents with abdominal pain that began this morning and one episode of vomiting.  Patient reports her pain is in the lower region.  She reports it feels similar to her past bowel obstructions.  She did have a bowel movement that was basically normal for her this morning.  She has not passed any gas since.  She struggles with chronic constipation and takes milk of magnesia.  She has had several abdominal surgeries.     HPI  Past Medical History:  Diagnosis Date   Aneurysm ([place])    2 times   Chronic headaches    Diabetes mellitus    Diverticulosis of sigmoid colon 04/02/2008   Dr. Oretha Ellis   Fibromyalgia    GERD (gastroesophageal reflux disease)    Hypertension    Osteoarthritis    Ovarian cancer ([place])    PONV (postoperative nausea and vomiting)     Patient Active Problem List   Diagnosis Date Noted   SBO (small bowel obstruction) ([facility]) 12/04/2018   AKI (acute kidney injury) ([place]) 12/04/2018   HTN (hypertension) 12/04/2018   Pain    Legionella pneumonia ([place]) 10/01/2018   Diabetes mellitus without complication ([place]) 07/20/4817   Sepsis ([place]) 09/29/2018   Lobar pneumonia ([place]) 09/29/2018   Normocytic anemia 09/29/2018   Acute respiratory failure with hypoxia ([place]) 09/29/2018   Bilateral upper lobe community acquired pneumonia ([place])    Small bowel obstruction ([place]) 04/07/2018   Hypertension 04/07/2018   GERD (gastroesophageal reflux disease) 56/31/4970   Cyclic vomiting syndrome 10/09/2013   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;  Service: Ophthalmology;  Laterality: Left;  Diabetic - diet controlled   CATARACT EXTRACTION W/PHACO Right 08/22/2018   Procedure: CATARACT EXTRACTION PHACO AND INTRAOCULAR LENS PLACEMENT ([facility]) RIGHT;  Surgeon: Eulogio Bear, MD;  Location: [facility];  Service: Ophthalmology;  Laterality: Right;   CHOLECYSTECTOMY     COLONOSCOPY  04/02/2008   Dr. Oretha Ellis, Jr.   COLONOSCOPY W/ BIOPSIES  09/09/1999   Dr. Durwin Reges Imam   CRANIECTOMY  08/10/2012   Procedure: CRANIECTOMY FOR TIC DOULOUREUX;  Surgeon: Hosie Spangle, MD;  Location: [facility];  Service: Neurosurgery;  Laterality: Right;  Right Retromastoid Craniectomy with Microvascular Decompression, Decompression of the trigeminal nerve   ESOPHAGOGASTRODUODENOSCOPY  01/07/2012   Dr. Oretha Ellis, [place].   HERNIA REPAIR     left inguinal repair and adhesion repair     OB History   No obstetric history on file.      Home Medications    Prior to Admission medications   Medication Sig Start Date End Date Taking? Authorizing Provider  acetaminophen (TYLENOL) 500 MG tablet Take 500 mg by mouth every 6 (six) hours as needed for moderate pain.     [provider]  amLODipine ([place])  5 MG tablet Take 5 mg by mouth daily.  08/26/17   [provider]  Bilberry, Vaccinium myrtillus, (BILBERRY PO) Take 1 tablet by mouth daily.    [provider]  cetirizine (ZYRTEC) 10 MG tablet Take 10 mg by mouth daily.    [provider]  Cholecalciferol (VITAMIN D3) 2000 units TABS Take 1 tablet by mouth daily.     [provider]  diphenhydrAMINE (BENADRYL) 25 MG tablet Take 25 mg by mouth every 6 (six) hours as needed for allergies.      [provider]  fluticasone (FLONASE) 50 MCG/ACT nasal spray Place 2 sprays into the nose daily.    [provider]  ibuprofen (ADVIL,MOTRIN) 200 MG tablet Take 200 mg by mouth every 6 (six) hours as needed for mild pain.     [provider]  Magnesium Hydroxide (MILK OF MAGNESIA PO) Take 30 mLs by mouth daily as needed (indigestion).     [provider]  meloxicam (MOBIC) 15 MG tablet Take 15 mg by mouth daily as needed for pain.    [provider]  Multiple Vitamin (MULTIVITAMIN WITH MINERALS) TABS Take 1 tablet by mouth daily.    [provider]  ondansetron (ZOFRAN ODT) 4 MG disintegrating tablet Take 1 tablet (4 mg total) by mouth every 8 (eight) hours as needed for nausea or vomiting. 12/06/18   Aline August, MD  pantoprazole (PROTONIX) 40 MG tablet Take 40 mg by mouth daily as needed. Heart Burn    [provider]  traMADol (ULTRAM) 50 MG tablet Take 1 tablet (50 mg total) by mouth every 6 (six) hours as needed for moderate pain. 12/06/18 12/06/19  Aline August, MD    Family History Family History  Problem Relation Age of Onset   Mesothelioma Sister    Bone cancer Sister    Cancer Brother        unknown   Colon cancer Neg Hx    Esophageal cancer Neg Hx     Social History Social History   Tobacco Use   Smoking status: Never Smoker   Smokeless tobacco: Never Used  Substance Use Topics   Alcohol use: Yes    Comment: wine rarely   Drug use: No     Allergies   Cabbage, Latex, and Morphine and related   Review of Systems Review of Systems  Constitutional: Negative for chills and fever.  HENT: Negative for facial swelling and sore throat.   Respiratory: Negative for shortness of breath.   Cardiovascular: Negative for chest pain.  Gastrointestinal: Positive for abdominal pain, constipation, nausea and vomiting. Negative for diarrhea.  Genitourinary: Negative for dysuria.  Musculoskeletal:  Negative for back pain.  Skin: Negative for rash and wound.  Neurological: Negative for headaches.  Psychiatric/Behavioral: The patient is not nervous/anxious.      Physical Exam Updated Vital Signs BP 112/76    Pulse 100    Temp 98 F (36.7 C) (Oral)    Resp 20    Ht 5\' 1"  (1.549 m)    Wt 90.7 kg    SpO2 99%    BMI 37.79 kg/m   Physical Exam Vitals signs and nursing note reviewed.  Constitutional:      General: She is not in acute distress.    Appearance: She is well-developed. She is not diaphoretic.  HENT:     Head: Normocephalic and atraumatic.     Mouth/Throat:     Pharynx: No oropharyngeal exudate.  Eyes:  General: No scleral icterus.       Right eye: No discharge.        Left eye: No discharge.     Conjunctiva/sclera: Conjunctivae normal.     Pupils: Pupils are equal, round, and reactive to light.  Neck:     Musculoskeletal: Normal range of motion and neck supple.     Thyroid: No thyromegaly.  Cardiovascular:     Rate and Rhythm: Normal rate and regular rhythm.     Heart sounds: Normal heart sounds. No murmur. No friction rub. No gallop.   Pulmonary:     Effort: Pulmonary effort is normal. No respiratory distress.     Breath sounds: Normal breath sounds. No stridor. No wheezing or rales.     Comments: 2L Abdominal:     General: Bowel sounds are normal. There is no distension.     Palpations: Abdomen is soft.     Tenderness: There is abdominal tenderness in the left upper quadrant and left lower quadrant. There is no guarding or rebound.  Lymphadenopathy:     Cervical: No cervical adenopathy.  Skin:    General: Skin is warm and dry.     Coloration: Skin is not pale.     Findings: No rash.  Neurological:     Mental Status: She is alert.     Coordination: Coordination normal.      ED Treatments / Results  Labs (all labs ordered are listed, but only abnormal results are displayed) Labs Reviewed  COMPREHENSIVE METABOLIC PANEL - Abnormal; Notable for the  following components:      Result Value   Glucose, Bld 155 (*)    All other components within normal limits  CBC WITH DIFFERENTIAL/PLATELET - Abnormal; Notable for the following components:   Hemoglobin 11.0 (*)    MCH 25.8 (*)    RDW 16.4 (*)    Lymphs Abs 0.5 (*)    All other components within normal limits  URINALYSIS, ROUTINE W REFLEX MICROSCOPIC - Abnormal; Notable for the following components:   APPearance CLOUDY (*)    Protein, ur 30 (*)    All other components within normal limits  SARS CORONAVIRUS 2 (HOSPITAL ORDER, [facility])  LIPASE, BLOOD    EKG None  Radiology Ct Abdomen Pelvis W Contrast  Result Date: 06/10/2019 CLINICAL DATA:  Abdominal pain and vomiting. Recurrent small bowel obstruction. EXAM: CT ABDOMEN AND PELVIS WITH CONTRAST TECHNIQUE: Multidetector CT imaging of the abdomen and pelvis was performed using the standard protocol following bolus administration of intravenous contrast. CONTRAST:  182mL OMNIPAQUE IOHEXOL 300 MG/ML  SOLN COMPARISON:  12/04/2018 CT abdomen/pelvis. FINDINGS: Lower chest: Mild dependent platelike atelectasis at the lung bases. Hepatobiliary: Normal liver size. No liver mass. Cholecystectomy. Stable mild diffuse intrahepatic biliary ductal dilatation. Stable CBD diameter 9 mm, within normal post cholecystectomy limits. No radiopaque choledocholithiasis. Pancreas: Normal, with no mass or duct dilation. Spleen: Normal size. No mass. Adrenals/Urinary Tract: Normal adrenals. Subcentimeter hypodense renal cortical lesions in the interpolar kidneys bilaterally, too small to characterize, which require no follow-up. No hydronephrosis. Normal bladder. Stomach/Bowel: Normal non-distended stomach. There are multiple moderately dilated fluid-filled proximal to mid small bowel loops measuring up to 4.3 cm diameter. Distal small bowel is collapsed. There is a caliber transition in the mid small bowel adjacent to the lower ventral  laparotomy scar (series 3/image 70). No small bowel wall thickening or pneumatosis. Appendix not discretely visualized. No pericecal inflammatory changes. Moderate sigmoid diverticulosis, with no large  bowel wall thickening or significant pericolonic fat stranding. Vascular/Lymphatic: Atherosclerotic nonaneurysmal abdominal aorta. Patent portal, splenic, hepatic and renal veins. No pathologically enlarged lymph nodes in the abdomen or pelvis. Reproductive: Status post hysterectomy, with no abnormal findings at the vaginal cuff. No adnexal mass. Other: Trace pelvic and right pericolic gutter ascites. No focal fluid collection. Mild central mesenteric edema. No free air. Musculoskeletal: No aggressive appearing focal osseous lesions. Marked lumbar spondylosis. IMPRESSION: 1. CT findings are compatible with mechanical mid small bowel obstruction at the level of the ventral lower laparotomy scar, presumably due to adhesions. 2. Trace pelvic and right paracolic gutter ascites. Mild central mesenteric edema. No focal fluid collection. No free air. No bowel wall thickening. 3. Moderate sigmoid diverticulosis. 4.  Aortic Atherosclerosis (ICD10-I70.0). Electronically Signed   By: Ilona Sorrel M.D.   On: 06/10/2019 13:32    Procedures Procedures (including critical care time)  Medications Ordered in ED Medications  sodium chloride 0.9 % bolus 500 mL (0 mLs Intravenous Stopped 06/10/19 1244)  ondansetron (ZOFRAN) injection 4 mg (4 mg Intravenous Given 06/10/19 1050)  HYDROmorphone (DILAUDID) injection 0.5 mg (0.5 mg Intravenous Given 06/10/19 1051)  iohexol (OMNIPAQUE) 300 MG/ML solution 100 mL (100 mLs Intravenous Contrast Given 06/10/19 1307)     Initial Impression / Assessment and Plan / ED Course  I have reviewed the triage vital signs and the nursing notes.  Pertinent labs & imaging results that were available during my care of the patient were reviewed by me and considered in my medical decision making (see  chart for details).        Patient presenting with abdominal pain and vomiting that began this morning.  She is found to have mechanical small bowel obstruction at the level of the ventral lower laparotomy scar, suspected due to adhesions.  Labs are within normal limits for the patient.  I discussed patient case with general surgeon on-call, Dr. Donne Hazel, who advised NG tube and admission to medicine.  He will evaluate the patient and provide any further recommendations.  I discussed patient case with Dr. Inda Merlin with [facility] who accepts patient for admission.  I appreciate the above consultants for their assistance with the patient.  Patient also evaluated by my attending, Dr. Ralene Bathe, who guided the patient's management and agrees with plan.  Final Clinical Impressions(s) / ED Diagnoses   Final diagnoses:  SBO (small bowel obstruction) [facility])    ED Discharge Orders    None       Frederica Kuster, PA-C 06/10/19 1436    Quintella Reichert, MD 06/11/19 1224

## 2019-06-10 NOTE — ED Notes (Signed)
ED TO INPATIENT HANDOFF REPORT  ED Nurse Name and Phone #:   S Name/Age/Gender Bennie Hind 78 y.o. female Room/Bed: 031C/031C  Code Status   Code Status: Full Code  Home/SNF/Other Home Patient oriented to: self, place, time and situation Is this baseline? Yes   Triage Complete: Triage complete  Chief Complaint abd pain  Triage Note PT from home with  Reported ABD pain with vomiting x 1 this AM.  Pt has previous ABD  Adhesions and blockages.  Pt tearful after Covid Adm Swab and does not want NG tube placed.  TC to Pt Daughter to report Her mother will be ADM  for bowel obs.. Pt also called  Her husband to report the plan for admission.  Pt is still tearful  after talking to husband.   Allergies Allergies  Allergen Reactions  . Cabbage Other (See Comments)    Congestion- nasal   . Latex Dermatitis    (tape)  . Mold Extract [Trichophyton] Other (See Comments)    Sinus congestion  . Morphine And Related Other (See Comments)    Hallucinations, headaches  . Mushroom Extract Complex Other (See Comments)    Sinus congestion  . Tape Dermatitis    Level of Care/Admitting Diagnosis ED Disposition    ED Disposition Condition [facility] Area: [facility] [[id number]]  Level of Care: Med-Surg [16]  I expect the patient will be discharged within 24 hours: No (not a candidate for 5C-Observation unit)  Covid Evaluation: Asymptomatic Screening Protocol (No Symptoms)  Diagnosis: SBO (small bowel obstruction) ([place]) [671245]  Admitting Physician: Karmen Bongo [[id number]]  Attending Physician: Karmen Bongo [[id number]]  PT Class (Do Not Modify): Observation [104]  PT Acc Code (Do Not Modify): Observation [10022]       B Medical/Surgery History Past Medical History:  Diagnosis Date  . Aneurysm ([place])    2 times  . Chronic headaches   . Diabetes mellitus   . Diverticulosis of sigmoid colon 04/02/2008   Dr. Oretha Ellis  . Fibromyalgia    . GERD (gastroesophageal reflux disease)   . Hypertension   . Osteoarthritis   . Ovarian cancer ([place])   . PONV (postoperative nausea and vomiting)    Past Surgical History:  Procedure Laterality Date  . ABDOMINAL HYSTERECTOMY     ovarian cancer  . BRAIN SURGERY     aneurysm  . CATARACT EXTRACTION W/PHACO Left 07/11/2018   Procedure: CATARACT EXTRACTION PHACO AND INTRAOCULAR LENS PLACEMENT ([place])  LEFT;  Surgeon: Eulogio Bear, MD;  Location: [facility];  Service: Ophthalmology;  Laterality: Left;  Diabetic - diet controlled  . CATARACT EXTRACTION W/PHACO Right 08/22/2018   Procedure: CATARACT EXTRACTION PHACO AND INTRAOCULAR LENS PLACEMENT ([place]) RIGHT;  Surgeon: Eulogio Bear, MD;  Location: [facility];  Service: Ophthalmology;  Laterality: Right;  . CHOLECYSTECTOMY    . COLONOSCOPY  04/02/2008   Dr. Oretha Ellis, [place].  Marland Kitchen COLONOSCOPY W/ BIOPSIES  09/09/1999   Dr. Durwin Reges Imam  . CRANIECTOMY  08/10/2012   Procedure: CRANIECTOMY FOR TIC DOULOUREUX;  Surgeon: Hosie Spangle, MD;  Location: [facility];  Service: Neurosurgery;  Laterality: Right;  Right Retromastoid Craniectomy with Microvascular Decompression, Decompression of the trigeminal nerve  . ESOPHAGOGASTRODUODENOSCOPY  01/07/2012   Dr. Oretha Ellis, [place].  . HERNIA REPAIR     left inguinal repair and adhesion repair     A IV Location/Drains/Wounds Patient Lines/Drains/Airways Status   Active Line/Drains/Airways  Name:   Placement date:   Placement time:   Site:   Days:   Peripheral IV 06/10/19 Anterior;Distal;Left Forearm   06/10/19    1010    Forearm   less than 1          Intake/Output Last 24 hours  Intake/Output Summary (Last 24 hours) at 06/10/2019 1504 Last data filed at 06/10/2019 1244 Gross per 24 hour  Intake 500 ml  Output -  Net 500 ml    Labs/Imaging Results for orders placed or performed during the hospital encounter of 06/10/19 (from the past 48 hour(s))  Comprehensive  metabolic panel     Status: Abnormal   Collection Time: 06/10/19 10:45 AM  Result Value Ref Range   Sodium 141 135 - 145 mmol/L   Potassium 3.7 3.5 - 5.1 mmol/L   Chloride 108 98 - 111 mmol/L   CO2 24 22 - 32 mmol/L   Glucose, Bld 155 (H) 70 - 99 mg/dL   BUN 17 8 - 23 mg/dL   Creatinine, Ser 0.88 0.44 - 1.00 mg/dL   Calcium 9.1 8.9 - 10.3 mg/dL   Total Protein 6.9 6.5 - 8.1 g/dL   Albumin 3.8 3.5 - 5.0 g/dL   AST 23 15 - 41 U/L   ALT 19 0 - 44 U/L   Alkaline Phosphatase 90 38 - 126 U/L   Total Bilirubin 0.7 0.3 - 1.2 mg/dL   GFR calc non Af Amer >60 >60 mL/min   GFR calc Af Amer >60 >60 mL/min   Anion gap 9 5 - 15    Comment: Performed at [facility], [address], [place], [place] [address]  Lipase, blood     Status: None   Collection Time: 06/10/19 10:45 AM  Result Value Ref Range   Lipase 24 11 - 51 U/L    Comment: Performed at [facility] [address], [place], [place] [address]  CBC with Differential     Status: Abnormal   Collection Time: 06/10/19 10:45 AM  Result Value Ref Range   WBC 7.1 4.0 - 10.5 K/uL   RBC 4.26 3.87 - 5.11 MIL/uL   Hemoglobin 11.0 (L) 12.0 - 15.0 g/dL   HCT 36.3 36.0 - 46.0 %   MCV 85.2 80.0 - 100.0 fL   MCH 25.8 (L) 26.0 - 34.0 pg   MCHC 30.3 30.0 - 36.0 g/dL   RDW 16.4 (H) 11.5 - 15.5 %   Platelets 245 150 - 400 K/uL   nRBC 0.0 0.0 - 0.2 %   Neutrophils Relative % 90 %   Neutro Abs 6.3 1.7 - 7.7 K/uL   Lymphocytes Relative 7 %   Lymphs Abs 0.5 (L) 0.7 - 4.0 K/uL   Monocytes Relative 3 %   Monocytes Absolute 0.2 0.1 - 1.0 K/uL   Eosinophils Relative 0 %   Eosinophils Absolute 0.0 0.0 - 0.5 K/uL   Basophils Relative 0 %   Basophils Absolute 0.0 0.0 - 0.1 K/uL   Immature Granulocytes 0 %   Abs Immature Granulocytes 0.02 0.00 - 0.07 K/uL    Comment: Performed at [facility], [place] [address], [place], [place] [address]  Urinalysis, Routine w reflex microscopic     Status: Abnormal   Collection Time: 06/10/19 12:45  PM  Result Value Ref Range   Color, Urine YELLOW YELLOW   APPearance CLOUDY (A) CLEAR   Specific Gravity, Urine 1.019 1.005 - 1.030   pH 8.0 5.0 -  8.0   Glucose, UA NEGATIVE NEGATIVE mg/dL   Hgb urine dipstick NEGATIVE NEGATIVE   Bilirubin Urine NEGATIVE NEGATIVE   Ketones, ur NEGATIVE NEGATIVE mg/dL   Protein, ur 30 (A) NEGATIVE mg/dL   Nitrite NEGATIVE NEGATIVE   Leukocytes,Ua NEGATIVE NEGATIVE   RBC / HPF 0-5 0 - 5 RBC/hpf   Bacteria, UA NONE SEEN NONE SEEN   Squamous Epithelial / LPF 0-5 0 - 5   Uric Acid Crys, UA PRESENT     Comment: Performed at [facility] [address], [place], [place] [address]   Ct Abdomen Pelvis W Contrast  Result Date: 06/10/2019 CLINICAL DATA:  Abdominal pain and vomiting. Recurrent small bowel obstruction. EXAM: CT ABDOMEN AND PELVIS WITH CONTRAST TECHNIQUE: Multidetector CT imaging of the abdomen and pelvis was performed using the standard protocol following bolus administration of intravenous contrast. CONTRAST:  124mL OMNIPAQUE IOHEXOL 300 MG/ML  SOLN COMPARISON:  12/04/2018 CT abdomen/pelvis. FINDINGS: Lower chest: Mild dependent platelike atelectasis at the lung bases. Hepatobiliary: Normal liver size. No liver mass. Cholecystectomy. Stable mild diffuse intrahepatic biliary ductal dilatation. Stable CBD diameter 9 mm, within normal post cholecystectomy limits. No radiopaque choledocholithiasis. Pancreas: Normal, with no mass or duct dilation. Spleen: Normal size. No mass. Adrenals/Urinary Tract: Normal adrenals. Subcentimeter hypodense renal cortical lesions in the interpolar kidneys bilaterally, too small to characterize, which require no follow-up. No hydronephrosis. Normal bladder. Stomach/Bowel: Normal non-distended stomach. There are multiple moderately dilated fluid-filled proximal to mid small bowel loops measuring up to 4.3 cm diameter. Distal small bowel is collapsed. There is a caliber transition in the mid small bowel adjacent to the lower  ventral laparotomy scar (series 3/image 70). No small bowel wall thickening or pneumatosis. Appendix not discretely visualized. No pericecal inflammatory changes. Moderate sigmoid diverticulosis, with no large bowel wall thickening or significant pericolonic fat stranding. Vascular/Lymphatic: Atherosclerotic nonaneurysmal abdominal aorta. Patent portal, splenic, hepatic and renal veins. No pathologically enlarged lymph nodes in the abdomen or pelvis. Reproductive: Status post hysterectomy, with no abnormal findings at the vaginal cuff. No adnexal mass. Other: Trace pelvic and right pericolic gutter ascites. No focal fluid collection. Mild central mesenteric edema. No free air. Musculoskeletal: No aggressive appearing focal osseous lesions. Marked lumbar spondylosis. IMPRESSION: 1. CT findings are compatible with mechanical mid small bowel obstruction at the level of the ventral lower laparotomy scar, presumably due to adhesions. 2. Trace pelvic and right paracolic gutter ascites. Mild central mesenteric edema. No focal fluid collection. No free air. No bowel wall thickening. 3. Moderate sigmoid diverticulosis. 4.  Aortic Atherosclerosis (ICD10-I70.0). Electronically Signed   By: Ilona Sorrel M.D.   On: 06/10/2019 13:32    Pending Labs Unresulted Labs (From admission, onward)    Start     Ordered   06/11/19 6433  Basic metabolic panel  Tomorrow morning,   R     06/10/19 1444   06/11/19 0500  CBC  Tomorrow morning,   R     06/10/19 1444   06/10/19 1452  Hemoglobin A1c  Once,   STAT     06/10/19 1451   06/10/19 1349  SARS Coronavirus 2 ([facility] - Performed in [facility]), Hosp Order  (Asymptomatic Patients Labs)  Once,   STAT    Question:  Rule Out  Answer:  Yes   06/10/19 1348          Vitals/Pain Today's Vitals   06/10/19 1207 06/10/19 1215 06/10/19 1333 06/10/19 1415  BP: 125/67 124/71 133/79 112/76  Pulse:  96 91 100  Resp: 16 18 (!) 26 20  Temp: 98 F (36.7 C)     TempSrc:  Oral     SpO2: 96% 94% 99% 99%  Weight:      Height:      PainSc:        Isolation Precautions No active isolations  Medications Medications  HYDROmorphone (DILAUDID) injection 0.5 mg (has no administration in time range)  amLODipine (NORVASC) tablet 5 mg (has no administration in time range)  fluticasone (FLONASE) 50 MCG/ACT nasal spray 2 spray (has no administration in time range)  acetaminophen (TYLENOL) tablet 650 mg (has no administration in time range)    Or  acetaminophen (TYLENOL) suppository 650 mg (has no administration in time range)  ondansetron (ZOFRAN) tablet 4 mg (has no administration in time range)    Or  ondansetron (ZOFRAN) injection 4 mg (has no administration in time range)  insulin aspart (novoLOG) injection 0-15 Units (has no administration in time range)  lactated ringers infusion (has no administration in time range)  sodium chloride 0.9 % bolus 500 mL (0 mLs Intravenous Stopped 06/10/19 1244)  ondansetron (ZOFRAN) injection 4 mg (4 mg Intravenous Given 06/10/19 1050)  HYDROmorphone (DILAUDID) injection 0.5 mg (0.5 mg Intravenous Given 06/10/19 1051)  iohexol (OMNIPAQUE) 300 MG/ML solution 100 mL (100 mLs Intravenous Contrast Given 06/10/19 1307)    Mobility walks High fall risk   Focused Assessments Cardiac Assessment Handoff:    No results found for: CKTOTAL, CKMB, CKMBINDEX, TROPONINI No results found for: DDIMER Does the Patient currently have chest pain? No      R Recommendations: See Admitting Provider Note  Report given to:   Additional Notes:

## 2019-06-10 NOTE — H&P (Signed)
History and Physical    Tonya Miranda CXK:[id number] DOB: October 05, 1941 DOA: 08/13/262  PCP: Chriss Czar, MD Consultants:  Carlean Purl - GI Patient coming from:  Home - lives with husband; NOK: Husband, [phone]  Chief Complaint: abdominal pain  HPI: Tonya Miranda is a 78 y.o. 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  She reports that she has adhesions form prior surgery and so she came in.  She has bene struggling with this for years.  She has not had to had repeat surgeries.  Her pain started last night and it was worse this time than prior.  The pain always brings on n/v.  Her pain is ok now.  She was extremely anxious at the time of my evaluation; she reports that the nurse told her she will have to have an NG tube and she simply cannot stand this without being put to sleep.     ED Course:  Multiple SBOs, including today.  Dr. Donne Hazel requests NG tube and he will see.    Review of Systems: As per HPI; otherwise review of systems reviewed and negative.   Ambulatory Status:  Ambulates without assistance  Past Medical History:  Diagnosis Date  . 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 Chronic headaches   . Diabetes mellitus   . Diverticulosis of sigmoid colon 04/02/2008   Dr. Oretha Ellis  . Fibromyalgia   . GERD (gastroesophageal reflux disease)   . Hypertension   . Osteoarthritis   . Ovarian cancer ([place])   . PONV (postoperative nausea and vomiting)     Past Surgical History:  Procedure Laterality Date  . ABDOMINAL HYSTERECTOMY     ovarian cancer  . BRAIN SURGERY     aneurysm  . CATARACT EXTRACTION W/PHACO Left 07/11/2018   Procedure: CATARACT EXTRACTION PHACO AND INTRAOCULAR LENS PLACEMENT ([place])  LEFT;  Surgeon: Eulogio Bear, MD;  Location: [facility];  Service: Ophthalmology;  Laterality: Left;  Diabetic - diet  controlled  . CATARACT EXTRACTION W/PHACO Right 08/22/2018   Procedure: CATARACT EXTRACTION PHACO AND INTRAOCULAR LENS PLACEMENT ([place]) RIGHT;  Surgeon: Eulogio Bear, MD;  Location: [facility];  Service: Ophthalmology;  Laterality: Right;  . CHOLECYSTECTOMY    . COLONOSCOPY  04/02/2008   Dr. Oretha Ellis, [place].  Marland Kitchen COLONOSCOPY W/ BIOPSIES  09/09/1999   Dr. Durwin Reges Imam  . CRANIECTOMY  08/10/2012   Procedure: CRANIECTOMY FOR TIC DOULOUREUX;  Surgeon: Hosie Spangle, MD;  Location: [facility];  Service: Neurosurgery;  Laterality: Right;  Right Retromastoid Craniectomy with Microvascular Decompression, Decompression of the trigeminal nerve  . ESOPHAGOGASTRODUODENOSCOPY  01/07/2012   Dr. Oretha Ellis, [place].  . HERNIA REPAIR     left inguinal repair and adhesion repair    Social History   Socioeconomic History  . Marital status: Married    Spouse name: Not on file  . Number of children: 3  . Years of education: Not on file  . Highest education level: Not on file  Occupational History  . Occupation: Environmental health practitioner: [employer]  Social Needs  . Financial resource strain: Not on file  . Food insecurity    Worry: Not on file    Inability: Not on file  . Transportation needs    Medical: Not on file    Non-medical: Not on file  Tobacco Use  .  Smoking status: Never Smoker  . Smokeless tobacco: Never Used  Substance and Sexual Activity  . Alcohol use: Yes    Comment: wine rarely  . Drug use: No  . Sexual activity: Not on file  Lifestyle  . Physical activity    Days per week: Not on file    Minutes per session: Not on file  . Stress: Not on file  Relationships  . Social Herbalist on phone: Not on file    Gets together: Not on file    Attends religious service: Not on file    Active member of club or organization: Not on file    Attends meetings of clubs or organizations: Not on file    Relationship status: Not on file  . Intimate partner  violence    Fear of current or ex partner: Not on file    Emotionally abused: Not on file    Physically abused: Not on file    Forced sexual activity: Not on file  Other Topics Concern  . Not on file  Social History Narrative  . Not on file    Allergies  Allergen Reactions  . Cabbage Other (See Comments)    congestion  . Latex Dermatitis    (tape)  . Morphine And Related     Hallucinations, headache    Family History  Problem Relation Age of Onset  . Mesothelioma Sister   . Bone cancer Sister   . Cancer Brother        unknown  . Colon cancer Neg Hx   . Esophageal cancer Neg Hx     Prior to Admission medications   Medication Sig Start Date End Date Taking? Authorizing Provider  acetaminophen (TYLENOL) 500 MG tablet Take 500 mg by mouth every 6 (six) hours as needed for moderate pain.     [provider]  amLODipine (NORVASC) 5 MG tablet Take 5 mg by mouth daily.  08/26/17   [provider]  Bilberry, Vaccinium myrtillus, (BILBERRY PO) Take 1 tablet by mouth daily.    [provider]  cetirizine (ZYRTEC) 10 MG tablet Take 10 mg by mouth daily.    [provider]  Cholecalciferol (VITAMIN D3) 2000 units TABS Take 1 tablet by mouth daily.     [provider]  diphenhydrAMINE (BENADRYL) 25 MG tablet Take 25 mg by mouth every 6 (six) hours as needed for allergies.     [provider]  fluticasone (FLONASE) 50 MCG/ACT nasal spray Place 2 sprays into the nose daily.    [provider]  ibuprofen (ADVIL,MOTRIN) 200 MG tablet Take 200 mg by mouth every 6 (six) hours as needed for mild pain.     [provider]  Magnesium Hydroxide (MILK OF MAGNESIA PO) Take 30 mLs by mouth daily as needed (indigestion).     [provider]  meloxicam (MOBIC) 15 MG tablet Take 15 mg by mouth daily as needed for pain.    [provider]  Multiple Vitamin (MULTIVITAMIN WITH MINERALS) TABS Take 1 tablet by mouth  daily.    [provider]  ondansetron (ZOFRAN ODT) 4 MG disintegrating tablet Take 1 tablet (4 mg total) by mouth every 8 (eight) hours as needed for nausea or vomiting. 12/06/18   Aline August, MD  pantoprazole (PROTONIX) 40 MG tablet Take 40 mg by mouth daily as needed. Heart Burn    [provider]  traMADol (ULTRAM) 50 MG tablet Take 1 tablet (50 mg  total) by mouth every 6 (six) hours as needed for moderate pain. 12/06/18 12/06/19  Aline August, MD    Physical Exam: Vitals:   06/10/19 1207 06/10/19 1215 06/10/19 1333 06/10/19 1415  BP: 125/67 124/71 133/79 112/76  Pulse:  96 91 100  Resp: 16 18 (!) 26 20  Temp: 98 F (36.7 C)     TempSrc: Oral     SpO2: 96% 94% 99% 99%  Weight:      Height:         . General:  Appears calm and comfortable and is NAD . Eyes:  PERRL, EOMI, normal lids, iris . ENT:  grossly normal hearing, lips & tongue, mildly dry mm; suboptimal dentition . Neck:  no LAD, masses or thyromegaly . Cardiovascular:  RRR, no m/r/g. No LE edema.  Marland Kitchen Respiratory:   CTA bilaterally with no wheezes/rales/rhonchi.  Normal respiratory effort. . Abdomen:  Mildly tympanic, diffusely TTP, hypoactive bowel sounds . Skin:  no rash or induration seen on limited exam . Musculoskeletal:  grossly normal tone BUE/BLE, good ROM, no bony abnormality . Psychiatric:  Very anxious mood and affect, speech fluent and appropriate, AOx3 . Neurologic:  CN 2-12 grossly intact, moves all extremities in coordinated fashion, sensation intact    Radiological Exams on Admission: Ct Abdomen Pelvis W Contrast  Result Date: 06/10/2019 CLINICAL DATA:  Abdominal pain and vomiting. Recurrent small bowel obstruction. EXAM: CT ABDOMEN AND PELVIS WITH CONTRAST TECHNIQUE: Multidetector CT imaging of the abdomen and pelvis was performed using the standard protocol following bolus administration of intravenous contrast. CONTRAST:  193mL OMNIPAQUE IOHEXOL 300 MG/ML  SOLN COMPARISON:   12/04/2018 CT abdomen/pelvis. FINDINGS: Lower chest: Mild dependent platelike atelectasis at the lung bases. Hepatobiliary: Normal liver size. No liver mass. Cholecystectomy. Stable mild diffuse intrahepatic biliary ductal dilatation. Stable CBD diameter 9 mm, within normal post cholecystectomy limits. No radiopaque choledocholithiasis. Pancreas: Normal, with no mass or duct dilation. Spleen: Normal size. No mass. Adrenals/Urinary Tract: Normal adrenals. Subcentimeter hypodense renal cortical lesions in the interpolar kidneys bilaterally, too small to characterize, which require no follow-up. No hydronephrosis. Normal bladder. Stomach/Bowel: Normal non-distended stomach. There are multiple moderately dilated fluid-filled proximal to mid small bowel loops measuring up to 4.3 cm diameter. Distal small bowel is collapsed. There is a caliber transition in the mid small bowel adjacent to the lower ventral laparotomy scar (series 3/image 70). No small bowel wall thickening or pneumatosis. Appendix not discretely visualized. No pericecal inflammatory changes. Moderate sigmoid diverticulosis, with no large bowel wall thickening or significant pericolonic fat stranding. Vascular/Lymphatic: Atherosclerotic nonaneurysmal abdominal aorta. Patent portal, splenic, hepatic and renal veins. No pathologically enlarged lymph nodes in the abdomen or pelvis. Reproductive: Status post hysterectomy, with no abnormal findings at the vaginal cuff. No adnexal mass. Other: Trace pelvic and right pericolic gutter ascites. No focal fluid collection. Mild central mesenteric edema. No free air. Musculoskeletal: No aggressive appearing focal osseous lesions. Marked lumbar spondylosis. IMPRESSION: 1. CT findings are compatible with mechanical mid small bowel obstruction at the level of the ventral lower laparotomy scar, presumably due to adhesions. 2. Trace pelvic and right paracolic gutter ascites. Mild central mesenteric edema. No focal fluid  collection. No free air. No bowel wall thickening. 3. Moderate sigmoid diverticulosis. 4.  Aortic Atherosclerosis (ICD10-I70.0). Electronically Signed   By: Ilona Sorrel M.D.   On: 06/10/2019 13:32    EKG: not done   Labs on Admission: I have personally reviewed the available labs and imaging studies at the time  of the admission.  Pertinent labs:   Glucose 155, otherwise normal CMP WBC 7.1 Hgb 11.0 - improved UA: 30 protein COVID pending   Assessment/Plan Principal Problem:   SBO (small bowel obstruction) (HCC) Active Problems:   Diabetes mellitus without complication (HCC)   HTN (hypertension)   SBO -Patient with prior extensive abdominal surgery due to ovarian cancer and subsequent multiple episodes of SBO -Acute onset of abdominal pain with n/v, abdominal distention, and CT findings c/w SBO -Will place in observation status on Med Surg -Gen Surg consulted by ER; currently no indication for surgical intervention -NPO for bowel rest -NG tube declined by patient; she was extremely anxious about this and it is reasonable to hold for now to see if she improves without -IVF hydration -Pain control with dilaudid  DM -Diet controlled at home -A1c 1 year ago was 6.2, will repeat -Will cover with moderate-scale SSI for now  HTN -Continue Norvasc    Note: This patient has been tested and is pending for the novel coronavirus COVID-19.  DVT prophylaxis:  SCDs Code Status:  Full - confirmed with patient Family Communication: None; I spoke with her husband by telephone Disposition Plan:  Home once clinically improved Consults called: Surgery  Admission status: It is my clinical opinion that referral for OBSERVATION is reasonable and necessary in this patient based on the above information provided. The aforementioned taken together are felt to place the patient at high risk for further clinical deterioration. However it is anticipated that the patient may be medically stable  for discharge from the hospital within 24 to 48 hours.    Karmen Bongo MD [facility]   How to contact the [facility] Attending or Consulting provider [place] or covering provider during after hours [place], for this patient?  1. Check the care team in [facility] and look for a) attending/consulting [facility] provider listed and b) the [facility] team listed 2. Log into [email or web address] and use [facility]'s universal password to access. If you do not have the password, please contact the hospital operator. 3. Locate the [facility] provider you are looking for under [facility] and page to a number that you can be directly reached. 4. If you still have difficulty reaching the provider, please page the [facility] (Director on Call) for the Hospitalists listed on amion for assistance.   06/10/2019, 2:47 PM

## 2019-06-10 NOTE — ED Triage Notes (Signed)
TC to Husband cell phone left message on Pt room # and floor.

## 2019-06-10 NOTE — ED Triage Notes (Signed)
Pt is still tearful  after talking to husband.

## 2019-06-10 NOTE — Progress Notes (Signed)
Patient still refused the PO Gastrografin at this time despite explanation that it is needed to determine the BO and stated that maybe tomorrow she will be ready to take it.  Patient earlier refused NGT while in ED.  Will endorse to day shift RN appropriately.  Patient not in distress.

## 2019-06-11 DIAGNOSIS — K565 Intestinal adhesions [bands], unspecified as to partial versus complete obstruction: Secondary | ICD-10-CM | POA: Diagnosis present

## 2019-06-11 DIAGNOSIS — M1990 Unspecified osteoarthritis, unspecified site: Secondary | ICD-10-CM | POA: Diagnosis present

## 2019-06-11 DIAGNOSIS — R51 Headache: Secondary | ICD-10-CM | POA: Diagnosis present

## 2019-06-11 DIAGNOSIS — Z8543 Personal history of malignant neoplasm of ovary: Secondary | ICD-10-CM | POA: Diagnosis not present

## 2019-06-11 DIAGNOSIS — K5730 Diverticulosis of large intestine without perforation or abscess without bleeding: Secondary | ICD-10-CM | POA: Diagnosis present

## 2019-06-11 DIAGNOSIS — M797 Fibromyalgia: Secondary | ICD-10-CM | POA: Diagnosis present

## 2019-06-11 DIAGNOSIS — Z91040 Latex allergy status: Secondary | ICD-10-CM | POA: Diagnosis not present

## 2019-06-11 DIAGNOSIS — K219 Gastro-esophageal reflux disease without esophagitis: Secondary | ICD-10-CM | POA: Diagnosis present

## 2019-06-11 DIAGNOSIS — I10 Essential (primary) hypertension: Secondary | ICD-10-CM | POA: Diagnosis present

## 2019-06-11 DIAGNOSIS — Z885 Allergy status to narcotic agent status: Secondary | ICD-10-CM | POA: Diagnosis not present

## 2019-06-11 DIAGNOSIS — Z91018 Allergy to other foods: Secondary | ICD-10-CM | POA: Diagnosis not present

## 2019-06-11 DIAGNOSIS — Z7951 Long term (current) use of inhaled steroids: Secondary | ICD-10-CM | POA: Diagnosis not present

## 2019-06-11 DIAGNOSIS — Z79899 Other long term (current) drug therapy: Secondary | ICD-10-CM | POA: Diagnosis not present

## 2019-06-11 DIAGNOSIS — K56609 Unspecified intestinal obstruction, unspecified as to partial versus complete obstruction: Secondary | ICD-10-CM | POA: Diagnosis not present

## 2019-06-11 DIAGNOSIS — Z961 Presence of intraocular lens: Secondary | ICD-10-CM | POA: Diagnosis present

## 2019-06-11 DIAGNOSIS — R1030 Lower abdominal pain, unspecified: Secondary | ICD-10-CM | POA: Diagnosis present

## 2019-06-11 DIAGNOSIS — Z1159 Encounter for screening for other viral diseases: Secondary | ICD-10-CM | POA: Diagnosis not present

## 2019-06-11 DIAGNOSIS — Z9842 Cataract extraction status, left eye: Secondary | ICD-10-CM | POA: Diagnosis not present

## 2019-06-11 DIAGNOSIS — E119 Type 2 diabetes mellitus without complications: Secondary | ICD-10-CM | POA: Diagnosis present

## 2019-06-11 DIAGNOSIS — Z90710 Acquired absence of both cervix and uterus: Secondary | ICD-10-CM | POA: Diagnosis not present

## 2019-06-11 DIAGNOSIS — Z9841 Cataract extraction status, right eye: Secondary | ICD-10-CM | POA: Diagnosis not present

## 2019-06-11 LAB — BASIC METABOLIC PANEL
Anion gap: 8 (ref 5–15)
BUN: 13 mg/dL (ref 8–23)
CO2: 23 mmol/L (ref 22–32)
Calcium: 8.3 mg/dL — ABNORMAL LOW (ref 8.9–10.3)
Chloride: 110 mmol/L (ref 98–111)
Creatinine, Ser: 0.83 mg/dL (ref 0.44–1.00)
GFR calc Af Amer: >60 mL/min (ref >60)
GFR calc non Af Amer: >60 mL/min (ref >60)
Glucose, Bld: 116 mg/dL — ABNORMAL HIGH (ref 70–99)
Potassium: 3.5 mmol/L (ref 3.5–5.1)
Sodium: 141 mmol/L (ref 135–145)

## 2019-06-11 LAB — CBC
HCT: 33.1 % — ABNORMAL LOW (ref 36.0–46.0)
Hemoglobin: 10 g/dL — ABNORMAL LOW (ref 12.0–15.0)
MCH: 25.8 pg — ABNORMAL LOW (ref 26.0–34.0)
MCHC: 30.2 g/dL (ref 30.0–36.0)
MCV: 85.5 fL (ref 80.0–100.0)
Platelets: 214 10*3/uL (ref 150–400)
RBC: 3.87 MIL/uL (ref 3.87–5.11)
RDW: 16.8 % — ABNORMAL HIGH (ref 11.5–15.5)
WBC: 5.2 10*3/uL (ref 4.0–10.5)
nRBC: 0 % (ref 0.0–0.2)

## 2019-06-11 LAB — GLUCOSE, CAPILLARY
Glucose-Capillary: 90 mg/dL (ref 70–99)
Glucose-Capillary: 118 mg/dL — ABNORMAL HIGH (ref 70–99)
Glucose-Capillary: 93 mg/dL (ref 70–99)
Glucose-Capillary: 75 mg/dL (ref 70–99)

## 2019-06-11 MED ORDER — ENOXAPARIN SODIUM 40 MG/0.4ML ~~LOC~~ SOLN
40.0000 mg | SUBCUTANEOUS | Status: DC
  Administered 2019-06-11 – 2019-06-12 (×2): 40 mg via SUBCUTANEOUS
  Filled 2019-06-11 (×3): qty 0.4

## 2019-06-11 NOTE — Plan of Care (Signed)
  Problem: Education: Goal: Knowledge of General Education information will improve Description Including pain rating scale, medication(s)/side effects and non-pharmacologic comfort measures Outcome: Progressing   

## 2019-06-11 NOTE — Progress Notes (Signed)
Patient ID: Tonya Miranda, female   DOB: 1941-11-12, 78 y.o.   MRN: [id number] [facility] Surgery Progress Note:   * No surgery found *  Subjective: Mental status is clear Objective: Vital signs in last 24 hours: Temp:  [98 F (36.7 C)-98.2 F (36.8 C)] 98.2 F (36.8 C) (07/05 0411) Pulse Rate:  [77-100] 77 (07/05 0411) Resp:  [13-27] 14 (07/05 0411) BP: (104-154)/(57-99) 132/67 (07/05 0411) SpO2:  [86 %-99 %] 98 % (07/05 0411) Weight:  [90.7 kg] 90.7 kg (07/04 1015)  Intake/Output from previous day: 07/04 0701 - 07/05 0700 In: 1458.2 [I.V.:958.2; IV Piggyback:500] Out: -  Intake/Output this shift: No intake/output data recorded.  Physical Exam: Work of breathing is normal; no flatus yet  Lab Results:  Results for orders placed or performed during the hospital encounter of 06/10/19 (from the past 48 hour(s))  Comprehensive metabolic panel     Status: Abnormal   Collection Time: 06/10/19 10:45 AM  Result Value Ref Range   Sodium 141 135 - 145 mmol/L   Potassium 3.7 3.5 - 5.1 mmol/L   Chloride 108 98 - 111 mmol/L   CO2 24 22 - 32 mmol/L   Glucose, Bld 155 (H) 70 - 99 mg/dL   BUN 17 8 - 23 mg/dL   Creatinine, Ser 0.88 0.44 - 1.00 mg/dL   Calcium 9.1 8.9 - 10.3 mg/dL   Total Protein 6.9 6.5 - 8.1 g/dL   Albumin 3.8 3.5 - 5.0 g/dL   AST 23 15 - 41 U/L   ALT 19 0 - 44 U/L   Alkaline Phosphatase 90 38 - 126 U/L   Total Bilirubin 0.7 0.3 - 1.2 mg/dL   GFR calc non Af Amer >60 >60 mL/min   GFR calc Af Amer >60 >60 mL/min   Anion gap 9 5 - 15    Comment: Performed at [facility], [address] [place], [place] [address]  Lipase, blood     Status: None   Collection Time: 06/10/19 10:45 AM  Result Value Ref Range   Lipase 24 11 - 51 U/L    Comment: Performed at [facility] [address] [place], [place] [address]  CBC with Differential     Status: Abnormal   Collection Time: 06/10/19 10:45 AM  Result Value Ref Range   WBC 7.1 4.0 - 10.5 K/uL   RBC 4.26 3.87 - 5.11 MIL/uL   Hemoglobin 11.0 (L) 12.0 - 15.0 g/dL   HCT 36.3 36.0 - 46.0 %   MCV 85.2 80.0 - 100.0 fL   MCH 25.8 (L) 26.0 - 34.0 pg   MCHC 30.3 30.0 - 36.0 g/dL   RDW 16.4 (H) 11.5 - 15.5 %   Platelets 245 150 - 400 K/uL   nRBC 0.0 0.0 - 0.2 %   Neutrophils Relative % 90 %   Neutro Abs 6.3 1.7 - 7.7 K/uL   Lymphocytes Relative 7 %   Lymphs Abs 0.5 (L) 0.7 - 4.0 K/uL   Monocytes Relative 3 %   Monocytes Absolute 0.2 0.1 - 1.0 K/uL   Eosinophils Relative 0 %   Eosinophils Absolute 0.0 0.0 - 0.5 K/uL   Basophils Relative 0 %   Basophils Absolute 0.0 0.0 - 0.1 K/uL   Immature Granulocytes 0 %   Abs Immature Granulocytes 0.02 0.00 - 0.07 K/uL    Comment: Performed at [facility], [place] [address] [place],  [address]  Hemoglobin A1c  Status: Abnormal   Collection Time: 06/10/19 10:45 AM  Result Value Ref Range   Hgb A1c MFr Bld 5.9 (H) 4.8 - 5.6 %    Comment: (NOTE) Pre diabetes:          5.7%-6.4% Diabetes:              >6.4% Glycemic control for   <7.0% adults with diabetes    Mean Plasma Glucose 122.63 mg/dL    Comment: Performed at [facility] [address] [place], [place] [address]  Urinalysis, Routine w reflex microscopic     Status: Abnormal   Collection Time: 06/10/19 12:45 PM  Result Value Ref Range   Color, Urine YELLOW YELLOW   APPearance CLOUDY (A) CLEAR   Specific Gravity, Urine 1.019 1.005 - 1.030   pH 8.0 5.0 - 8.0   Glucose, UA NEGATIVE NEGATIVE mg/dL   Hgb urine dipstick NEGATIVE NEGATIVE   Bilirubin Urine NEGATIVE NEGATIVE   Ketones, ur NEGATIVE NEGATIVE mg/dL   Protein, ur 30 (A) NEGATIVE mg/dL   Nitrite NEGATIVE NEGATIVE   Leukocytes,Ua NEGATIVE NEGATIVE   RBC / HPF 0-5 0 - 5 RBC/hpf   Bacteria, UA NONE SEEN NONE SEEN   Squamous Epithelial / LPF 0-5 0 - 5   Uric Acid Crys, UA PRESENT     Comment: Performed at [facility] [address], [place], [place] [address]  SARS Coronavirus 2 (CEPHEID -  Performed in [facility] hospital lab), Hosp Order     Status: None   Collection Time: 06/10/19  1:49 PM   Specimen: Nasopharyngeal Swab  Result Value Ref Range   SARS Coronavirus 2 NEGATIVE NEGATIVE    Comment: (NOTE) If result is NEGATIVE SARS-CoV-2 target nucleic acids are NOT DETECTED. The SARS-CoV-2 RNA is generally detectable in upper and lower  respiratory specimens during the acute phase of infection. The lowest  concentration of SARS-CoV-2 viral copies this assay can detect is 250  copies / mL. A negative result does not preclude SARS-CoV-2 infection  and should not be used as the sole basis for treatment or other  patient management decisions.  A negative result may occur with  improper specimen collection / handling, submission of specimen other  than nasopharyngeal swab, presence of viral mutation(s) within the  areas targeted by this assay, and inadequate number of viral copies  (<250 copies / mL). A negative result must be combined with clinical  observations, patient history, and epidemiological information. If result is POSITIVE SARS-CoV-2 target nucleic acids are DETECTED. The SARS-CoV-2 RNA is generally detectable in upper and lower  respiratory specimens dur ing the acute phase of infection.  Positive  results are indicative of active infection with SARS-CoV-2.  Clinical  correlation with patient history and other diagnostic information is  necessary to determine patient infection status.  Positive results do  not rule out bacterial infection or co-infection with other viruses. If result is PRESUMPTIVE POSTIVE SARS-CoV-2 nucleic acids MAY BE PRESENT.   A presumptive positive result was obtained on the submitted specimen  and confirmed on repeat testing.  While 2019 novel coronavirus  (SARS-CoV-2) nucleic acids may be present in the submitted sample  additional confirmatory testing may be necessary for epidemiological  and / or clinical management purposes  to  differentiate between  SARS-CoV-2 and other Sarbecovirus currently known to infect humans.  If clinically indicated additional testing with an alternate test  methodology [phone]) is advised. The SARS-CoV-2 RNA is generally  detectable in  upper and lower respiratory sp ecimens during the acute  phase of infection. The expected result is Negative. Fact Sheet for Patients:  StrictlyIdeas.no Fact Sheet for Healthcare Providers: BankingDealers.co.za This test is not yet approved or cleared by the [place] FDA and has been authorized for detection and/or diagnosis of SARS-CoV-2 by FDA under an Emergency Use Authorization (EUA).  This EUA will remain in effect (meaning this test can be used) for the duration of the COVID-19 declaration under Section 564(b)(1) of the Act, 21 U.S.C. section 360bbb-3(b)(1), unless the authorization is terminated or revoked sooner. Performed at [facility], [place] [address], [place], [place] [address]   Glucose, capillary     Status: Abnormal   Collection Time: 06/10/19  5:33 PM  Result Value Ref Range   Glucose-Capillary 112 (H) 70 - 99 mg/dL  Glucose, capillary     Status: Abnormal   Collection Time: 06/10/19  7:23 PM  Result Value Ref Range   Glucose-Capillary 131 (H) 70 - 99 mg/dL  Glucose, capillary     Status: None   Collection Time: 06/10/19 11:42 PM  Result Value Ref Range   Glucose-Capillary 93 70 - 99 mg/dL  Glucose, capillary     Status: None   Collection Time: 06/11/19  4:14 AM  Result Value Ref Range   Glucose-Capillary 90 70 - 99 mg/dL  Basic metabolic panel     Status: Abnormal   Collection Time: 06/11/19  5:55 AM  Result Value Ref Range   Sodium 141 135 - 145 mmol/L   Potassium 3.5 3.5 - 5.1 mmol/L   Chloride 110 98 - 111 mmol/L   CO2 23 22 - 32 mmol/L   Glucose, Bld 116 (H) 70 - 99 mg/dL   BUN 13 8 - 23 mg/dL   Creatinine, Ser 0.83 0.44 - 1.00 mg/dL   Calcium 8.3 (L) 8.9 -  10.3 mg/dL   GFR calc non Af Amer >60 >60 mL/min   GFR calc Af Amer >60 >60 mL/min   Anion gap 8 5 - 15    Comment: Performed at [facility], [place] [address], [place], [place] [address]  CBC     Status: Abnormal   Collection Time: 06/11/19  5:55 AM  Result Value Ref Range   WBC 5.2 4.0 - 10.5 K/uL   RBC 3.87 3.87 - 5.11 MIL/uL   Hemoglobin 10.0 (L) 12.0 - 15.0 g/dL   HCT 33.1 (L) 36.0 - 46.0 %   MCV 85.5 80.0 - 100.0 fL   MCH 25.8 (L) 26.0 - 34.0 pg   MCHC 30.2 30.0 - 36.0 g/dL   RDW 16.8 (H) 11.5 - 15.5 %   Platelets 214 150 - 400 K/uL   nRBC 0.0 0.0 - 0.2 %    Comment: Performed at [facility], [place] [address], [place], [place] [address]  Glucose, capillary     Status: Abnormal   Collection Time: 06/11/19  7:46 AM  Result Value Ref Range   Glucose-Capillary 118 (H) 70 - 99 mg/dL    Radiology/Results: Ct Abdomen Pelvis W Contrast  Result Date: 06/10/2019 CLINICAL DATA:  Abdominal pain and vomiting. Recurrent small bowel obstruction. EXAM: CT ABDOMEN AND PELVIS WITH CONTRAST TECHNIQUE: Multidetector CT imaging of the abdomen and pelvis was performed using the standard protocol following bolus administration of intravenous contrast. CONTRAST:  161mL OMNIPAQUE IOHEXOL 300 MG/ML  SOLN COMPARISON:  12/04/2018 CT abdomen/pelvis. FINDINGS: Lower chest: Mild dependent platelike atelectasis at the lung bases.  Hepatobiliary: Normal liver size. No liver mass. Cholecystectomy. Stable mild diffuse intrahepatic biliary ductal dilatation. Stable CBD diameter 9 mm, within normal post cholecystectomy limits. No radiopaque choledocholithiasis. Pancreas: Normal, with no mass or duct dilation. Spleen: Normal size. No mass. Adrenals/Urinary Tract: Normal adrenals. Subcentimeter hypodense renal cortical lesions in the interpolar kidneys bilaterally, too small to characterize, which require no follow-up. No hydronephrosis. Normal bladder. Stomach/Bowel: Normal non-distended stomach. There are  multiple moderately dilated fluid-filled proximal to mid small bowel loops measuring up to 4.3 cm diameter. Distal small bowel is collapsed. There is a caliber transition in the mid small bowel adjacent to the lower ventral laparotomy scar (series 3/image 70). No small bowel wall thickening or pneumatosis. Appendix not discretely visualized. No pericecal inflammatory changes. Moderate sigmoid diverticulosis, with no large bowel wall thickening or significant pericolonic fat stranding. Vascular/Lymphatic: Atherosclerotic nonaneurysmal abdominal aorta. Patent portal, splenic, hepatic and renal veins. No pathologically enlarged lymph nodes in the abdomen or pelvis. Reproductive: Status post hysterectomy, with no abnormal findings at the vaginal cuff. No adnexal mass. Other: Trace pelvic and right pericolic gutter ascites. No focal fluid collection. Mild central mesenteric edema. No free air. Musculoskeletal: No aggressive appearing focal osseous lesions. Marked lumbar spondylosis. IMPRESSION: 1. CT findings are compatible with mechanical mid small bowel obstruction at the level of the ventral lower laparotomy scar, presumably due to adhesions. 2. Trace pelvic and right paracolic gutter ascites. Mild central mesenteric edema. No focal fluid collection. No free air. No bowel wall thickening. 3. Moderate sigmoid diverticulosis. 4.  Aortic Atherosclerosis (ICD10-I70.0). Electronically Signed   By: Ilona Sorrel M.D.   On: 06/10/2019 13:32    Anti-infectives: Anti-infectives (From admission, onward)   None      Assessment/Plan: Problem List: Patient Active Problem List   Diagnosis Date Noted  . SBO (small bowel obstruction) ([place]) 12/04/2018  . AKI (acute kidney injury) ([place]) 12/04/2018  . HTN (hypertension) 12/04/2018  . Pain   . Legionella pneumonia ([place]) 10/01/2018  . Diabetes mellitus without complication ([place]) 28/78/6767  . Sepsis ([place]) 09/29/2018  . Lobar pneumonia ([place]) 09/29/2018  . Normocytic  anemia 09/29/2018  . Acute respiratory failure with hypoxia ([place]) 09/29/2018  . Bilateral upper lobe community acquired pneumonia ([place])   . Small bowel obstruction ([place]) 04/07/2018  . Hypertension 04/07/2018  . GERD (gastroesophageal reflux disease) 04/07/2018  . Cyclic vomiting syndrome 10/09/2013  . 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 Serum calcium elevated 10/09/2013    Patient refuses both NG and contrast;  She has had this problem many times in the past and is directing her care.  Continue IV fluids and observation as well as NPO * No surgery found *    LOS: 0 days   Matt B. Hassell Done, MD, [facility] [phone] [place] [phone]  06/11/2019 9:13 AM

## 2019-06-11 NOTE — Progress Notes (Signed)
Attempted to give PO Gastrografin to patient. Pt continually refused despite explanation and stated that she will wait for the Doctor to explain it to her. Patient is not in distress and verbalized having a good sleep last night. Will continue to monitor.

## 2019-06-11 NOTE — Progress Notes (Signed)
PROGRESS NOTE    Tonya Miranda  MVE:[id number] DOB: 11-03-1941 DOA: 01/14/3661 PCP: Chriss Czar, MD   Brief Narrative: As per HPI : 78 y.o. 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  She reports that she has adhesions form prior surgery and so she came in.  She has bene struggling with this for years.  She has not had to had repeat surgeries.  Her pain started last night and it was worse this time than prior.  The pain always brings on n/v.  Her pain is ok now.  She was extremely anxious at the time of my evaluation; she reports that the nurse told her she will have to have an NG tube and she simply cannot stand this without being put to sleep.   In ER found to have SBO surgery was consulted and patient was admitted.  Subjective: Seen this morning had some nausea.  Denies any abdominal pain, remains n.p.o. no bowel movement or flatus yet.  Does not want NG tube and reluctant to take Gastrografin-despite discussion by myself and by surgery.  Assessment & Plan:   SBO, with history of multiple hospitalizations and bowel obstruction in the past.  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  Remains n.p.o., not having bowel movements or passing flatus currently.  Discussed with surgery.  Patient has declined NG tube and this morning refused to take Gastrografin as she is saying it will make her sick.  Discussed general surgery advised to keep the patient n.p.o.,, continue hydration, encourage ambulation.  Continue pain control with IV and oral opiates.  Diabetes mellitus without complication: H4T 5.9.  Sugar well controlled, continue SSI  HTN: Well-controlled, continue home amlodipine Discussed w/ surgery  DVT prophylaxis: SCD. Add lovenox as she is considered to be high risk Code Status: FULL Family  Communication: Discussed with patient.   Disposition Plan:inpatient.  Patient continues to have persistent small bowel obstruction- not able to eat/drink orally.She will continue to require IV fluids, remains in n.p.o. status.  She is at risk for severe dehydration/acute renal failure, and further worsening of complication due to bowel obstruction and will need to stay in the hospital at least 2 midnights.  We will change her to inpatient status.  Consultants:  CCS  Procedures: None  Antimicrobials: Anti-infectives (From admission, onward)   None       Objective: Vitals:   06/10/19 1600 06/10/19 1637 06/10/19 1920 06/11/19 0411  BP: 133/90 140/70 137/77 132/67  Pulse: 99 90 87 77  Resp: 14 14 16 14   Temp:  98.1 F (36.7 C) 98 F (36.7 C) 98.2 F (36.8 C)  TempSrc:  Oral Oral Oral  SpO2: (!) 86% 97% 93% 98%  Weight:      Height:        Intake/Output Summary (Last 24 hours) at 06/11/2019 1031 Last data filed at 06/11/2019 0534 Gross per 24 hour  Intake 1458.15 ml  Output --  Net 1458.15 ml   Filed Weights   06/10/19 1015  Weight: 90.7 kg   Weight change:   Body mass index is 37.79 kg/m.  Intake/Output from previous day: 07/04 0701 - 07/05 0700 In: 1458.2 [I.V.:958.2; IV Piggyback:500] Out: -  Intake/Output this shift: No intake/output data recorded.  Examination:  General exam: Appears calm, having headache, elderly. HEENT:PERRL,Oral mucosa moist, Ear/Nose normal on gross exam Respiratory system:  Bilateral equal air entry, normal vesicular breath sounds, no wheezes or crackles  Cardiovascular system: S1 & S2 heard,No JVD, murmurs. Gastrointestinal system: Abdomen is  soft, BS, no bowel sounds, nontender.  Nervous System:Alert and oriented. No focal neurological deficits/moving extremities, sensation intact. Extremities: No edema, no clubbing, distal peripheral pulses palpable. Skin: No rashes, lesions, no icterus MSK: Normal muscle bulk,tone  ,power  Medications:  Scheduled Meds:  amLODipine  5 mg Oral Daily   diatrizoate meglumine-sodium  90 mL Per NG tube Once   fluticasone  1 spray Each Nare Daily   insulin aspart  0-15 Units Subcutaneous TID WC   Continuous Infusions:  lactated ringers 75 mL/hr at 06/11/19 0534    Data Reviewed: I have personally reviewed following labs and imaging studies  CBC: Recent Labs  Lab 06/10/19 1045 06/11/19 0555  WBC 7.1 5.2  NEUTROABS 6.3  --   HGB 11.0* 10.0*  HCT 36.3 33.1*  MCV 85.2 85.5  PLT 245 762   Basic Metabolic Panel: Recent Labs  Lab 06/10/19 1045 06/11/19 0555  NA 141 141  K 3.7 3.5  CL 108 110  CO2 24 23  GLUCOSE 155* 116*  BUN 17 13  CREATININE 0.88 0.83  CALCIUM 9.1 8.3*   GFR: Estimated Creatinine Clearance: 57.3 mL/min (by C-G formula based on SCr of 0.83 mg/dL). Liver Function Tests: Recent Labs  Lab 06/10/19 1045  AST 23  ALT 19  ALKPHOS 90  BILITOT 0.7  PROT 6.9  ALBUMIN 3.8   Recent Labs  Lab 06/10/19 1045  LIPASE 24   No results for input(s): AMMONIA in the last 168 hours. Coagulation Profile: No results for input(s): INR, PROTIME in the last 168 hours. Cardiac Enzymes: No results for input(s): CKTOTAL, CKMB, CKMBINDEX, TROPONINI in the last 168 hours. BNP (last 3 results) No results for input(s): PROBNP in the last 8760 hours. HbA1C: Recent Labs    06/10/19 1045  HGBA1C 5.9*   CBG: Recent Labs  Lab 06/10/19 1733 06/10/19 1923 06/10/19 2342 06/11/19 0414 06/11/19 0746  GLUCAP 112* 131* 93 90 118*   Lipid Profile: No results for input(s): CHOL, HDL, LDLCALC, TRIG, CHOLHDL, LDLDIRECT in the last 72 hours. Thyroid Function Tests: No results for input(s): TSH, T4TOTAL, FREET4, T3FREE, THYROIDAB in the last 72 hours. Anemia Panel: No results for input(s): VITAMINB12, FOLATE, FERRITIN, TIBC, IRON, RETICCTPCT in the last 72 hours. Sepsis Labs: No results for input(s): PROCALCITON, LATICACIDVEN in the last 168  hours.  Recent Results (from the past 240 hour(s))  SARS Coronavirus 2 (CEPHEID - Performed in [facility] hospital lab), Hosp Order     Status: None   Collection Time: 06/10/19  1:49 PM   Specimen: Nasopharyngeal Swab  Result Value Ref Range Status   SARS Coronavirus 2 NEGATIVE NEGATIVE Final    Comment: (NOTE) If result is NEGATIVE SARS-CoV-2 target nucleic acids are NOT DETECTED. The SARS-CoV-2 RNA is generally detectable in upper and lower  respiratory specimens during the acute phase of infection. The lowest  concentration of SARS-CoV-2 viral copies this assay can detect is 250  copies / mL. A negative result does not preclude SARS-CoV-2 infection  and should not be used as the sole basis for treatment or other  patient management decisions.  A negative result may occur with  improper specimen collection / handling, submission of specimen other  than nasopharyngeal swab, presence of viral mutation(s) within the  areas targeted by this assay, and inadequate number of viral copies  (<250  copies / mL). A negative result must be combined with clinical  observations, patient history, and epidemiological information. If result is POSITIVE SARS-CoV-2 target nucleic acids are DETECTED. The SARS-CoV-2 RNA is generally detectable in upper and lower  respiratory specimens dur ing the acute phase of infection.  Positive  results are indicative of active infection with SARS-CoV-2.  Clinical  correlation with patient history and other diagnostic information is  necessary to determine patient infection status.  Positive results do  not rule out bacterial infection or co-infection with other viruses. If result is PRESUMPTIVE POSTIVE SARS-CoV-2 nucleic acids MAY BE PRESENT.   A presumptive positive result was obtained on the submitted specimen  and confirmed on repeat testing.  While 2019 novel coronavirus  (SARS-CoV-2) nucleic acids may be present in the submitted sample  additional  confirmatory testing may be necessary for epidemiological  and / or clinical management purposes  to differentiate between  SARS-CoV-2 and other Sarbecovirus currently known to infect humans.  If clinically indicated additional testing with an alternate test  methodology [phone]) is advised. The SARS-CoV-2 RNA is generally  detectable in upper and lower respiratory sp ecimens during the acute  phase of infection. The expected result is Negative. Fact Sheet for Patients:  StrictlyIdeas.no Fact Sheet for Healthcare Providers: BankingDealers.co.za This test is not yet approved or cleared by the [place] FDA and has been authorized for detection and/or diagnosis of SARS-CoV-2 by FDA under an Emergency Use Authorization (EUA).  This EUA will remain in effect (meaning this test can be used) for the duration of the COVID-19 declaration under Section 564(b)(1) of the Act, 21 U.S.C. section 360bbb-3(b)(1), unless the authorization is terminated or revoked sooner. Performed at [facility], [place] [address] [place], [place] [address]       Radiology Studies: Ct Abdomen Pelvis W Contrast  Result Date: 06/10/2019 CLINICAL DATA:  Abdominal pain and vomiting. Recurrent small bowel obstruction. EXAM: CT ABDOMEN AND PELVIS WITH CONTRAST TECHNIQUE: Multidetector CT imaging of the abdomen and pelvis was performed using the standard protocol following bolus administration of intravenous contrast. CONTRAST:  150mL OMNIPAQUE IOHEXOL 300 MG/ML  SOLN COMPARISON:  12/04/2018 CT abdomen/pelvis. FINDINGS: Lower chest: Mild dependent platelike atelectasis at the lung bases. Hepatobiliary: Normal liver size. No liver mass. Cholecystectomy. Stable mild diffuse intrahepatic biliary ductal dilatation. Stable CBD diameter 9 mm, within normal post cholecystectomy limits. No radiopaque choledocholithiasis. Pancreas: Normal, with no mass or duct dilation. Spleen:  Normal size. No mass. Adrenals/Urinary Tract: Normal adrenals. Subcentimeter hypodense renal cortical lesions in the interpolar kidneys bilaterally, too small to characterize, which require no follow-up. No hydronephrosis. Normal bladder. Stomach/Bowel: Normal non-distended stomach. There are multiple moderately dilated fluid-filled proximal to mid small bowel loops measuring up to 4.3 cm diameter. Distal small bowel is collapsed. There is a caliber transition in the mid small bowel adjacent to the lower ventral laparotomy scar (series 3/image 70). No small bowel wall thickening or pneumatosis. Appendix not discretely visualized. No pericecal inflammatory changes. Moderate sigmoid diverticulosis, with no large bowel wall thickening or significant pericolonic fat stranding. Vascular/Lymphatic: Atherosclerotic nonaneurysmal abdominal aorta. Patent portal, splenic, hepatic and renal veins. No pathologically enlarged lymph nodes in the abdomen or pelvis. Reproductive: Status post hysterectomy, with no abnormal findings at the vaginal cuff. No adnexal mass. Other: Trace pelvic and right pericolic gutter ascites. No focal fluid collection. Mild central mesenteric edema. No free air. Musculoskeletal: No aggressive appearing focal osseous lesions. Marked lumbar spondylosis. IMPRESSION: 1. CT findings are  compatible with mechanical mid small bowel obstruction at the level of the ventral lower laparotomy scar, presumably due to adhesions. 2. Trace pelvic and right paracolic gutter ascites. Mild central mesenteric edema. No focal fluid collection. No free air. No bowel wall thickening. 3. Moderate sigmoid diverticulosis. 4.  Aortic Atherosclerosis (ICD10-I70.0). Electronically Signed   By: Ilona Sorrel M.D.   On: 06/10/2019 13:32      LOS: 0 days   Time spent: More than 50% of that time was spent in counseling and/or coordination of care.  Antonieta Pert, MD [facility]  06/11/2019, 10:31 AM

## 2019-06-12 ENCOUNTER — Inpatient Hospital Stay (HOSPITAL_COMMUNITY): Payer: Medicare Other

## 2019-06-12 LAB — GLUCOSE, CAPILLARY
Glucose-Capillary: 82 mg/dL (ref 70–99)
Glucose-Capillary: 89 mg/dL (ref 70–99)
Glucose-Capillary: 99 mg/dL (ref 70–99)
Glucose-Capillary: 84 mg/dL (ref 70–99)
Glucose-Capillary: 86 mg/dL (ref 70–99)

## 2019-06-12 LAB — GROUP A STREP BY PCR: Group A Strep by PCR: NOT DETECTED

## 2019-06-12 MED ORDER — DM-GUAIFENESIN ER 30-600 MG PO TB12
1.0000 | ORAL_TABLET | Freq: Two times a day (BID) | ORAL | Status: DC
  Administered 2019-06-12 – 2019-06-13 (×3): 1 via ORAL
  Filled 2019-06-12 (×3): qty 1

## 2019-06-12 MED ORDER — LORATADINE 10 MG PO TABS
10.0000 mg | ORAL_TABLET | Freq: Every day | ORAL | Status: DC
  Administered 2019-06-12 – 2019-06-13 (×2): 10 mg via ORAL
  Filled 2019-06-12 (×2): qty 1

## 2019-06-12 MED ORDER — MAGIC MOUTHWASH
5.0000 mL | Freq: Three times a day (TID) | ORAL | Status: DC
  Administered 2019-06-12 (×3): 5 mL via ORAL
  Filled 2019-06-12 (×6): qty 5

## 2019-06-12 NOTE — Progress Notes (Signed)
[facility]/Trauma Progress Note      Assessment/Plan SBO, likely adhesive - ideally ng tube decompression with gg protocol but she is refusing the NGT and gastrografin  - repeat film today showed continued bowel dilatation - having flatus and not having N or V - ideally will avoid surgery but will consider surgery if she worsens or does not have  progression  FEN: CLD VTE: SCD's, lovenox ID: none Follow up: TBD  DISPO: will give clears to see how pt does    LOS: 1 day    Subjective: CC: SOB  Abdominal pain resolved Saturday morning and pt is feeling much better. No nausea or vomiting and she is having flatus.   Objective: Vital signs in last 24 hours: Temp:  [97.9 F (36.6 C)-98.2 F (36.8 C)] 98.2 F (36.8 C) (07/06 0348) Pulse Rate:  [79-82] 82 (07/06 0348) Resp:  [17-18] 17 (07/06 0348) BP: (128-136)/(68-96) 136/68 (07/06 0348) SpO2:  [95 %-96 %] 96 % (07/06 0348) Last BM Date: 06/10/19  Intake/Output from previous day: 07/05 0701 - 07/06 0700 In: 638.2 [I.V.:638.2] Out: -  Intake/Output this shift: No intake/output data recorded.  PE: Gen:  Alert, NAD, pleasant, cooperative Pulm:  Rate and effort normal Abd: Soft, ND, +BS, well healed midline scar, very mild generalized TTP without guarding, no peritonitis  Skin: no rashes noted, warm and dry   Anti-infectives: Anti-infectives (From admission, onward)   None      Lab Results:  Recent Labs    06/10/19 1045 06/11/19 0555  WBC 7.1 5.2  HGB 11.0* 10.0*  HCT 36.3 33.1*  PLT 245 214   BMET Recent Labs    06/10/19 1045 06/11/19 0555  NA 141 141  K 3.7 3.5  CL 108 110  CO2 24 23  GLUCOSE 155* 116*  BUN 17 13  CREATININE 0.88 0.83  CALCIUM 9.1 8.3*   PT/INR No results for input(s): LABPROT, INR in the last 72 hours. CMP     Component Value Date/Time   NA 141 06/11/2019 0555   K 3.5 06/11/2019 0555   CL 110 06/11/2019 0555   CO2 23 06/11/2019 0555   GLUCOSE 116 (H)  06/11/2019 0555   BUN 13 06/11/2019 0555   CREATININE 0.83 06/11/2019 0555   CALCIUM 8.3 (L) 06/11/2019 0555   PROT 6.9 06/10/2019 1045   ALBUMIN 3.8 06/10/2019 1045   AST 23 06/10/2019 1045   ALT 19 06/10/2019 1045   ALKPHOS 90 06/10/2019 1045   BILITOT 0.7 06/10/2019 1045   GFRNONAA >60 06/11/2019 0555   GFRAA >60 06/11/2019 0555   Lipase     Component Value Date/Time   LIPASE 24 06/10/2019 1045    Studies/Results: Ct Abdomen Pelvis W Contrast  Result Date: 06/10/2019 CLINICAL DATA:  Abdominal pain and vomiting. Recurrent small bowel obstruction. EXAM: CT ABDOMEN AND PELVIS WITH CONTRAST TECHNIQUE: Multidetector CT imaging of the abdomen and pelvis was performed using the standard protocol following bolus administration of intravenous contrast. CONTRAST:  111mL OMNIPAQUE IOHEXOL 300 MG/ML  SOLN COMPARISON:  12/04/2018 CT abdomen/pelvis. FINDINGS: Lower chest: Mild dependent platelike atelectasis at the lung bases. Hepatobiliary: Normal liver size. No liver mass. Cholecystectomy. Stable mild diffuse intrahepatic biliary ductal dilatation. Stable CBD diameter 9 mm, within normal post cholecystectomy limits. No radiopaque choledocholithiasis. Pancreas: Normal, with no mass or duct dilation. Spleen: Normal size. No mass. Adrenals/Urinary Tract: Normal adrenals. Subcentimeter hypodense renal cortical lesions in the interpolar kidneys bilaterally, too small to characterize, which require no follow-up.  No hydronephrosis. Normal bladder. Stomach/Bowel: Normal non-distended stomach. There are multiple moderately dilated fluid-filled proximal to mid small bowel loops measuring up to 4.3 cm diameter. Distal small bowel is collapsed. There is a caliber transition in the mid small bowel adjacent to the lower ventral laparotomy scar (series 3/image 70). No small bowel wall thickening or pneumatosis. Appendix not discretely visualized. No pericecal inflammatory changes. Moderate sigmoid diverticulosis, with  no large bowel wall thickening or significant pericolonic fat stranding. Vascular/Lymphatic: Atherosclerotic nonaneurysmal abdominal aorta. Patent portal, splenic, hepatic and renal veins. No pathologically enlarged lymph nodes in the abdomen or pelvis. Reproductive: Status post hysterectomy, with no abnormal findings at the vaginal cuff. No adnexal mass. Other: Trace pelvic and right pericolic gutter ascites. No focal fluid collection. Mild central mesenteric edema. No free air. Musculoskeletal: No aggressive appearing focal osseous lesions. Marked lumbar spondylosis. IMPRESSION: 1. CT findings are compatible with mechanical mid small bowel obstruction at the level of the ventral lower laparotomy scar, presumably due to adhesions. 2. Trace pelvic and right paracolic gutter ascites. Mild central mesenteric edema. No focal fluid collection. No free air. No bowel wall thickening. 3. Moderate sigmoid diverticulosis. 4.  Aortic Atherosclerosis (ICD10-I70.0). Electronically Signed   By: Ilona Sorrel M.D.   On: 06/10/2019 13:32   Dg Chest Port 1 View  Result Date: 06/12/2019 CLINICAL DATA:  Hemoptysis. EXAM: PORTABLE CHEST 1 VIEW COMPARISON:  12/05/2018 FINDINGS: The heart is mildly enlarged. There is atherosclerotic calcification of the thoracic aorta. No pulmonary consolidations or pulmonary edema. IMPRESSION: 1. Cardiomegaly. 2. No evidence for acute pulmonary abnormality. 3.  Aortic atherosclerosis.  (ICD10-I70.0) Electronically Signed   By: Nolon Nations M.D.   On: 06/12/2019 10:19   Dg Abd Portable 1v  Result Date: 06/12/2019 CLINICAL DATA:  78 year old female with concern for small bowel obstruction. EXAM: PORTABLE ABDOMEN - 1 VIEW COMPARISON:  CT of the abdomen pelvis dated 06/10/2019 FINDINGS: Evaluation is limited due to body habitus. Mildly dilated air-filled loops of small bowel the left lower quadrant measure up to 4 cm in diameter. No definite free air identified. Right upper quadrant  cholecystectomy clips as well as vascular clips along the distal aorta. Degenerative changes of the spine. IMPRESSION: Persistent dilatation of small bowel measuring up to 4 cm. Continued follow-up recommended. Electronically Signed   By: Anner Crete M.D.   On: 06/12/2019 10:22      Kalman Drape , [facility] Surgery 06/12/2019, 11:20 AM  Pager: [phone] Mon-Wed, Friday 7:00am-4:30pm Thurs 7am-11:30am  Consults: [phone]

## 2019-06-12 NOTE — Progress Notes (Signed)
PROGRESS NOTE    Tonya Miranda  WNI:[id number] DOB: [birth year] DOA: [birth year]/1828 PCP: Chriss Czar, MD   Brief Narrative: As per HPI : 78 y.o. 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  She reports that she has adhesions form prior surgery and so she came in.  She has bene struggling with this for years.  She has not had to had repeat surgeries.  Her pain started last night and it was worse this time than prior.  The pain always brings on n/v.  Her pain is ok now.  She was extremely anxious at the time of my evaluation; she reports that the nurse told her she will have to have an NG tube and she simply cannot stand this without being put to sleep.   In ER found to have SBO surgery was consulted and patient was admitted.  Subjective: No further nausea no vomiting.  Passing gas.  But no BM.  No fever no chills.  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   With extensive abdominal surgery due to ovarian cancer Discussed with surgery.   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 Patient was n.p.o. we will advance diet to clear liquid diet continue hydration, encourage ambulation.  Continue pain control with IV and oral opiates.  Diabetes mellitus without complication: H3Z 5.9.  Sugar well controlled, continue SSI  HTN: Well-controlled, continue home amlodipine Discussed w/ surgery  DVT prophylaxis: SCD. Add lovenox as she is considered to be high risk Code Status: FULL Family Communication: Discussed with patient.   Disposition Plan:inpatient.  Consultants:  CCS  Procedures: None  Antimicrobials: Anti-infectives (From admission, onward)   None       Objective: Vitals:   06/11/19 0411 06/11/19  1632 06/12/19 0348 06/12/19 1509  BP: 132/67 (!) 128/96 136/68 117/66  Pulse: 77 79 82 73  Resp: 14 18 17 18   Temp: 98.2 F (36.8 C) 97.9 F (36.6 C) 98.2 F (36.8 C) 98.1 F (36.7 C)  TempSrc: Oral Oral Oral Oral  SpO2: 98% 95% 96% 96%  Weight:      Height:        Intake/Output Summary (Last 24 hours) at 06/12/2019 1809 Last data filed at 06/12/2019 1633 Gross per 24 hour  Intake 1988.06 ml  Output -  Net 1988.06 ml   Filed Weights   06/10/19 1015  Weight: 90.7 kg   Weight change:   Body mass index is 37.79 kg/m.  Intake/Output from previous day: 07/05 0701 - 07/06 0700 In: 638.2 [I.V.:638.2] Out: -  Intake/Output this shift: Total I/O In: 1988.1 [P.O.:150; I.V.:1838.1] Out: -   Examination:  General exam: Appears calm, having headache, elderly. HEENT:PERRL,Oral mucosa moist, Ear/Nose normal on gross exam Respiratory system: Bilateral equal air entry, normal vesicular breath sounds, no wheezes or crackles  Cardiovascular system: S1 & S2 heard,No JVD, murmurs. Gastrointestinal system: Abdomen is  soft, BS, no bowel sounds, nontender.  Nervous System:Alert and oriented. No focal neurological deficits/moving extremities, sensation intact. Extremities: No edema, no clubbing, distal peripheral pulses palpable. Skin: No rashes, lesions, no icterus MSK: Normal muscle bulk,tone ,power  Medications:  Scheduled Meds: . amLODipine  5 mg Oral Daily  . dextromethorphan-guaiFENesin  1 tablet Oral BID  . diatrizoate meglumine-sodium  90  mL Per NG tube Once  . enoxaparin (LOVENOX) injection  40 mg Subcutaneous Q24H  . fluticasone  1 spray Each Nare Daily  . insulin aspart  0-15 Units Subcutaneous TID WC  . loratadine  10 mg Oral Daily  . magic mouthwash  5 mL Oral TID   Continuous Infusions: . lactated ringers 75 mL/hr at 06/12/19 1633    Data Reviewed: I have personally reviewed following labs and imaging studies  CBC: Recent Labs  Lab 06/10/19 1045 06/11/19  0555  WBC 7.1 5.2  NEUTROABS 6.3  --   HGB 11.0* 10.0*  HCT 36.3 33.1*  MCV 85.2 85.5  PLT 245 818   Basic Metabolic Panel: Recent Labs  Lab 06/10/19 1045 06/11/19 0555  NA 141 141  K 3.7 3.5  CL 108 110  CO2 24 23  GLUCOSE 155* 116*  BUN 17 13  CREATININE 0.88 0.83  CALCIUM 9.1 8.3*   GFR: Estimated Creatinine Clearance: 57.3 mL/min (by C-G formula based on SCr of 0.83 mg/dL). Liver Function Tests: Recent Labs  Lab 06/10/19 1045  AST 23  ALT 19  ALKPHOS 90  BILITOT 0.7  PROT 6.9  ALBUMIN 3.8   Recent Labs  Lab 06/10/19 1045  LIPASE 24   No results for input(s): AMMONIA in the last 168 hours. Coagulation Profile: No results for input(s): INR, PROTIME in the last 168 hours. Cardiac Enzymes: No results for input(s): CKTOTAL, CKMB, CKMBINDEX, TROPONINI in the last 168 hours. BNP (last 3 results) No results for input(s): PROBNP in the last 8760 hours. HbA1C: Recent Labs    06/10/19 1045  HGBA1C 5.9*   CBG: Recent Labs  Lab 06/11/19 1631 06/11/19 1958 06/12/19 0754 06/12/19 1207 06/12/19 1702  GLUCAP 75 82 89 99 84   Lipid Profile: No results for input(s): CHOL, HDL, LDLCALC, TRIG, CHOLHDL, LDLDIRECT in the last 72 hours. Thyroid Function Tests: No results for input(s): TSH, T4TOTAL, FREET4, T3FREE, THYROIDAB in the last 72 hours. Anemia Panel: No results for input(s): VITAMINB12, FOLATE, FERRITIN, TIBC, IRON, RETICCTPCT in the last 72 hours. Sepsis Labs: No results for input(s): PROCALCITON, LATICACIDVEN in the last 168 hours.  Recent Results (from the past 240 hour(s))  SARS Coronavirus 2 (CEPHEID - Performed in [place] hospital lab), Hosp Order     Status: None   Collection Time: 06/10/19  1:49 PM   Specimen: Nasopharyngeal Swab  Result Value Ref Range Status   SARS Coronavirus 2 NEGATIVE NEGATIVE Final    Comment: (NOTE) If result is NEGATIVE SARS-CoV-2 target nucleic acids are NOT DETECTED. The SARS-CoV-2 RNA is generally  detectable in upper and lower  respiratory specimens during the acute phase of infection. The lowest  concentration of SARS-CoV-2 viral copies this assay can detect is 250  copies / mL. A negative result does not preclude SARS-CoV-2 infection  and should not be used as the sole basis for treatment or other  patient management decisions.  A negative result may occur with  improper specimen collection / handling, submission of specimen other  than nasopharyngeal swab, presence of viral mutation(s) within the  areas targeted by this assay, and inadequate number of viral copies  (<250 copies / mL). A negative result must be combined with clinical  observations, patient history, and epidemiological information. If result is POSITIVE SARS-CoV-2 target nucleic acids are DETECTED. The SARS-CoV-2 RNA is generally detectable in upper and lower  respiratory specimens dur ing the acute phase of infection.  Positive  results are indicative of  active infection with SARS-CoV-2.  Clinical  correlation with patient history and other diagnostic information is  necessary to determine patient infection status.  Positive results do  not rule out bacterial infection or co-infection with other viruses. If result is PRESUMPTIVE POSTIVE SARS-CoV-2 nucleic acids MAY BE PRESENT.   A presumptive positive result was obtained on the submitted specimen  and confirmed on repeat testing.  While 2019 novel coronavirus  (SARS-CoV-2) nucleic acids may be present in the submitted sample  additional confirmatory testing may be necessary for epidemiological  and / or clinical management purposes  to differentiate between  SARS-CoV-2 and other Sarbecovirus currently known to infect humans.  If clinically indicated additional testing with an alternate test  methodology [phone]) is advised. The SARS-CoV-2 RNA is generally  detectable in upper and lower respiratory sp ecimens during the acute  phase of infection. The  expected result is Negative. Fact Sheet for Patients:  StrictlyIdeas.no Fact Sheet for Healthcare Providers: BankingDealers.co.za This test is not yet approved or cleared by the [place] FDA and has been authorized for detection and/or diagnosis of SARS-CoV-2 by FDA under an Emergency Use Authorization (EUA).  This EUA will remain in effect (meaning this test can be used) for the duration of the COVID-19 declaration under Section 564(b)(1) of the Act, 21 U.S.C. section 360bbb-3(b)(1), unless the authorization is terminated or revoked sooner. Performed at [facility], [place] [address] [place], [place] [address]   Group A Strep by PCR     Status: None   Collection Time: 06/12/19 10:36 AM   Specimen: Throat; Sterile Swab  Result Value Ref Range Status   Group A Strep by PCR NOT DETECTED NOT DETECTED Final    Comment: Performed at [facility], [place] [address], [place], [place] [address]      Radiology Studies: Dg Chest Port 1 View  Result Date: 06/12/2019 CLINICAL DATA:  Hemoptysis. EXAM: PORTABLE CHEST 1 VIEW COMPARISON:  12/05/2018 FINDINGS: The heart is mildly enlarged. There is atherosclerotic calcification of the thoracic aorta. No pulmonary consolidations or pulmonary edema. IMPRESSION: 1. Cardiomegaly. 2. No evidence for acute pulmonary abnormality. 3.  Aortic atherosclerosis.  (ICD10-I70.0) Electronically Signed   By: Nolon Nations M.D.   On: 06/12/2019 10:19   Dg Abd Portable 1v  Result Date: 06/12/2019 CLINICAL DATA:  78 year old female with concern for small bowel obstruction. EXAM: PORTABLE ABDOMEN - 1 VIEW COMPARISON:  CT of the abdomen pelvis dated 06/10/2019 FINDINGS: Evaluation is limited due to body habitus. Mildly dilated air-filled loops of small bowel the left lower quadrant measure up to 4 cm in diameter. No definite free air identified. Right upper quadrant cholecystectomy clips as well as vascular clips  along the distal aorta. Degenerative changes of the spine. IMPRESSION: Persistent dilatation of small bowel measuring up to 4 cm. Continued follow-up recommended. Electronically Signed   By: Anner Crete M.D.   On: 06/12/2019 10:22      LOS: 1 day   Time spent: More than 50% of that time was spent in counseling and/or coordination of care.  Berle Mull, MD [facility]  06/12/2019, 6:09 PM

## 2019-06-12 NOTE — Progress Notes (Signed)
   06/12/19 1400  Clinical Encounter Type  Visited With Patient  Visit Type Initial;Spiritual support;Psychological support;Social support  Spiritual Encounters  Spiritual Needs Emotional  Stress Factors  Patient Stress Factors Loss of control;Health changes   Met w/ pt.  She is Jehovah's Witness and says that her beliefs are being respected.  Notes the support of her family and church community, including w/ multiple phone calls daily.  Life review, pt trusts that "God will heal her and if not, [she is] fine with that too."  [place] resident, [phone]

## 2019-06-13 ENCOUNTER — Inpatient Hospital Stay (HOSPITAL_COMMUNITY): Payer: Medicare Other

## 2019-06-13 ENCOUNTER — Encounter (HOSPITAL_COMMUNITY): Payer: Self-pay | Admitting: General Practice

## 2019-06-13 LAB — GLUCOSE, CAPILLARY
Glucose-Capillary: 93 mg/dL (ref 70–99)
Glucose-Capillary: 96 mg/dL (ref 70–99)

## 2019-06-13 MED ORDER — MAGNESIUM HYDROXIDE 400 MG/5ML PO SUSP
30.0000 mL | Freq: Once | ORAL | Status: AC
  Administered 2019-06-13: 11:00:00 30 mL via ORAL
  Filled 2019-06-13: qty 30

## 2019-06-13 MED ORDER — MAGIC MOUTHWASH: 5.0000 mL | Freq: Three times a day (TID) | ORAL | 0 refills | Status: DC | PRN

## 2019-06-13 NOTE — Progress Notes (Signed)
[facility] Progress Note     Subjective: CC-  No new complaints. Abdomen continues to feel better. States that she is slightly sore but has no pain. Tolerating clear liquids. Denies any n/v. Passing flatus, no BM. States that she takes milk of mag and metamucil almost daily at home, and will likely not have a BM until this is restarted.  Xray shows interim improvement of bowel distention, air noted throughout the colon.   Objective: Vital signs in last 24 hours: Temp:  [98.1 F (36.7 C)-98.5 F (36.9 C)] 98.2 F (36.8 C) (07/07 0534) Pulse Rate:  [70-74] 74 (07/07 0534) Resp:  [17-18] 17 (07/07 0534) BP: (117-146)/(66-72) 139/72 (07/07 0534) SpO2:  [94 %-96 %] 94 % (07/07 0534) Last BM Date: 06/10/19  Intake/Output from previous day: 07/06 0701 - 07/07 0700 In: 2970.8 [P.O.:150; I.V.:2820.8] Out: -  Intake/Output this shift: No intake/output data recorded.  PE: Gen:  Alert, NAD, pleasant HEENT: EOM's intact, pupils equal and round Pulm:  Rate and effort normal Abd: Soft, ND, +BS in all 4 quadrants, well healed midline scar, nontender abdomen Ext:  No BLE edema Skin: no rashes noted, warm and dry  Lab Results:  Recent Labs    06/10/19 1045 06/11/19 0555  WBC 7.1 5.2  HGB 11.0* 10.0*  HCT 36.3 33.1*  PLT 245 214   BMET Recent Labs    06/10/19 1045 06/11/19 0555  NA 141 141  K 3.7 3.5  CL 108 110  CO2 24 23  GLUCOSE 155* 116*  BUN 17 13  CREATININE 0.88 0.83  CALCIUM 9.1 8.3*   PT/INR No results for input(s): LABPROT, INR in the last 72 hours. CMP     Component Value Date/Time   NA 141 06/11/2019 0555   K 3.5 06/11/2019 0555   CL 110 06/11/2019 0555   CO2 23 06/11/2019 0555   GLUCOSE 116 (H) 06/11/2019 0555   BUN 13 06/11/2019 0555   CREATININE 0.83 06/11/2019 0555   CALCIUM 8.3 (L) 06/11/2019 0555   PROT 6.9 06/10/2019 1045   ALBUMIN 3.8 06/10/2019 1045   AST 23 06/10/2019 1045   ALT 19 06/10/2019 1045   ALKPHOS 90  06/10/2019 1045   BILITOT 0.7 06/10/2019 1045   GFRNONAA >60 06/11/2019 0555   GFRAA >60 06/11/2019 0555   Lipase     Component Value Date/Time   LIPASE 24 06/10/2019 1045       Studies/Results: Dg Chest Port 1 View  Result Date: 06/12/2019 CLINICAL DATA:  Hemoptysis. EXAM: PORTABLE CHEST 1 VIEW COMPARISON:  12/05/2018 FINDINGS: The heart is mildly enlarged. There is atherosclerotic calcification of the thoracic aorta. No pulmonary consolidations or pulmonary edema. IMPRESSION: 1. Cardiomegaly. 2. No evidence for acute pulmonary abnormality. 3.  Aortic atherosclerosis.  (ICD10-I70.0) Electronically Signed   By: Nolon Nations M.D.   On: 06/12/2019 10:19   Dg Abd Portable 1v  Result Date: 06/13/2019 CLINICAL DATA:  Small-bowel obstruction. EXAM: PORTABLE ABDOMEN - 1 VIEW COMPARISON:  06/12/2019. FINDINGS: Surgical clips are noted over the abdomen. Interim improvement of bowel distention. Air noted throughout the colon. No free air. Degenerative changes scoliosis lumbar spine and both hips. IMPRESSION: Interim improvement of bowel distention. Air noted throughout the colon. No free air. Electronically Signed   By: Marcello Moores  Register   On: 06/13/2019 08:11   Dg Abd Portable 1v  Result Date: 06/12/2019 CLINICAL DATA:  78 year old female with concern for small bowel obstruction. EXAM: PORTABLE ABDOMEN - 1 VIEW COMPARISON:  CT  of the abdomen pelvis dated 06/10/2019 FINDINGS: Evaluation is limited due to body habitus. Mildly dilated air-filled loops of small bowel the left lower quadrant measure up to 4 cm in diameter. No definite free air identified. Right upper quadrant cholecystectomy clips as well as vascular clips along the distal aorta. Degenerative changes of the spine. IMPRESSION: Persistent dilatation of small bowel measuring up to 4 cm. Continued follow-up recommended. Electronically Signed   By: Anner Crete M.D.   On: 06/12/2019 10:22    Anti-infectives: Anti-infectives (From  admission, onward)   None       Assessment/Plan HTN DM H/o ovarian cancer H/o craniectomy for aneurysmal repair  SBO, likely adhesive - patient refused ng tube decompression with gg protocol on admission - Now clinically and radiographically improving: passing flatus and tolerating clear liquids, xray shows interim improvement of bowel distention and is air noted throughout the colon.  Advance to full liquids. Add home milk of magnesia (patient wants to wait on metamucil). If she is tolerating fulls and still having bowel function she is ok for discharge later today from surgical standpoint.  FEN: FLD VTE: SCD's, lovenox ID: none Follow up: no surgical follow up   LOS: 2 days    [place]ellington [place] , [facility] Surgery 06/13/2019, 9:23 AM Pager: [phone] Mon-Thurs 7:00 am-4:30 pm Fri 7:00 am -11:30 AM Sat-Sun 7:00 am-11:30 am

## 2019-06-13 NOTE — Progress Notes (Signed)
Patient discharged to home with instructions. 

## 2019-06-13 NOTE — Discharge Instructions (Signed)
Bowel Obstruction A bowel obstruction is a blockage in the small or large bowel. The bowel, which is also called the intestine, is a long, slender tube that connects the stomach to the anus. When a person eats and drinks, food and fluids go from the mouth to the stomach to the small bowel. This is where most of the nutrients in the food and fluids are absorbed. After the small bowel, material passes through the large bowel for further absorption until any leftover material leaves the body as stool through the anus during a bowel movement. A bowel obstruction will prevent food and fluids from passing through the bowel as they normally do during digestion. The bowel can become partially or completely blocked. If this condition is not treated, it can be dangerous because the bowel could rupture. What are the causes? Common causes of this condition include:  Scar tissue (adhesions) from previous surgery or treatment with high-energy X-rays (radiation).  Recent surgery. This may cause the movements of the bowel to slow down and cause food to block the intestine.  Inflammatory bowel disease, such as Crohn's disease or diverticulitis.  Growths or tumors.  A bulging organ (hernia).  Twisting of the bowel (volvulus).  A foreign body.  Slipping of a part of the bowel into another part (intussusception). What are the signs or symptoms? Symptoms of this condition include:  Pain in the abdomen. Depending on the degree of obstruction, pain may be: ? Mild or severe. ? Dull cramping or sharp pain. ? In one area or in the entire abdomen.  Nausea and vomiting. Vomit may be greenish or a yellow bile color.  Bloating in the abdomen.  Difficulty passing stool (constipation).  Lack of passing gas.  Frequent belching.  Diarrhea. This may occur if the obstruction is partial and runny stool is able to leak around the obstruction. How is this diagnosed? This condition may be diagnosed based on:  A  physical exam.  Medical history.  Imaging tests of the abdomen or pelvis, such as X-ray or CT scan.  Blood or urine tests. How is this treated? Treatment for this condition depends on the cause and severity of the problem. Treatment may include:  Fluids and pain medicines that are given through an IV. Your health care provider may instruct you not to eat or drink if you have nausea or vomiting.  Eating a simple diet. You may be asked to consume a clear liquid diet for several days. This allows the bowel to rest.  Placement of a small tube (nasogastric tube) into the stomach. This will relieve pain, discomfort, and nausea by removing blocked air and fluids from the stomach. It can also help the obstruction clear up faster.  Surgery. This may be required if other treatments do not work. Surgery may be required for: ? Bowel obstruction from a hernia. This can be an emergency procedure. ? Scar tissue that causes frequent or severe obstructions. Follow these instructions at home: Medicines  Take over-the-counter and prescription medicines only as told by your health care provider.  If you were prescribed an antibiotic medicine, take it as told by your health care provider. Do not stop taking the antibiotic even if you start to feel better. General instructions  Follow instructions from your health care provider about eating restrictions. You may need to avoid solid foods and consume only clear liquids until your condition improves.  Return to your normal activities as told by your health care provider. Ask your health care  provider what activities are safe for you. °· Avoid sitting for a long time without moving. Get up to take short walks every 1-2 hours. This is important to improve blood flow and breathing. Ask for help if you feel weak or unsteady. °· Keep all follow-up visits as told by your health care provider. This is important. °How is this prevented? °After having a bowel  obstruction, you are more likely to have another. You may do the following things to prevent another obstruction: °· If you have a long-term (chronic) disease, pay attention to your symptoms and contact your health care provider if you have questions or concerns. °· Avoid becoming constipated. To prevent or treat constipation, your health care provider may recommend that you: °? Drink enough fluid to keep your urine pale yellow. °? Take over-the-counter or prescription medicines. °? Eat foods that are high in fiber, such as beans, whole grains, and fresh fruits and vegetables. °? Limit foods that are high in fat and processed sugars, such as fried or sweet foods. °· Stay active. Exercise for 30 minutes or more, 5 or more days each week. Ask your health care provider which exercises are safe for you. °· Avoid stress. Find ways to reduce stress, such as meditation, exercise, or taking time for activities that relax you. °· Instead of eating three large meals each day, eat three small meals with three small snacks. °· Work with a dietitian to make a healthy meal plan that works for you. °· Do not use any products that contain nicotine or tobacco, such as cigarettes and e-cigarettes. If you need help quitting, ask your health care provider. °Contact a health care provider if you: °· Have a fever. °· Have chills. °Get help right away if you: °· Have increased pain or cramping. °· Vomit blood. °· Have uncontrolled vomiting or nausea. °· Cannot drink fluids because of vomiting or pain. °· Become confused. °· Begin feeling very thirsty (dehydrated). °· Have severe bloating. °· Feel extremely weak or you faint. °Summary °· A bowel obstruction is a blockage in the small or large bowel. °· A bowel obstruction will prevent food and fluids from passing through the bowel as they normally do during digestion. °· Treatment for this condition depends on the cause and severity of the problem. It may include fluids and pain medicines  through an IV, a simple diet, a nasogastric tube, or surgery. °· Follow instructions from your health care provider about eating restrictions. You may need to avoid solid foods and consume only clear liquids until your condition improves. °This information is not intended to replace advice given to you by your health care provider. Make sure you discuss any questions you have with your health care provider. °Document Released: 02/09/2006 Document Revised: 12/30/2018 Document Reviewed: 04/06/2018 °Elsevier Patient Education © 2020 [employer] ° °
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
## 2019-08-01 IMAGING — CR PORTABLE CHEST - 1 VIEW
1 series · 1 of 1 positions shown · non-contrast
Comparison: 12/05/2018

CLINICAL DATA: Hemoptysis.

EXAM:
PORTABLE CHEST 1 VIEW

[AP]
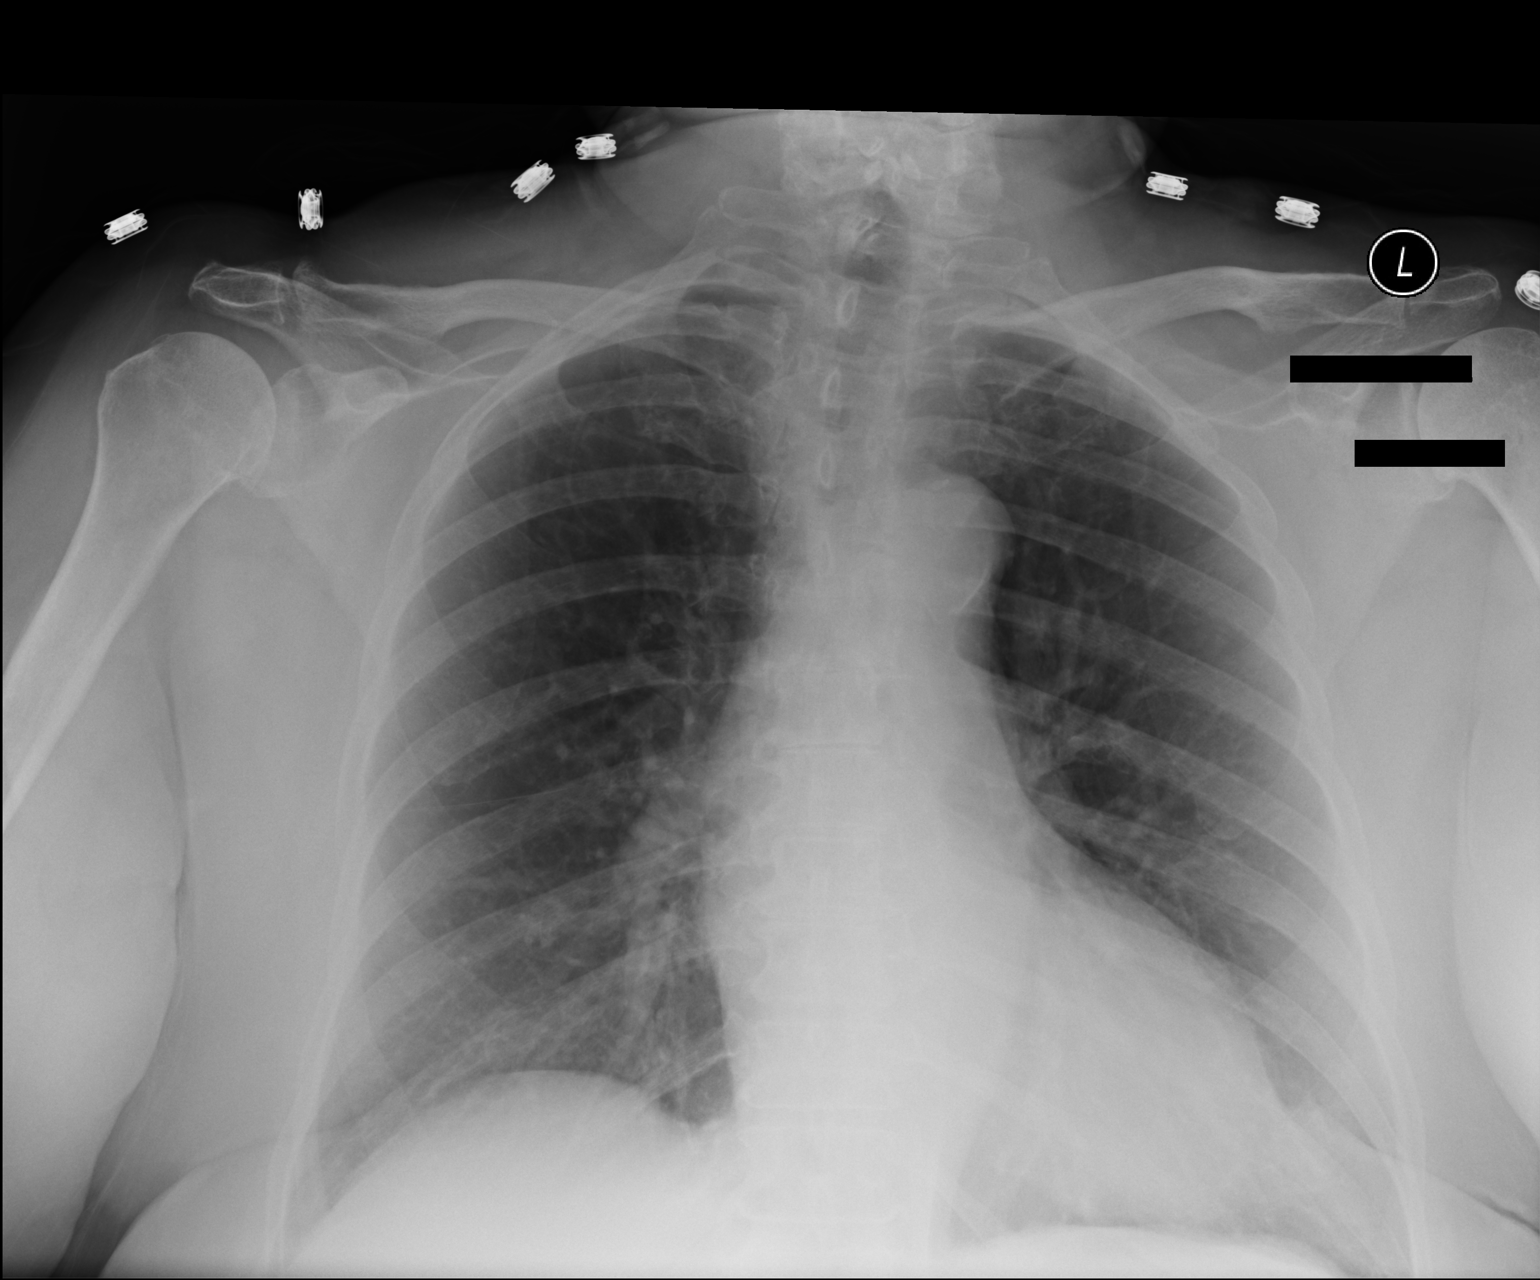

[1 of 1 positions shown; findings below may reference images not displayed]

FINDINGS: The heart is mildly enlarged. There is atherosclerotic calcification
of the thoracic aorta. No pulmonary consolidations or pulmonary
edema.
IMPRESSION: 1. Cardiomegaly.
2. No evidence for acute pulmonary abnormality.
3.  Aortic atherosclerosis.  ([id number])

## 2019-11-28 ENCOUNTER — Telehealth: Payer: Self-pay | Admitting: Internal Medicine
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
## 2020-02-21 ENCOUNTER — Other Ambulatory Visit (HOSPITAL_COMMUNITY): Payer: Self-pay | Admitting: Interventional Radiology

## 2020-02-21 DIAGNOSIS — I671 Cerebral aneurysm, nonruptured: Secondary | ICD-10-CM

## 2020-03-19 ENCOUNTER — Other Ambulatory Visit: Payer: Self-pay

## 2020-03-19 ENCOUNTER — Ambulatory Visit (HOSPITAL_COMMUNITY)
Admission: RE | Admit: 2020-03-19 | Discharge: 2020-03-19 | Disposition: A | Discharge status: Home / Self Care | Payer: Medicare Other | Source: Ambulatory Visit | Attending: Interventional Radiology | Admitting: Interventional Radiology

## 2020-03-19 DIAGNOSIS — I671 Cerebral aneurysm, nonruptured: Secondary | ICD-10-CM | POA: Diagnosis present

## 2020-03-20 ENCOUNTER — Telehealth (HOSPITAL_COMMUNITY): Payer: Self-pay

## 2020-03-20 NOTE — Telephone Encounter (Signed)
Pt agreed to f/u in 2 years with [facility] head wo. AW 

## 2020-08-08 ENCOUNTER — Ambulatory Visit (INDEPENDENT_AMBULATORY_CARE_PROVIDER_SITE_OTHER): Payer: Medicare Other | Admitting: Internal Medicine

## 2020-08-08 ENCOUNTER — Encounter: Payer: Self-pay | Admitting: Internal Medicine

## 2020-08-08 VITALS — BP 124/76 | HR 76 | Ht 60.0 in | Wt 202.0 lb

## 2020-08-08 DIAGNOSIS — K56609 Unspecified intestinal obstruction, unspecified as to partial versus complete obstruction: Secondary | ICD-10-CM

## 2020-08-08 DIAGNOSIS — R195 Other fecal abnormalities: Secondary | ICD-10-CM

## 2020-08-08 DIAGNOSIS — F418 Other specified anxiety disorders: Secondary | ICD-10-CM

## 2020-08-08 DIAGNOSIS — K5909 Other constipation: Secondary | ICD-10-CM | POA: Diagnosis not present

## 2020-08-08 NOTE — Progress Notes (Signed)
GWENEVERE Miranda 79 y.o. 1941-08-19 [id number]  Assessment & Plan:   Encounter Diagnoses  Name Primary?  . Abnormal stool color Yes  . SBO (small bowel obstruction) (HCC) -recurrent   . Chronic constipation   . Anxiety about health     She is reassured today.  I think these temporary pale stools are related to her obstructions and recovery there is no indication of significant liver disease in her.  She will return as needed.  Regarding her concerns about the possibility of needing surgery for a small bowel obstruction and wanting it to be bloodless, I told her to the best of my knowledge we always respect Jehovah's Witness wishes about blood transfusion and that long as long as she is not bleeding it would be unlikely to need blood during a surgery for small bowel obstruction and she has not been anemic which is helpful as well.  I appreciate the opportunity to care for this patient. CC: Chriss Czar, MD     Subjective:   Chief Complaint: Abnormal stool color question liver problem  HPI Tonya Miranda is here for follow-up, she is a lady with recurrent small bowel obstructions treated conservatively, with concerns about pale color of her stools after she has an obstruction.  Then she has some orange color and some mucus at times.  She looked on the Internet and she was concerned that she could have a liver drainage problem.  She remembers years ago probably 20 years ago having some sort of check on her liver and the "ducts were too small".  Not clear what that test was.  At any rate she also has some occasional itching.  There is no jaundice.  These color changes are really and not permanent and they come and go and are usually related, particularly the paler stools, to recovery from a partial small bowel obstruction or complete small bowel obstruction.  CT scan in July 2020 her last admission in [place], demonstrated 9 mm maximal common bile duct, status post cholecystectomy so  acceptable for that, without any parenchymal abnormalities and her liver tests were normal at that time.  I do not see any history of abnormal LFTs in the records we do have stored.  She manages her constipation with Metamucil and milk of magnesia.  That is stable.  Her last hospitalization was in May of this year down in [place].  She wanted to come to [facility] but there was an acute fuel shortage and they would not transport her.  She has concerns about the possibility of needing blood as she is a Sales promotion account executive Witness and she would like to have "bloodless surgery".  That is should she ever need it.  She went so far as to having a visit with a Psychologist, sport and exercise at [facility].  That was just to sort out the possibility of bloodless surgery.  This causes a great deal of stress and anxiety. Allergies  Allergen Reactions  . Other Anxiety    Unable to tolerate NG tube insertion without sedation due to past experiences.  Lennie Hummer Other (See Comments)    Congestion- nasal   . Latex Dermatitis    (tape)  . Mold Extract [Trichophyton] Other (See Comments)    Sinus congestion  . Morphine And Related Other (See Comments)    Hallucinations, headaches  . Mushroom Extract Complex Other (See Comments)    Sinus congestion  . Tape Dermatitis   Current Meds  Medication Sig  . acetaminophen (TYLENOL) 500 MG tablet  Take 500 mg by mouth every 6 (six) hours as needed (for pain).   Marland Kitchen amLODipine (NORVASC) 5 MG tablet Take 1 tablet by mouth daily.  . cetirizine (ZYRTEC) 10 MG tablet Take 10 mg by mouth at bedtime.   . diphenhydrAMINE (BENADRYL) 25 MG tablet Take 25 mg by mouth every 6 (six) hours as needed for allergies.   . fluticasone (FLONASE) 50 MCG/ACT nasal spray Place 1 spray into both nostrils daily.   Marland Kitchen ibuprofen (ADVIL,MOTRIN) 200 MG tablet Take 200 mg by mouth every 6 (six) hours as needed for mild pain.   . magnesium hydroxide (MILK OF MAGNESIA) 800 MG/5ML suspension Take 15-30 mLs by mouth at bedtime.   .  meloxicam (MOBIC) 7.5 MG tablet Take 7.5 mg by mouth 2 (two) times daily as needed for pain.  . Multiple Vitamin (MULTIVITAMIN WITH MINERALS) TABS Take 1 tablet by mouth daily.  . pantoprazole (PROTONIX) 40 MG tablet Take 40 mg by mouth at bedtime.   . psyllium (METAMUCIL) 58.6 % powder Take 1 packet by mouth daily.   Past Medical History:  Diagnosis Date  . 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 Chronic headaches   . Diabetes mellitus   . Diverticulosis of sigmoid colon 04/02/2008   Dr. Oretha Ellis  . Fibromyalgia   . GERD (gastroesophageal reflux disease)   . Hypertension   . Osteoarthritis   . Ovarian cancer ([place])   . PONV (postoperative nausea and vomiting)   . SBO (small bowel obstruction) ([place]) 06/2019   Past Surgical History:  Procedure Laterality Date  . ABDOMINAL HYSTERECTOMY     ovarian cancer  . BRAIN SURGERY     aneurysm  . CATARACT EXTRACTION W/PHACO Left 07/11/2018   Procedure: CATARACT EXTRACTION PHACO AND INTRAOCULAR LENS PLACEMENT ([place])  LEFT;  Surgeon: Eulogio Bear, MD;  Location: [place];  Service: Ophthalmology;  Laterality: Left;  Diabetic - diet controlled  . CATARACT EXTRACTION W/PHACO Right 08/22/2018   Procedure: CATARACT EXTRACTION PHACO AND INTRAOCULAR LENS PLACEMENT ([place]) RIGHT;  Surgeon: Eulogio Bear, MD;  Location: [place];  Service: Ophthalmology;  Laterality: Right;  . CHOLECYSTECTOMY    . COLONOSCOPY  04/02/2008   Dr. Oretha Ellis, [place].  Marland Kitchen COLONOSCOPY W/ BIOPSIES  09/09/1999   Dr. Durwin Reges Imam  . CRANIECTOMY  08/10/2012   Procedure: CRANIECTOMY FOR TIC DOULOUREUX;  Surgeon: Hosie Spangle, MD;  Location: [place] [facility];  Service: Neurosurgery;  Laterality: Right;  Right Retromastoid Craniectomy with Microvascular Decompression, Decompression of the trigeminal nerve  . ESOPHAGOGASTRODUODENOSCOPY  01/07/2012   Dr. Oretha Ellis, [place]ito.  . 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   Review of Systems As above  Objective:   Physical Exam BP 124/76   Pulse 76   Ht 5' (1.524 m)   Wt 202 lb (91.6 kg)   BMI 39.45 kg/m  Eyes are anicteric Elderly obese white woman in no acute distress  Total time 22 minutes

## 2020-08-08 NOTE — Patient Instructions (Signed)
Normal BMI (Body Mass Index- based on height and weight) is between 23 and 30. Your BMI today is Body mass index is 39.45 kg/m. Marland Kitchen Please consider follow up  regarding your BMI with your Primary Care Provider.   As we discussed I do not feel that you have a liver problem.  I hope you do not have any more bowel obstructions.   I appreciate the opportunity to care for you. Silvano Rusk, MD, [facility]

## 2022-06-10 ENCOUNTER — Other Ambulatory Visit (HOSPITAL_COMMUNITY): Payer: Self-pay | Admitting: Interventional Radiology

## 2022-06-10 DIAGNOSIS — I671 Cerebral aneurysm, nonruptured: Secondary | ICD-10-CM

## 2022-07-10 ENCOUNTER — Ambulatory Visit (HOSPITAL_COMMUNITY)
Admission: RE | Admit: 2022-07-10 | Discharge: 2022-07-10 | Disposition: A | Discharge status: Home / Self Care | Payer: Medicare Other | Source: Ambulatory Visit | Attending: Interventional Radiology | Admitting: Interventional Radiology

## 2022-07-10 DIAGNOSIS — I671 Cerebral aneurysm, nonruptured: Secondary | ICD-10-CM | POA: Diagnosis present

## 2022-07-15 ENCOUNTER — Telehealth (HOSPITAL_COMMUNITY): Payer: Self-pay

## 2022-07-15 NOTE — Telephone Encounter (Signed)
Called pt regarding recent imaging, no answer, no vm. AW

## 2022-12-09 DIAGNOSIS — I10 Essential (primary) hypertension: Secondary | ICD-10-CM | POA: Diagnosis not present

## 2022-12-09 DIAGNOSIS — N1831 Chronic kidney disease, stage 3a: Secondary | ICD-10-CM | POA: Diagnosis not present

## 2022-12-09 DIAGNOSIS — E1169 Type 2 diabetes mellitus with other specified complication: Secondary | ICD-10-CM | POA: Diagnosis not present

## 2022-12-09 DIAGNOSIS — E785 Hyperlipidemia, unspecified: Secondary | ICD-10-CM | POA: Diagnosis not present

## 2022-12-09 DIAGNOSIS — R519 Headache, unspecified: Secondary | ICD-10-CM | POA: Diagnosis not present

## 2022-12-09 DIAGNOSIS — K219 Gastro-esophageal reflux disease without esophagitis: Secondary | ICD-10-CM | POA: Diagnosis not present

## 2022-12-09 DIAGNOSIS — I700 Atherosclerosis of aorta: Secondary | ICD-10-CM | POA: Diagnosis not present

## 2022-12-09 DIAGNOSIS — M1990 Unspecified osteoarthritis, unspecified site: Secondary | ICD-10-CM | POA: Diagnosis not present

## 2022-12-09 DIAGNOSIS — Z79899 Other long term (current) drug therapy: Secondary | ICD-10-CM | POA: Diagnosis not present

## 2022-12-09 DIAGNOSIS — E7800 Pure hypercholesterolemia, unspecified: Secondary | ICD-10-CM | POA: Diagnosis not present

## 2022-12-16 DIAGNOSIS — N644 Mastodynia: Secondary | ICD-10-CM | POA: Diagnosis not present

## 2023-02-11 DIAGNOSIS — H43813 Vitreous degeneration, bilateral: Secondary | ICD-10-CM | POA: Diagnosis not present

## 2023-05-18 DIAGNOSIS — Z7689 Persons encountering health services in other specified circumstances: Secondary | ICD-10-CM | POA: Diagnosis not present

## 2023-05-18 DIAGNOSIS — H66004 Acute suppurative otitis media without spontaneous rupture of ear drum, recurrent, right ear: Secondary | ICD-10-CM | POA: Diagnosis not present

## 2023-05-18 DIAGNOSIS — M1990 Unspecified osteoarthritis, unspecified site: Secondary | ICD-10-CM | POA: Diagnosis not present

## 2023-05-18 DIAGNOSIS — R051 Acute cough: Secondary | ICD-10-CM | POA: Diagnosis not present

## 2023-06-17 DIAGNOSIS — Z7689 Persons encountering health services in other specified circumstances: Secondary | ICD-10-CM | POA: Diagnosis not present

## 2023-06-17 DIAGNOSIS — R109 Unspecified abdominal pain: Secondary | ICD-10-CM | POA: Diagnosis not present

## 2023-06-17 DIAGNOSIS — Z139 Encounter for screening, unspecified: Secondary | ICD-10-CM | POA: Diagnosis not present

## 2023-06-17 DIAGNOSIS — I10 Essential (primary) hypertension: Secondary | ICD-10-CM | POA: Diagnosis not present

## 2023-06-17 DIAGNOSIS — Z780 Asymptomatic menopausal state: Secondary | ICD-10-CM | POA: Diagnosis not present

## 2023-06-17 DIAGNOSIS — E119 Type 2 diabetes mellitus without complications: Secondary | ICD-10-CM | POA: Diagnosis not present

## 2023-06-17 DIAGNOSIS — Z1159 Encounter for screening for other viral diseases: Secondary | ICD-10-CM | POA: Diagnosis not present

## 2023-06-17 DIAGNOSIS — Z7189 Other specified counseling: Secondary | ICD-10-CM | POA: Diagnosis not present

## 2023-06-17 DIAGNOSIS — H9201 Otalgia, right ear: Secondary | ICD-10-CM | POA: Diagnosis not present

## 2023-06-17 DIAGNOSIS — Z13220 Encounter for screening for lipoid disorders: Secondary | ICD-10-CM | POA: Diagnosis not present

## 2023-06-17 DIAGNOSIS — Z0000 Encounter for general adult medical examination without abnormal findings: Secondary | ICD-10-CM | POA: Diagnosis not present

## 2023-09-18 DIAGNOSIS — M8589 Other specified disorders of bone density and structure, multiple sites: Secondary | ICD-10-CM | POA: Diagnosis not present

## 2023-09-18 DIAGNOSIS — M8580 Other specified disorders of bone density and structure, unspecified site: Secondary | ICD-10-CM | POA: Diagnosis not present

## 2023-09-18 DIAGNOSIS — Z13820 Encounter for screening for osteoporosis: Secondary | ICD-10-CM | POA: Diagnosis not present

## 2023-09-18 DIAGNOSIS — Z780 Asymptomatic menopausal state: Secondary | ICD-10-CM | POA: Diagnosis not present

## 2023-09-22 DIAGNOSIS — R7303 Prediabetes: Secondary | ICD-10-CM | POA: Diagnosis not present

## 2023-09-22 DIAGNOSIS — G8929 Other chronic pain: Secondary | ICD-10-CM | POA: Diagnosis not present

## 2023-09-22 DIAGNOSIS — R109 Unspecified abdominal pain: Secondary | ICD-10-CM | POA: Diagnosis not present

## 2023-09-22 DIAGNOSIS — M25511 Pain in right shoulder: Secondary | ICD-10-CM | POA: Diagnosis not present

## 2023-09-22 DIAGNOSIS — K219 Gastro-esophageal reflux disease without esophagitis: Secondary | ICD-10-CM | POA: Diagnosis not present

## 2023-09-22 DIAGNOSIS — I10 Essential (primary) hypertension: Secondary | ICD-10-CM | POA: Diagnosis not present

## 2023-09-22 DIAGNOSIS — M8589 Other specified disorders of bone density and structure, multiple sites: Secondary | ICD-10-CM | POA: Diagnosis not present

## 2023-09-22 DIAGNOSIS — M25512 Pain in left shoulder: Secondary | ICD-10-CM | POA: Diagnosis not present

## 2023-09-22 DIAGNOSIS — Z7689 Persons encountering health services in other specified circumstances: Secondary | ICD-10-CM | POA: Diagnosis not present

## 2023-09-27 DIAGNOSIS — E119 Type 2 diabetes mellitus without complications: Secondary | ICD-10-CM | POA: Diagnosis not present

## 2023-09-27 DIAGNOSIS — Z13220 Encounter for screening for lipoid disorders: Secondary | ICD-10-CM | POA: Diagnosis not present

## 2023-09-27 DIAGNOSIS — Z139 Encounter for screening, unspecified: Secondary | ICD-10-CM | POA: Diagnosis not present

## 2023-09-27 DIAGNOSIS — I10 Essential (primary) hypertension: Secondary | ICD-10-CM | POA: Diagnosis not present

## 2023-09-27 DIAGNOSIS — Z1159 Encounter for screening for other viral diseases: Secondary | ICD-10-CM | POA: Diagnosis not present

## 2023-09-29 DIAGNOSIS — M25512 Pain in left shoulder: Secondary | ICD-10-CM | POA: Diagnosis not present

## 2023-09-29 DIAGNOSIS — M19011 Primary osteoarthritis, right shoulder: Secondary | ICD-10-CM | POA: Diagnosis not present

## 2023-09-29 DIAGNOSIS — M25511 Pain in right shoulder: Secondary | ICD-10-CM | POA: Diagnosis not present

## 2023-09-29 DIAGNOSIS — M19012 Primary osteoarthritis, left shoulder: Secondary | ICD-10-CM | POA: Diagnosis not present

## 2023-10-20 DIAGNOSIS — M19012 Primary osteoarthritis, left shoulder: Secondary | ICD-10-CM | POA: Diagnosis not present

## 2023-10-20 DIAGNOSIS — Z7689 Persons encountering health services in other specified circumstances: Secondary | ICD-10-CM | POA: Diagnosis not present

## 2023-10-20 DIAGNOSIS — H66004 Acute suppurative otitis media without spontaneous rupture of ear drum, recurrent, right ear: Secondary | ICD-10-CM | POA: Diagnosis not present

## 2023-10-20 DIAGNOSIS — M1990 Unspecified osteoarthritis, unspecified site: Secondary | ICD-10-CM | POA: Diagnosis not present

## 2023-10-20 DIAGNOSIS — M19011 Primary osteoarthritis, right shoulder: Secondary | ICD-10-CM | POA: Diagnosis not present

## 2023-10-20 DIAGNOSIS — I10 Essential (primary) hypertension: Secondary | ICD-10-CM | POA: Diagnosis not present

## 2023-10-20 DIAGNOSIS — R109 Unspecified abdominal pain: Secondary | ICD-10-CM | POA: Diagnosis not present

## 2024-11-06 ENCOUNTER — Encounter: Payer: Self-pay | Admitting: Internal Medicine

## 2024-11-06 ENCOUNTER — Other Ambulatory Visit

## 2024-11-06 ENCOUNTER — Ambulatory Visit: Admitting: Internal Medicine

## 2024-11-06 VITALS — BP 136/74 | HR 73 | Ht 60.0 in | Wt 202.0 lb

## 2024-11-06 DIAGNOSIS — Z791 Long term (current) use of non-steroidal anti-inflammatories (NSAID): Secondary | ICD-10-CM

## 2024-11-06 DIAGNOSIS — Z8719 Personal history of other diseases of the digestive system: Secondary | ICD-10-CM

## 2024-11-06 DIAGNOSIS — R1011 Right upper quadrant pain: Secondary | ICD-10-CM | POA: Diagnosis not present

## 2024-11-06 DIAGNOSIS — R1012 Left upper quadrant pain: Secondary | ICD-10-CM

## 2024-11-06 DIAGNOSIS — D509 Iron deficiency anemia, unspecified: Secondary | ICD-10-CM | POA: Diagnosis not present

## 2024-11-06 LAB — CBC WITH DIFFERENTIAL/PLATELET
Basophils Absolute: 0 K/uL (ref 0.0–0.1)
Basophils Relative: 0.8 % (ref 0.0–3.0)
Eosinophils Absolute: 0.2 K/uL (ref 0.0–0.7)
Eosinophils Relative: 5 % (ref 0.0–5.0)
HCT: 27.4 % — ABNORMAL LOW (ref 36.0–46.0)
Hemoglobin: 8.4 g/dL — ABNORMAL LOW (ref 12.0–15.0)
Lymphocytes Relative: 31.7 % (ref 12.0–46.0)
Lymphs Abs: 1.5 K/uL (ref 0.7–4.0)
MCHC: 30.6 g/dL (ref 30.0–36.0)
MCV: 73.1 fl — ABNORMAL LOW (ref 78.0–100.0)
Monocytes Absolute: 0.3 K/uL (ref 0.1–1.0)
Monocytes Relative: 6.8 % (ref 3.0–12.0)
Neutro Abs: 2.6 K/uL (ref 1.4–7.7)
Neutrophils Relative %: 55.7 % (ref 43.0–77.0)
Platelets: 231 K/uL (ref 150.0–400.0)
RBC: 3.74 Mil/uL — ABNORMAL LOW (ref 3.87–5.11)
RDW: 19.7 % — ABNORMAL HIGH (ref 11.5–15.5)
WBC: 4.6 K/uL (ref 4.0–10.5)

## 2024-11-06 MED ORDER — CLENPIQ 10-3.5-12 MG-GM -GM/175ML PO SOLN: 1.0000 | ORAL | Status: DC

## 2024-11-06 NOTE — Progress Notes (Signed)
 Tonya Miranda 83 y.o. [birth year] [id number]  Assessment & Plan:   Encounter Diagnoses  Name Primary?   Iron deficiency anemia, unspecified iron deficiency anemia type Yes   NSAID long-term use    History of recurrent small bowel obstruction    Bilateral upper abdominal pain    Severe anemia with hemoglobin 7.7 and microcytic red blood cells indicating iron deficiency.  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  Low threshold for iron infusion depending upon clinical course.  No overt signs or symptoms of bleeding but is on chronic meloxicam which could lead to chronic blood loss.  Need to rule out gastrointestinal tract neoplasia also.  Upper abdominal pain could be related to known adhesion problems, could be new problems.  CT scanning in the spring was unrevealing and so his ultrasound though she does have a dilated bile duct LFTs are normal and this is stable so I do not think that is an etiology and that would not likely be related to iron deficiency anemia.  -- Scheduled upper endoscopy and colonoscopy  The risks and benefits as well as alternatives of endoscopic procedure(s) have been discussed and reviewed. All questions answered. The patient agrees to proceed.  - Continue iron supplementation until three days before procedures.  Orders Placed This Encounter  Procedures   CBC with Differential/Platelet   Vitamin B12   Folate   Iron, TIBC and Ferritin Panel   Ambulatory referral to Gastroenterology   I appreciate the opportunity to care for this patient. CC: Darcey Estefana HERO, DO      Subjective:   Chief Complaint: Microcytic anemia, new diagnosis  HPI Tonya Miranda is an 83 year old woman with a history of recurrent small bowel obstructions, status post ovarian cancer and numerous abdominal surgeries including mesh for hernia repairs who has  recently been found to have a microcytic anemia.  She saw her primary care provider on October 30, 2024, I have reviewed those notes.  She has hemoglobin 7.7 with MCV 75.8.  RDW 17.  Hemoglobin was 12.2 with normal MCV of 86 but RDW 15.9 in March.  Her primary care provider started her on ferrous sulfate about a week ago.  The patient denies seeing any rectal bleeding or melena or blood in stool.  Last CT scan reflected below, because of some of upper pain she had that was unrevealing.  She feels somewhat fatigued but has not noticed any great changes.  She has some constipation issues over time there may be some decreased frequency of bowel movements but nothing severe.  She had microscopic hematuria and a dirty urinalysis and was treated with cephalexin and she has 2 days left on that.  She gets right upper quadrant pain described as a dull ache and has had some left upper or mid abdominal pain lately.  This is vague and not related to eating.  LFTs are normal.  Lipase is pending.  She does not donate blood.  She does eat iron containing foods.  Her GI review of systems is otherwise negative at this time.  Her daughter-in-law Lorenia Hoston, RN is present for and participates in the interview.  She is the primary caregiver for her husband, Carmelia.   Discussed the use of AI scribe software for clinical note transcription with the patient, who gave verbal consent to proceed.  Abdominal pain - Dull ache localized initially to the  right upper abdomen, now also present on the left side - No associated nausea, vomiting, or dysphagia - No signs of gastrointestinal bleeding, such as hematochezia or melena - Stools have been dark, possibly related to increased dietary iron intake - History of feeling 'bilious' and gassy during prior small bowel obstruction episode  Anemia - New diagnosis of anemia with low hemoglobin levels - Hemoglobin was normal earlier this year - History of fluctuating blood counts  since childhood - No evidence of overt gastrointestinal bleeding - Recently started on iron supplementation  Altered bowel habits - Decreased frequency of bowel movements over the past year - No episodes of complete constipation - No blood in stool  Medication use - Daily Mobic (meloxicam) for arthritis, primarily affecting the right shoulder and feet/walking - Currently taking cephalexin (Keflex) for UTI with two days remaining in the course - Recently discontinued amlodipine  due to stable blood pressure without medication  Oncologic history - History of ovarian cancer       Prior relevant studies:  CT Abd/pelvis February 10, 2024 IMPRESSION:  Mildly dilated loops of small bowel in the left hemiabdomen and lower abdomen followed by decompressed loops of ileum in the right lower quadrant. A focal zone of transition cannot be readily identified however, however proximal dilatation with distal bowel decompression is a hallmark of partial small bowel obstruction.  There is a similar tapering of bowel to normal caliber in the lower anterior abdomen as seen on 12/10/2021 which may reflect partial/developing obstruction in the setting of adhesive disease. Exam End: 02/10/24 15:44   10/28/24 RUQ US  - postchole dilated bile ducts, 1.7 cm common bile duct this is stable 2018 colonoscopy - diverticulosis 2013 NL EGD  Allergies  Allergen Reactions   Other Anxiety    Unable to tolerate NG tube insertion without sedation due to past experiences.   Cabbage Other (See Comments)    Congestion- nasal    Latex Dermatitis    (tape)   Mold Extract [Trichophyton] Other (See Comments)    Sinus congestion   Morphine  And Codeine Other (See Comments)    Hallucinations, headaches   Mushroom Extract Complex (Obsolete) Other (See Comments)    Sinus congestion   Tape Dermatitis   Current Meds  Medication Sig   acetaminophen  (TYLENOL ) 500 MG tablet Take 500 mg by mouth every 6 (six) hours as needed  (for pain).    cephALEXin (KEFLEX) 500 MG capsule Take 500 mg by mouth.   cetirizine (ZYRTEC) 10 MG tablet Take 10 mg by mouth at bedtime.    diphenhydrAMINE  (BENADRYL ) 25 MG tablet Take 25 mg by mouth every 6 (six) hours as needed for allergies.    ferrous sulfate ER (SLOW FE) 142 (45 Fe) MG TBCR tablet Take 45 mg by mouth daily.   fluticasone  (FLONASE ) 50 MCG/ACT nasal spray Place 1 spray into both nostrils daily.    magnesium  hydroxide (MILK OF MAGNESIA) 800 MG/5ML suspension Take 15-30 mLs by mouth at bedtime.    meloxicam (MOBIC) 7.5 MG tablet Take 7.5 mg by mouth daily.   Multiple Vitamin (MULTIVITAMIN WITH MINERALS) TABS Take 1 tablet by mouth daily.   pantoprazole  (PROTONIX ) 40 MG tablet Take 40 mg by mouth at bedtime.    psyllium (METAMUCIL) 58.6 % powder Take 1 packet by mouth daily.   Sod Picosulfate-Mag Ox-Cit Acd (CLENPIQ ) 10-3.5-12 MG-GM -GM/175ML SOLN Take 1 kit by mouth as directed.   Past Medical History:  Diagnosis Date   Anemia    Aneurysm  2 times   Chronic headaches    Diabetes mellitus    Diverticulosis of sigmoid colon 04/02/2008   Dr. Lamar Slack   Fibromyalgia    GERD (gastroesophageal reflux disease)    Glaucoma    Hypertension    Osteoarthritis    Ovarian cancer (HCC)    PONV (postoperative nausea and vomiting)    SBO (small bowel obstruction) (HCC) 06/2019   Past Surgical History:  Procedure Laterality Date   ABDOMINAL HYSTERECTOMY     ovarian cancer   BRAIN SURGERY     aneurysm   CATARACT EXTRACTION W/PHACO Left 07/11/2018   Procedure: CATARACT EXTRACTION PHACO AND INTRAOCULAR LENS PLACEMENT ([facility])  LEFT;  Surgeon: Myrna Adine Anes, MD;  Location[facility];  Service: Ophthalmology;  Laterality: Left;  Diabetic - diet controlled   CATARACT EXTRACTION W/PHACO Right 08/22/2018   Procedure: CATARACT EXTRACTION PHACO AND INTRAOCULAR LENS PLACEMENT ([facility]) RIGHT;  Surgeon: Myrna Adine Anes, MD;  [facility];  Service:  Ophthalmology;  Laterality: Right;   CHOLECYSTECTOMY     COLONOSCOPY  04/02/2008   Dr. Lamar Slack, Jr.   COLONOSCOPY W/ BIOPSIES  09/09/1999   Dr. Lafaye Imam   CRANIECTOMY  08/10/2012   Procedure: CRANIECTOMY FOR TIC DOULOUREUX;  Surgeon: Lamar LELON Peaches, MD;  Location: [facility];  Service: Neurosurgery;  Laterality: Right;  Right Retromastoid Craniectomy with Microvascular Decompression, Decompression of the trigeminal nerve   ESOPHAGOGASTRODUODENOSCOPY  01/07/2012   Dr. Lamar Slack, [place].   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   Review of Systems As per HPI.  She has decreased hearing.  Arthritis as above some headaches.  Back pain.  Muscle cramps and itching.  Objective:   Physical Exam @BP  136/74   Pulse 73   Ht 5' (1.524 m)   Wt 202 lb (91.6 kg)   SpO2 97%   BMI 39.45 kg/m @  General:  Well-developed, well-nourished, obese and in no acute distress Eyes:  anicteric. ENT:   Mouth and posterior pharynx free of lesions.  Neck:   supple w/o thyromegaly or mass.  Lungs: Clear to auscultation bilaterally. Heart:   S1S2, no rubs, murmurs, gallops. Abdomen:  soft, non-tender, no hepatosplenomegaly, hernia, or mass and BS+.  Rectal: Deferred until colonoscopy Lymph:  no cervical or supraclavicular adenopathy. Neuro:  A&O x 3.  Psych:  appropriate mood and  Affect.   Data Reviewed: See HPI also but I have reviewed primary care notes, labs from late November, prior GI visits, prior procedures, imaging within the last year also.

## 2024-11-06 NOTE — Patient Instructions (Signed)
 Your provider has requested that you go to the basement level for lab work before leaving today. Press B on the elevator. The lab is located at the first door on the left as you exit the elevator.  Due to recent changes in healthcare laws, you may see the results of your imaging and laboratory studies on MyChart before your provider has had a chance to review them.  We understand that in some cases there may be results that are confusing or concerning to you. Not all laboratory results come back in the same time frame and the provider may be waiting for multiple results in order to interpret others.  Please give us  48 hours in order for your provider to thoroughly review all the results before contacting the office for clarification of your results.   You have been scheduled for an endoscopy and colonoscopy. Please follow the written instructions given to you at your visit today.  If you use inhalers (even only as needed), please bring them with you on the day of your procedure.  DO NOT TAKE 7 DAYS PRIOR TO TEST- Trulicity (dulaglutide) Ozempic, Wegovy (semaglutide) Mounjaro, Zepbound (tirzepatide) Bydureon Bcise (exanatide extended release)  DO NOT TAKE 1 DAY PRIOR TO YOUR TEST Rybelsus (semaglutide) Adlyxin (lixisenatide) Victoza (liraglutide) Byetta (exanatide) ___________________________________________________________________________  I appreciate the opportunity to care for you. Lupita Commander, MD, [facility]

## 2024-11-07 LAB — FOLATE: Folate: >23.7 ng/mL (ref >5.9)

## 2024-11-07 LAB — VITAMIN B12: Vitamin B-12: 159 pg/mL — ABNORMAL LOW (ref 211–911)

## 2024-11-08 ENCOUNTER — Ambulatory Visit: Payer: Self-pay | Admitting: Internal Medicine

## 2024-11-08 LAB — IRON,TIBC AND FERRITIN PANEL
%SAT: 34 % (ref 16–45)
Ferritin: 9 ng/mL — ABNORMAL LOW (ref 16–288)
Iron: 135 ug/dL (ref 45–160)
TIBC: 396 ug/dL (ref 250–450)

## 2024-11-10 ENCOUNTER — Encounter: Payer: Self-pay | Admitting: Internal Medicine

## 2024-11-17 ENCOUNTER — Ambulatory Visit: Admitting: Internal Medicine

## 2024-11-17 ENCOUNTER — Encounter: Payer: Self-pay | Admitting: Internal Medicine

## 2024-11-17 VITALS — BP 124/75 | HR 79 | Temp 97.4°F | Resp 12 | Ht 60.0 in | Wt 202.0 lb

## 2024-11-17 DIAGNOSIS — K2950 Unspecified chronic gastritis without bleeding: Secondary | ICD-10-CM | POA: Diagnosis not present

## 2024-11-17 DIAGNOSIS — K2100 Gastro-esophageal reflux disease with esophagitis, without bleeding: Secondary | ICD-10-CM | POA: Diagnosis not present

## 2024-11-17 DIAGNOSIS — R1011 Right upper quadrant pain: Secondary | ICD-10-CM

## 2024-11-17 DIAGNOSIS — D509 Iron deficiency anemia, unspecified: Secondary | ICD-10-CM

## 2024-11-17 MED ORDER — SODIUM CHLORIDE 0.9 % IV SOLN: 500.0000 mL | Freq: Once | INTRAVENOUS | Status: DC

## 2024-11-17 NOTE — Op Note (Signed)
 [facility] Patient Name: Tonya Miranda Procedure Date: 11/17/2024 1:46 PM MRN: [id number] Endoscopist: Lupita FORBES Commander , MD, [id number] Age: 83 Referring MD:  Date of Birth: 12/30/1940 Gender: Female Account #: [id number] Procedure:                Upper GI endoscopy Indications:              Upper abdominal pain, Iron deficiency anemia Medicines:                Monitored Anesthesia Care Procedure:                Pre-Anesthesia Assessment:                           - Prior to the procedure, a History and Physical                            was performed, and patient medications and                            allergies were reviewed. The patient's tolerance of                            previous anesthesia was also reviewed. The risks                            and benefits of the procedure and the sedation                            options and risks were discussed with the patient.                            All questions were answered, and informed consent                            was obtained. Prior Anticoagulants: The patient has                            taken no anticoagulant or antiplatelet agents. ASA                            Grade Assessment: III - A patient with severe                            systemic disease. After reviewing the risks and                            benefits, the patient was deemed in satisfactory                            condition to undergo the procedure.                           After obtaining informed consent, the endoscope was  passed under direct vision. Throughout the                            procedure, the patient's blood pressure, pulse, and                            oxygen saturations were monitored continuously. The                            Olympus Scope [id number] was introduced through the                            mouth, and advanced to the second part of duodenum.                             The upper GI endoscopy was accomplished without                            difficulty. The patient tolerated the procedure                            well. Scope In: Scope Out: Findings:                 A non-obstructing Schatzki ring was found at the                            gastroesophageal junction. Biopsies were taken with                            a cold forceps for histology. Verification of                            patient identification for the specimen was done.                            Estimated blood loss was minimal.                           A 2 cm hiatal hernia was present.                           Diffuse mild inflammation characterized by erythema                            and pale discoloration was found in the entire                            examined stomach. Biopsies were taken with a cold                            forceps for histology. Verification of patient                            identification for the specimen  was done. Estimated                            blood loss was minimal.                           The gastroesophageal flap valve was visualized                            endoscopically and classified as Hill Grade IV (no                            fold, wide open lumen, hiatal hernia present).                           The cardia and gastric fundus were normal on                            retroflexion.                           The exam was otherwise without abnormality.                           Biopsies for histology were taken with a cold                            forceps in the second portion of the duodenum for                            evaluation of celiac disease. Estimated blood loss                            was minimal. Complications:            No immediate complications. Estimated Blood Loss:     Estimated blood loss was minimal. Impression:               - Non-obstructing Schatzki ring. Biopsied.                           -  2 cm hiatal hernia.                           - Gastritis. Biopsied. [place] protocol.                           - Gastroesophageal flap valve classified as Hill                            Grade IV (no fold, wide open lumen, hiatal hernia                            present).                           - The examination was otherwise  normal.                           - Biopsies were taken with a cold forceps for                            evaluation of celiac disease. Recommendation:           - Patient has a contact number available for                            emergencies. The signs and symptoms of potential                            delayed complications were discussed with the                            patient. Return to normal activities tomorrow.                            Written discharge instructions were provided to the                            patient.                           - Resume previous diet.                           - Continue present medications.                           - Await pathology results.                           - See the other procedure note for documentation of                            additional recommendations. Lupita FORBES Commander, MD 11/17/2024 3:21:35 PM This report has been signed electronically.

## 2024-11-17 NOTE — Patient Instructions (Addendum)
 I did not find anything bad.  There is a small hiatal hernia and stomach irritation seen and some scar tissue where the esophagus and stomach meet.  I took biopsies of these areas and also took biopsies of the upper intestine to see if you have a problem where you do not absorb iron properly.  Colonoscopy showed diverticulosis and slightly swollen hemorrhoids.  No polyps or cancer.  Sometimes people just do not get enough iron in, either through diet or potentially from reduced acid production in the stomach.  I do not think we will need to do any other testing.  Sorry about the IV problems today.  I will be in touch with biopsy results and any further recommendations.  I appreciate the opportunity to care for you. Lupita CHARLENA Commander, MD, [facility]  Handouts given: Gastritis, Hiatal Hernia, Hemorrhoids, Diverticulosis Resume previous diet. Continue present medications.  Await pathology results. No repeat colonoscopy.  YOU HAD AN ENDOSCOPIC PROCEDURE TODAY AT THE [facility]:   Refer to the procedure report that was given to you for any specific questions about what was found during the examination.  If the procedure report does not answer your questions, please call your gastroenterologist to clarify.  If you requested that your care partner not be given the details of your procedure findings, then the procedure report has been included in a sealed envelope for you to review at your convenience later.  YOU SHOULD EXPECT: Some feelings of bloating in the abdomen. Passage of more gas than usual.  Walking can help get rid of the air that was put into your GI tract during the procedure and reduce the bloating. If you had a lower endoscopy (such as a colonoscopy or flexible sigmoidoscopy) you may notice spotting of blood in your stool or on the toilet paper. If you underwent a bowel prep for your procedure, you may not have a normal bowel movement for a few days.  Please Note:  You might  notice some irritation and congestion in your nose or some drainage.  This is from the oxygen used during your procedure.  There is no need for concern and it should clear up in a day or so.  SYMPTOMS TO REPORT IMMEDIATELY:  Following lower endoscopy (colonoscopy or flexible sigmoidoscopy):  Excessive amounts of blood in the stool  Significant tenderness or worsening of abdominal pains  Swelling of the abdomen that is new, acute  Fever of 100F or higher  Following upper endoscopy (EGD)  Vomiting of blood or coffee ground material  New chest pain or pain under the shoulder blades  Painful or persistently difficult swallowing  New shortness of breath  Fever of 100F or higher  Black, tarry-looking stools  For urgent or emergent issues, a gastroenterologist can be reached at any hour by calling [phone]. Do not use MyChart messaging for urgent concerns.    DIET:  We do recommend a small meal at first, but then you may proceed to your regular diet.  Drink plenty of fluids but you should avoid alcoholic beverages for 24 hours.  ACTIVITY:  You should plan to take it easy for the rest of today and you should NOT DRIVE or use heavy machinery until tomorrow (because of the sedation medicines used during the test).    FOLLOW UP: Our staff will call the number listed on your records the next business day following your procedure.  We will call around 7:15- 8:00 am to check on you and address  any questions or concerns that you may have regarding the information given to you following your procedure. If we do not reach you, we will leave a message.     If any biopsies were taken you will be contacted by phone or by letter within the next 1-3 weeks.  Please call us  at [phone] if you have not heard about the biopsies in 3 weeks.    SIGNATURES/CONFIDENTIALITY: You and/or your care partner have signed paperwork which will be entered into your electronic medical record.  These signatures  attest to the fact that that the information above on your After Visit Summary has been reviewed and is understood.  Full responsibility of the confidentiality of this discharge information lies with you and/or your care-partner.

## 2024-11-17 NOTE — Op Note (Signed)
 [facility] Patient Name: Tonya Miranda Procedure Date: 11/17/2024 1:36 PM MRN: [id number] Endoscopist: Lupita FORBES Commander , MD, [id number] Age: 83 Referring MD:  Date of Birth: [birth year] Gender: Female Account #: [id number] Procedure:                Colonoscopy Indications:              Iron deficiency anemia Medicines:                Monitored Anesthesia Care Procedure:                Pre-Anesthesia Assessment:                           - Prior to the procedure, a History and Physical                            was performed, and patient medications and                            allergies were reviewed. The patient's tolerance of                            previous anesthesia was also reviewed. The risks                            and benefits of the procedure and the sedation                            options and risks were discussed with the patient.                            All questions were answered, and informed consent                            was obtained. Prior Anticoagulants: The patient has                            taken no anticoagulant or antiplatelet agents. ASA                            Grade Assessment: III - A patient with severe                            systemic disease. After reviewing the risks and                            benefits, the patient was deemed in satisfactory                            condition to undergo the procedure.                           After obtaining informed consent, the colonoscope  was passed under direct vision. Throughout the                            procedure, the patient's blood pressure, pulse, and                            oxygen saturations were monitored continuously. The                            Olympus Scope [id number] was introduced through the                            anus and advanced to the the terminal ileum, with                            identification of the  appendiceal orifice and IC                            valve. The colonoscopy was performed without                            difficulty. The patient tolerated the procedure                            well. The quality of the bowel preparation was                            adequate. The terminal ileum, ileocecal valve,                            appendiceal orifice, and rectum were photographed.                            The bowel preparation used was Clenpiq  via split                            dose instruction. Scope In: 2:54:16 PM Scope Out: 3:13:48 PM Scope Withdrawal Time: 0 hours 14 minutes 0 seconds  Total Procedure Duration: 0 hours 19 minutes 32 seconds  Findings:                 The perianal and digital rectal examinations were                            normal.                           The terminal ileum appeared normal.                           Multiple diverticula were found in the sigmoid                            colon, descending colon and ascending colon.  Internal hemorrhoids were found during retroflexion.                           The exam was otherwise without abnormality on                            direct and retroflexion views. Complications:            No immediate complications. Estimated Blood Loss:     Estimated blood loss: none. Impression:               - The examined portion of the ileum was normal.                           - Diverticulosis in the sigmoid colon, in the                            descending colon and in the ascending colon.                           - Internal hemorrhoids.                           - The examination was otherwise normal on direct                            and retroflexion views.                           - No specimens collected. Recommendation:           - Patient has a contact number available for                            emergencies. The signs and symptoms of potential                             delayed complications were discussed with the                            patient. Return to normal activities tomorrow.                            Written discharge instructions were provided to the                            patient.                           - Resume previous diet.                           - Continue present medications.                           - No repeat colonoscopy. Lupita FORBES Commander, MD 11/17/2024 3:28:40 PM This report has been signed electronically.

## 2024-11-17 NOTE — Progress Notes (Signed)
 Pt's states no medical or surgical changes since previsit or office visit.

## 2024-11-17 NOTE — Progress Notes (Signed)
 Report to PACU, RN, vss, BBS= Clear.

## 2024-11-17 NOTE — Progress Notes (Signed)
 History and Physical Interval Note:  11/17/2024 1:40 PM  Tonya Miranda  has presented today for endoscopic procedure(s), with the diagnosis of  Encounter Diagnoses  Name Primary?   Iron deficiency anemia, unspecified iron deficiency anemia type Yes   Bilateral upper abdominal pain   .  The various methods of evaluation and treatment have been discussed with the patient and/or family. After consideration of risks, benefits and other options for treatment, the patient has consented to  the endoscopic procedure(s).   The patient's history has been reviewed, patient examined, no change in status, stable for endoscopic procedure(s).  I have reviewed the patient's chart and labs.  Questions were answered to the patient's satisfaction.     Lupita CHARLENA Commander, MD, [facility]

## 2024-11-17 NOTE — Progress Notes (Signed)
 Called to room to assist during endoscopic procedure.  Patient ID and intended procedure confirmed with present staff. Received instructions for my participation in the procedure from the performing physician.

## 2024-11-20 ENCOUNTER — Telehealth: Payer: Self-pay | Admitting: *Deleted

## 2024-11-20 NOTE — Telephone Encounter (Signed)
°  Follow up Call-     11/17/2024    1:16 PM  Call back number  Post procedure Call Back phone  # [phone]  Permission to leave phone message Yes     Patient questions:  Do you have a fever, pain , or abdominal swelling? Yes.   Pain Score  0 *  Have you tolerated food without any problems? Yes.    Have you been able to return to your normal activities? Yes.    Do you have any questions about your discharge instructions: Diet   No. Medications  No. Follow up visit  No.  Do you have questions or concerns about your Care? No.  Actions: * If pain score is 4 or above: No action needed, pain <4.

## 2024-11-23 LAB — SURGICAL PATHOLOGY

## 2024-11-27 ENCOUNTER — Ambulatory Visit: Payer: Self-pay | Admitting: Internal Medicine

## 2024-12-12 NOTE — Progress Notes (Unsigned)
 "  [facility]  PATIENT: Tonya Miranda DOB: Nov 10, 1941  REFERRING DOCTOR OR PCP:  Levorn Lia, DO SOURCE: Patient, notes from interventional neuro radiology, imaging and lab reports, imaging studies personally reviewed   CHIEF COMPLAINT:  Chief Complaint  Patient presents with   New Patient (Initial Visit)    Rm11, daughter present, Internal referral from [facility]/Claire Repine DO [id number] aneurysm: vision test completed    HISTORY OF PRESENT ILLNESS:  I had the pleasure of seeing patient, Tonya Miranda, at [facility] for neurologic consultation regarding her history of cerebral aneurysm  She is an 84 year old woman with hypertension who was found to have cerebral aneurysms in 2012.  I reviewed the study.   The MR angiogram showed a 6.6 x 3.6 x 3.4 mm aneurysm directed inferiorly and laterally arising from the supraclinoid internal carotid artery.  Additionally, there was a 1.3 mm aneurysm at the proximal A1 segment of the left anterior cerebral artery..  Dr. Dolphus did a coiling of her left supraclinoid/P.Comm aneurysm without complication..    He had followed her annually but due to his recent retirement she is transferring care.   Follow-up MR angiogram every 2 years also has shown minimal flow related enhancement at the anterior aspect of the left ICA coil.  The ACA aneurysm is essentially unchanged at less than 2 mm.  She also has trigeminal neuralgia on the right .   She had right retromastoid craniectomy for microvascular decompression of the right trigeminal nerve  by Dr. Alix in 2013.   Pain improved afterwards.  She has not needed any further treatment.   She does have migraine headaches.  THese occur irregularly - sometimes days in a row and other times none x 2 months   Tylenol  usually helps to eliminate the pain.     Imaging: MRI of the head 02/27/2015 was normal for age with just minimal chronic microvascular  ischemic changes  MR angiogram 07/10/2022 shows status post left supsuICA/P-comm aneurysm coiling.  Stable 2 mm aneurysm of the left ICA and left ACA A1 segment.  The right ACA A1 segment is absent.   REVIEW OF SYSTEMS: Constitutional: No fevers, chills, sweats, or change in appetite Eyes: No visual changes, double vision, eye pain Ear, nose and throat: No hearing loss, ear pain, nasal congestion, sore throat Cardiovascular: No chest pain, palpitations Respiratory:  No shortness of breath at rest or with exertion.   No wheezes GastrointestinaI: No nausea, vomiting, diarrhea, abdominal pain, fecal incontinence Genitourinary:  No dysuria, urinary retention or frequency.  No nocturia. Musculoskeletal:  No neck pain, back pain Integumentary: No rash, pruritus, skin lesions Neurological: as above Psychiatric: No depression at this time.  No anxiety Endocrine: No palpitations, diaphoresis, change in appetite, change in weigh or increased thirst Hematologic/Lymphatic:  No anemia, purpura, petechiae. Allergic/Immunologic: No itchy/runny eyes, nasal congestion, recent allergic reactions, rashes  ALLERGIES: Allergies[1]  HOME MEDICATIONS: Current Medications[2]  PAST MEDICAL HISTORY: Past Medical History:  Diagnosis Date   Anemia    Aneurysm    2 times   Chronic headaches    Diabetes mellitus    Diverticulosis of sigmoid colon 04/02/2008   Dr. Lamar Slack   Fibromyalgia    GERD (gastroesophageal reflux disease)    Glaucoma    Hypertension    Osteoarthritis    Ovarian cancer (HCC)    PONV (postoperative nausea and vomiting)    SBO (small bowel obstruction) (HCC) 06/2019    PAST SURGICAL HISTORY:  Past Surgical History:  Procedure Laterality Date   ABDOMINAL HYSTERECTOMY     ovarian cancer   BRAIN SURGERY     aneurysm   CATARACT EXTRACTION W/PHACO Left 07/11/2018   Procedure: CATARACT EXTRACTION PHACO AND INTRAOCULAR LENS PLACEMENT (IOC)  LEFT;  Surgeon: Myrna Adine Anes,  MD;  Location: [facility];  Service: Ophthalmology;  Laterality: Left;  Diabetic - diet controlled   CATARACT EXTRACTION W/PHACO Right 08/22/2018   Procedure: CATARACT EXTRACTION PHACO AND INTRAOCULAR LENS PLACEMENT (IOC) RIGHT;  Surgeon: Myrna Adine Anes, MD;  Location: [facility];  Service: Ophthalmology;  Laterality: Right;   CHOLECYSTECTOMY     COLONOSCOPY  04/02/2008   Dr. Lamar Slack, Jr.   COLONOSCOPY W/ BIOPSIES  09/09/1999   Dr. Lafaye Imam   CRANIECTOMY  08/10/2012   Procedure: CRANIECTOMY FOR TIC DOULOUREUX;  Surgeon: Lamar LELON Peaches, MD;  Location: [facility];  Service: Neurosurgery;  Laterality: Right;  Right Retromastoid Craniectomy with Microvascular Decompression, Decompression of the trigeminal nerve   ESOPHAGOGASTRODUODENOSCOPY  01/07/2012   Dr. Lamar Slack, [place].   HERNIA REPAIR     left inguinal repair and adhesion repair    FAMILY HISTORY: Family History  Problem Relation Age of Onset   Mesothelioma Sister    Bone cancer Sister    Cancer Brother        unknown   Colon cancer Neg Hx    Esophageal cancer Neg Hx     SOCIAL HISTORY: Social History   Socioeconomic History   Marital status: Married    Spouse name: Not on file   Number of children: 3   Years of education: Not on file   Highest education level: Not on file  Occupational History   Occupation: Printmaker: [employer]  Tobacco Use   Smoking status: Never   Smokeless tobacco: Never  Vaping Use   Vaping status: Never Used  Substance and Sexual Activity   Alcohol use: Yes    Comment: wine rarely   Drug use: No   Sexual activity: Not on file  Other Topics Concern   Not on file  Social History Narrative   Married, retired   Daughter-in-law CHARITY FUNDRAISER in [employer] endoscopy Scientist, Physiological)   Social Drivers of Health   Tobacco Use: Low Risk (12/13/2024)   Patient History    Smoking Tobacco Use: Never    Smokeless Tobacco Use: Never    Passive Exposure: Not on file   Financial Resource Strain: Low Risk (06/28/2024)   Received from [facility] of the [employer] Strain (CARDIA)    Difficulty of Paying Living Expenses: Not very hard  Food Insecurity: No Food Insecurity (06/28/2024)   Received from [facility] of the [place]   Epic    Within the past 12 months, you worried that your food would run out before you got the money to buy more.: Never true    Within the past 12 months, the food you bought just didn't last and you didn't have money to get more.: Never true  Transportation Needs: No Transportation Needs (06/28/2024)   Received from [place] of the [employer] - Transportation    Lack of Transportation (Medical): No    Lack of Transportation (Non-Medical): No  Physical Activity: Inactive (06/28/2024)   Received from [facility] of the [facility]   Exercise Vital Sign    On average, how many days per week do you engage in moderate to strenuous exercise (like a  brisk walk)?: 0 days    On average, how many minutes do you engage in exercise at this level?: 0 min  Stress: Stress Concern Present (06/28/2024)   Received from [facility] of the [employer] of Occupational Health - Occupational Stress Questionnaire    Feeling of Stress : To some extent  Social Connections: Socially Integrated (06/28/2024)   Received from [place] of the [employer] and Isolation Panel    In a typical week, how many times do you talk on the phone with family, friends, or neighbors?: More than three times a week    How often do you get together with friends or relatives?: More than three times a week    How often do you attend church or religious services?: More than 4 times per year    Do you belong to any clubs or organizations such as church groups, unions, fraternal or athletic groups, or school groups?: Yes    How often do you attend meetings of the clubs or organizations you belong to?: More  than 4 times per year    Are you married, widowed, divorced, separated, never married, or living with a partner?: Married  Intimate Partner Violence: Not At Risk (06/28/2024)   Received from [place] of the [place]   Epic    Within the last year, have you been afraid of your partner or ex-partner?: No    Within the last year, have you been humiliated or emotionally abused in other ways by your partner or ex-partner?: No    Within the last year, have you been kicked, hit, slapped, or otherwise physically hurt by your partner or ex-partner?: No    Within the last year, have you been raped or forced to have any kind of sexual activity by your partner or ex-partner?: No  Depression (PHQ2-9): Not on file  Alcohol Screen: Not on file  Housing: Low Risk (06/28/2024)   Received from [facility] of the [place]   Epic    In the last 12 months, was there a time when you were not able to pay the mortgage or rent on time?: No    In the past 12 months, how many times have you moved where you were living?: 1    At any time in the past 12 months, were you homeless or living in a shelter (including now)?: No  Utilities: Not At Risk (06/28/2024)   Received from [facility] of the [facility] Utilities    Threatened with loss of utilities: No  Health Literacy: Medium Risk (12/10/2021)   Received from [facility] Literacy    How often do you need to have someone help you when you read instructions, pamphlets, or other written material from your doctor or pharmacy?: Rarely       PHYSICAL EXAM  Vitals:   12/13/24 1434 12/13/24 1440  BP: (!) 162/76 (!) 144/72  Pulse: 88 84  SpO2:  97%  Weight: 203 lb (92.1 kg)   Height: 5' (1.524 m)     Body mass index is 39.65 kg/m.   General: The patient is well-developed and well-nourished and in no acute distress  HEENT:  Head is [place]/AT.  Sclera are anicteric.  Funduscopic exam shows normal optic discs and retinal vessels.  Neck: No  carotid bruits are noted.  The neck is nontender.  Cardiovascular: The heart has a regular rate and rhythm with a normal S1 and S2. There were no  murmurs, gallops or rubs.    Skin: Extremities are without rash or  edema.  Musculoskeletal:  Back is nontender  Neurologic Exam  Mental status: The patient is alert and oriented x 3 at the time of the examination. The patient has apparent normal recent and remote memory, with an apparently normal attention span and concentration ability.   Speech is normal.  Cranial nerves: Extraocular movements are full.  There is good facial sensation to soft touch bilaterally.Facial strength is normal.  Trapezius and sternocleidomastoid strength is normal. No dysarthria is noted.  The tongue is midline, and the patient has symmetric elevation of the soft palate. No obvious hearing deficits are noted.  Motor:  Muscle bulk is normal.   Tone is normal. Strength is  5 / 5 in all 4 extremities.   Sensory: Sensory testing is intact to pinprick, soft touch and vibration sensation in the arms.  Minimal numbness in the feet.  Coordination: Cerebellar testing reveals good finger-nose-finger and heel-to-shin bilaterally.  Gait and station: Station is normal.  Her gait has a reduced stride and she takes 5 steps to turn 180 degrees.  This could be normal for her age.. Romberg is negative.   Reflexes: Deep tendon reflexes are symmetric and normal bilaterally.   Plantar responses are flexor.    DIAGNOSTIC DATA (LABS, IMAGING, TESTING) - I reviewed patient records, labs, notes, testing and imaging myself where available.  Lab Results  Component Value Date   WBC 4.6 11/06/2024   HGB 8.4 Repeated and verified X2. (L) 11/06/2024   HCT 27.4 (L) 11/06/2024   MCV 73.1 (L) 11/06/2024   PLT 231.0 11/06/2024      Component Value Date/Time   NA 141 06/11/2019 0555   K 3.5 06/11/2019 0555   CL 110 06/11/2019 0555   CO2 23 06/11/2019 0555   GLUCOSE 116 (H) 06/11/2019  0555   BUN 13 06/11/2019 0555   CREATININE 0.83 06/11/2019 0555   CALCIUM 8.3 (L) 06/11/2019 0555   PROT 6.9 06/10/2019 1045   ALBUMIN 3.8 06/10/2019 1045   AST 23 06/10/2019 1045   ALT 19 06/10/2019 1045   ALKPHOS 90 06/10/2019 1045   BILITOT 0.7 06/10/2019 1045   GFRNONAA >60 06/11/2019 0555   GFRAA >60 06/11/2019 0555   No results found for: CHOL, HDL, LDLCALC, LDLDIRECT, TRIG, CHOLHDL Lab Results  Component Value Date   HGBA1C 5.9 (H) 06/10/2019   Lab Results  Component Value Date   VITAMINB12 159 (L) 11/06/2024   No results found for: TSH     ASSESSMENT AND PLAN  Cerebral aneurysm - Plan: MR ANGIO HEAD WO CONTRAST  Migraine without aura and without status migrainosus, not intractable  Trigeminal neuralgia of right side of face   She had successful coiling supraclinoid ICA aneurysm in 2012.  Additionally she has a small stable left ACA aneurysm.  We discussed that we check an MR angiogram every 2 to 3 years to ensure stability.  I will go ahead and order the MR angiogram.  She reports that she is good to be having an MRI of her lumbar spine at [facility] and she would like to do the MR angiogram there as well. She is otherwise stable. She will return to see me in about 2 years but is advised to call us  for new or worsening symptoms.  Merit Maybee A. Vear, MD, [facility] 12/13/2024, 8:37 PM Certified in Neurology, Clinical Neurophysiology, Sleep Medicine and Neuroimaging  [facility] [address] [place], [place] [address] [phone]     [1]  Allergies Allergen Reactions   Other Anxiety    Unable to tolerate NG tube insertion without sedation due to past experiences.   Morphine  And Codeine Other (See Comments)    Hallucinations, headaches   Cabbage Other (See Comments)    Congestion- nasal    Latex Dermatitis    (tape)   Mold Extract [Trichophyton] Other (See Comments)    Sinus congestion   Mushroom  Extract Complex (Obsolete) Other (See Comments)    Sinus congestion   Tape Dermatitis  [2]  Current Outpatient Medications:    acetaminophen  (TYLENOL ) 500 MG tablet, Take 500 mg by mouth every 6 (six) hours as needed (for pain). , Disp: , Rfl:    amLODipine  (NORVASC ) 5 MG tablet, Take 1 tablet by mouth daily., Disp: , Rfl:    amoxicillin  (AMOXIL ) 500 MG capsule, TAKE 4 CAPSULES 1 HOUR BEFORE DENTAL APPT, Disp: , Rfl:    cetirizine (ZYRTEC) 10 MG tablet, Take 10 mg by mouth at bedtime. , Disp: , Rfl:    dicyclomine (BENTYL) 10 MG capsule, Take 10 mg by mouth., Disp: , Rfl:    diphenhydrAMINE  (BENADRYL ) 25 MG tablet, Take 25 mg by mouth every 6 (six) hours as needed for allergies. , Disp: , Rfl:    ferrous sulfate ER (SLOW FE) 142 (45 Fe) MG TBCR tablet, Take 45 mg by mouth daily., Disp: , Rfl:    fluticasone  (FLONASE ) 50 MCG/ACT nasal spray, Place 1 spray into both nostrils daily. , Disp: , Rfl:    ibuprofen  (ADVIL ,MOTRIN ) 200 MG tablet, Take 200 mg by mouth every 6 (six) hours as needed for mild pain. , Disp: , Rfl:    latanoprost (XALATAN) 0.005 % ophthalmic solution, 1 drop., Disp: , Rfl:    magnesium  hydroxide (MILK OF MAGNESIA) 800 MG/5ML suspension, Take 15-30 mLs by mouth at bedtime. , Disp: , Rfl:    meloxicam (MOBIC) 7.5 MG tablet, Take 7.5 mg by mouth daily., Disp: , Rfl:    Multiple Vitamin (MULTIVITAMIN WITH MINERALS) TABS, Take 1 tablet by mouth daily., Disp: , Rfl:    pantoprazole  (PROTONIX ) 40 MG tablet, Take 40 mg by mouth at bedtime. , Disp: , Rfl:    psyllium (METAMUCIL) 58.6 % powder, Take 1 packet by mouth daily., Disp: , Rfl:   "

## 2024-12-13 ENCOUNTER — Ambulatory Visit: Admitting: Neurology

## 2024-12-13 ENCOUNTER — Encounter: Payer: Self-pay | Admitting: Neurology

## 2024-12-13 VITALS — BP 144/72 | HR 84 | Ht 60.0 in | Wt 203.0 lb

## 2024-12-13 DIAGNOSIS — I671 Cerebral aneurysm, nonruptured: Secondary | ICD-10-CM | POA: Diagnosis not present

## 2024-12-13 DIAGNOSIS — G43009 Migraine without aura, not intractable, without status migrainosus: Secondary | ICD-10-CM

## 2024-12-13 DIAGNOSIS — G500 Trigeminal neuralgia: Secondary | ICD-10-CM

## 2024-12-14 ENCOUNTER — Telehealth: Payer: Self-pay | Admitting: Neurology

## 2024-12-14 NOTE — Telephone Encounter (Signed)
 no auth required sent to [facility] in [place] per patient request. [phone]

## 2025-01-03 ENCOUNTER — Telehealth: Payer: Self-pay | Admitting: *Deleted

## 2025-01-03 ENCOUNTER — Ambulatory Visit: Admitting: Neurology

## 2025-01-03 NOTE — Telephone Encounter (Signed)
 Received MRA head results from [facility]. Results placed in Dr Duncan office for review. Also copied and pasted below from care everywhere.  FINDINGS: Punctate microhemorrhage within the left frontal lobe and the right frontal corona radiata (series 10 image 49 & image 48 respectively) as well as the right temporal lobe (10:29).  Susceptibility artifact within the region of the left supraclinoid ICA. No aneurysm is seen within the reported region of the left supraclinoid ICA which has been reportedly coiled. Recommend CT a for definitive evaluation if clinically indicated in light of significant artifact on this exam.  The bilateral ACA A1 segments are not well-visualized. There is a region of time-of-flight signal within the sella, favored artifactual in nature (series 3 image 66).  Global cerebral and cerebellar atrophy with ex vacuo dilatation of the CSF containing spaces. Multiple scattered and confluent T2 hyperintensities in the periventricular and deep white matter, nonspecific but commonly seen with small vessel ischemic changes. Enlarged perivascular spaces. Question small old lacunar infarcts within the cerebellar hemispheres versus atrophy.  There is no high grade stenosis.  IMPRESSION: Susceptibility artifact in the region of the left supraclinoid ICA, likely from prior aneurysm coiling. No evidence of residual or recurrent aneurysm in this region.  The bilateral ACA A1 segments are not well visualized and there is significant skull base artifact. If clinically concerned, consider contrast enhanced CTA of the head.  Likely artifactual low-level time-of-flight signal within the sella.  Small vessel disease and atrophy with prominent perivascular spaces. Multiple parenchymal microhemorrhages which could be related to amyloidosis. Dedicated brain MRI may be helpful if not already obtained. Exam End: 12/28/24 16:14   Specimen Collected: 12/28/24 16:17 Last Resulted: 12/28/24  17:02  Received From: [facility]  Result Received: 01/03/25 11:19

## 2025-01-12 ENCOUNTER — Telehealth: Payer: Self-pay | Admitting: Neurology

## 2025-01-12 NOTE — Telephone Encounter (Signed)
 Pt's daughter is asking to be called to discuss recent MRI results, she states pt is getting anxious for results although Dr Vear advised if no changes she would not be contacted, please call daughter.
